# Patient Record
Sex: Female | Born: 1950
Health system: Southern US, Community
[De-identification: ages and names within clinical notes are randomized; demographics above are authoritative.]

## PROBLEM LIST (undated history)

## (undated) DIAGNOSIS — I10 Essential (primary) hypertension: Secondary | ICD-10-CM

## (undated) DIAGNOSIS — D649 Anemia, unspecified: Secondary | ICD-10-CM

## (undated) DIAGNOSIS — Z9289 Personal history of other medical treatment: Secondary | ICD-10-CM

## (undated) DIAGNOSIS — D219 Benign neoplasm of connective and other soft tissue, unspecified: Secondary | ICD-10-CM

## (undated) DIAGNOSIS — F329 Major depressive disorder, single episode, unspecified: Secondary | ICD-10-CM

## (undated) DIAGNOSIS — G4730 Sleep apnea, unspecified: Secondary | ICD-10-CM

## (undated) DIAGNOSIS — R7303 Prediabetes: Secondary | ICD-10-CM

## (undated) DIAGNOSIS — F32A Depression, unspecified: Secondary | ICD-10-CM

## (undated) DIAGNOSIS — E049 Nontoxic goiter, unspecified: Secondary | ICD-10-CM

## (undated) HISTORY — PX: COLONOSCOPY: SHX174

## (undated) HISTORY — DX: Benign neoplasm of connective and other soft tissue, unspecified: D21.9

## (undated) HISTORY — PX: TUBAL LIGATION: SHX77

---

## 1999-02-09 ENCOUNTER — Other Ambulatory Visit: Admission: RE | Admit: 1999-02-09 | Discharge: 1999-02-09 | Payer: Self-pay | Admitting: Obstetrics and Gynecology

## 1999-04-02 ENCOUNTER — Encounter: Payer: Self-pay | Admitting: Obstetrics and Gynecology

## 1999-04-02 ENCOUNTER — Ambulatory Visit (HOSPITAL_COMMUNITY): Admission: RE | Admit: 1999-04-02 | Discharge: 1999-04-02 | Payer: Self-pay | Admitting: Obstetrics and Gynecology

## 2000-03-09 ENCOUNTER — Other Ambulatory Visit: Admission: RE | Admit: 2000-03-09 | Discharge: 2000-03-09 | Payer: Self-pay | Admitting: Obstetrics and Gynecology

## 2000-03-10 ENCOUNTER — Other Ambulatory Visit: Admission: RE | Admit: 2000-03-10 | Discharge: 2000-03-10 | Payer: Self-pay | Admitting: Obstetrics and Gynecology

## 2000-03-10 ENCOUNTER — Encounter (INDEPENDENT_AMBULATORY_CARE_PROVIDER_SITE_OTHER): Payer: Self-pay | Admitting: Specialist

## 2000-03-30 ENCOUNTER — Encounter: Payer: Self-pay | Admitting: Obstetrics and Gynecology

## 2000-03-30 ENCOUNTER — Ambulatory Visit (HOSPITAL_COMMUNITY): Admission: RE | Admit: 2000-03-30 | Discharge: 2000-03-30 | Payer: Self-pay | Admitting: Obstetrics and Gynecology

## 2000-05-12 ENCOUNTER — Encounter: Admission: RE | Admit: 2000-05-12 | Discharge: 2000-05-12 | Payer: Self-pay | Admitting: Internal Medicine

## 2000-05-13 ENCOUNTER — Inpatient Hospital Stay (HOSPITAL_COMMUNITY): Admission: RE | Admit: 2000-05-13 | Discharge: 2000-05-14 | Payer: Self-pay | Admitting: Internal Medicine

## 2000-05-13 ENCOUNTER — Encounter: Admission: RE | Admit: 2000-05-13 | Discharge: 2000-05-13 | Payer: Self-pay | Admitting: Internal Medicine

## 2000-06-23 ENCOUNTER — Encounter: Admission: RE | Admit: 2000-06-23 | Discharge: 2000-06-23 | Payer: Self-pay | Admitting: Internal Medicine

## 2000-06-27 ENCOUNTER — Ambulatory Visit (HOSPITAL_COMMUNITY): Admission: RE | Admit: 2000-06-27 | Discharge: 2000-06-27 | Payer: Self-pay | Admitting: Internal Medicine

## 2002-02-06 ENCOUNTER — Ambulatory Visit (HOSPITAL_COMMUNITY): Admission: RE | Admit: 2002-02-06 | Discharge: 2002-02-06 | Payer: Self-pay | Admitting: Obstetrics and Gynecology

## 2002-02-06 ENCOUNTER — Encounter: Payer: Self-pay | Admitting: Obstetrics and Gynecology

## 2002-02-12 ENCOUNTER — Other Ambulatory Visit: Admission: RE | Admit: 2002-02-12 | Discharge: 2002-02-12 | Payer: Self-pay | Admitting: Obstetrics and Gynecology

## 2002-04-23 ENCOUNTER — Encounter: Admission: RE | Admit: 2002-04-23 | Discharge: 2002-04-23 | Payer: Self-pay | Admitting: Internal Medicine

## 2002-04-25 ENCOUNTER — Ambulatory Visit (HOSPITAL_COMMUNITY): Admission: RE | Admit: 2002-04-25 | Discharge: 2002-04-25 | Payer: Self-pay | Admitting: Internal Medicine

## 2002-05-23 ENCOUNTER — Encounter: Admission: RE | Admit: 2002-05-23 | Discharge: 2002-05-23 | Payer: Self-pay | Admitting: Internal Medicine

## 2004-02-10 ENCOUNTER — Ambulatory Visit (HOSPITAL_COMMUNITY): Admission: RE | Admit: 2004-02-10 | Discharge: 2004-02-10 | Payer: Self-pay | Admitting: Obstetrics and Gynecology

## 2004-02-27 ENCOUNTER — Other Ambulatory Visit: Admission: RE | Admit: 2004-02-27 | Discharge: 2004-02-27 | Payer: Self-pay | Admitting: Obstetrics and Gynecology

## 2004-03-19 ENCOUNTER — Encounter (HOSPITAL_COMMUNITY): Admission: RE | Admit: 2004-03-19 | Discharge: 2004-06-17 | Payer: Self-pay | Admitting: Internal Medicine

## 2004-04-07 ENCOUNTER — Ambulatory Visit (HOSPITAL_COMMUNITY): Admission: RE | Admit: 2004-04-07 | Discharge: 2004-04-07 | Payer: Self-pay | Admitting: Internal Medicine

## 2004-04-15 ENCOUNTER — Encounter (INDEPENDENT_AMBULATORY_CARE_PROVIDER_SITE_OTHER): Payer: Self-pay | Admitting: *Deleted

## 2004-04-15 ENCOUNTER — Ambulatory Visit (HOSPITAL_COMMUNITY): Admission: RE | Admit: 2004-04-15 | Discharge: 2004-04-15 | Payer: Self-pay | Admitting: Internal Medicine

## 2004-06-26 ENCOUNTER — Ambulatory Visit (HOSPITAL_COMMUNITY): Admission: RE | Admit: 2004-06-26 | Discharge: 2004-06-26 | Payer: Self-pay | Admitting: Gastroenterology

## 2005-03-16 ENCOUNTER — Other Ambulatory Visit: Admission: RE | Admit: 2005-03-16 | Discharge: 2005-03-16 | Payer: Self-pay | Admitting: Obstetrics and Gynecology

## 2006-01-07 ENCOUNTER — Ambulatory Visit (HOSPITAL_COMMUNITY): Admission: RE | Admit: 2006-01-07 | Discharge: 2006-01-07 | Payer: Self-pay | Admitting: Obstetrics and Gynecology

## 2006-03-23 ENCOUNTER — Other Ambulatory Visit: Admission: RE | Admit: 2006-03-23 | Discharge: 2006-03-23 | Payer: Self-pay | Admitting: Obstetrics and Gynecology

## 2007-01-11 ENCOUNTER — Ambulatory Visit (HOSPITAL_COMMUNITY): Admission: RE | Admit: 2007-01-11 | Discharge: 2007-01-11 | Payer: Self-pay | Admitting: Obstetrics and Gynecology

## 2008-01-26 ENCOUNTER — Ambulatory Visit (HOSPITAL_COMMUNITY): Admission: RE | Admit: 2008-01-26 | Discharge: 2008-01-26 | Payer: Self-pay | Admitting: Obstetrics and Gynecology

## 2008-03-13 ENCOUNTER — Encounter: Admission: RE | Admit: 2008-03-13 | Discharge: 2008-03-13 | Payer: Self-pay | Admitting: Internal Medicine

## 2008-09-30 ENCOUNTER — Ambulatory Visit (HOSPITAL_COMMUNITY): Admission: RE | Admit: 2008-09-30 | Discharge: 2008-09-30 | Payer: Self-pay | Admitting: Otolaryngology

## 2008-10-22 ENCOUNTER — Ambulatory Visit (HOSPITAL_BASED_OUTPATIENT_CLINIC_OR_DEPARTMENT_OTHER): Admission: RE | Admit: 2008-10-22 | Discharge: 2008-10-22 | Payer: Self-pay | Admitting: Otolaryngology

## 2008-10-23 ENCOUNTER — Encounter: Admission: RE | Admit: 2008-10-23 | Discharge: 2008-10-23 | Payer: Self-pay | Admitting: Otolaryngology

## 2008-10-23 ENCOUNTER — Encounter (INDEPENDENT_AMBULATORY_CARE_PROVIDER_SITE_OTHER): Payer: Self-pay | Admitting: Interventional Radiology

## 2008-10-23 ENCOUNTER — Other Ambulatory Visit: Admission: RE | Admit: 2008-10-23 | Discharge: 2008-10-23 | Payer: Self-pay | Admitting: Interventional Radiology

## 2008-10-26 ENCOUNTER — Ambulatory Visit: Payer: Self-pay | Admitting: Internal Medicine

## 2009-01-28 ENCOUNTER — Ambulatory Visit (HOSPITAL_COMMUNITY): Admission: RE | Admit: 2009-01-28 | Discharge: 2009-01-28 | Payer: Self-pay | Admitting: Obstetrics and Gynecology

## 2010-01-29 ENCOUNTER — Ambulatory Visit (HOSPITAL_COMMUNITY): Admission: RE | Admit: 2010-01-29 | Discharge: 2010-01-29 | Payer: Self-pay | Admitting: Obstetrics and Gynecology

## 2011-02-19 ENCOUNTER — Other Ambulatory Visit (HOSPITAL_COMMUNITY): Payer: Self-pay | Admitting: Internal Medicine

## 2011-02-19 DIAGNOSIS — Z1231 Encounter for screening mammogram for malignant neoplasm of breast: Secondary | ICD-10-CM

## 2011-02-24 ENCOUNTER — Ambulatory Visit (HOSPITAL_COMMUNITY)
Admission: RE | Admit: 2011-02-24 | Discharge: 2011-02-24 | Disposition: A | Discharge status: Home / Self Care | Payer: BC Managed Care – PPO | Source: Ambulatory Visit | Attending: Internal Medicine | Admitting: Internal Medicine

## 2011-02-24 DIAGNOSIS — Z1231 Encounter for screening mammogram for malignant neoplasm of breast: Secondary | ICD-10-CM | POA: Insufficient documentation

## 2011-04-27 NOTE — Procedures (Signed)
NAME:  Linda Mccoy, Linda Mccoy NO.:  [id number]   MEDICAL RECORD NO.:  [id number]          PATIENT TYPE:  OUT   LOCATION:  SLEEP CENTER                 FACILITY:  [facility]   PHYSICIAN:  Clinton D. Maple Hudson, MD, FCCP, FACPDATE OF BIRTH:  11/27[birth year]1953   DATE OF STUDY:  10/22/2008                            NOCTURNAL POLYSOMNOGRAM   REFERRING PHYSICIAN:  Hermelinda Medicus, M.D.   REFERRING PHYSICIAN:  Hermelinda Medicus, MD   INDICATION FOR STUDY:  Hypersomnia with sleep apnea.   EPWORTH SLEEPINESS SCORE:  Epworth sleepiness score 5/24.  BMI 38.4.  Weight 245 pounds.  Height 67 inches.  Neck 16 inches.   MEDICATIONS:  Home medications are charted and reviewed.   SLEEP ARCHITECTURE:  Total sleep time 230 minutes with sleep efficiency  58.3%.  Stage I is 13.2%.  Stage II is 62.5%.  Stage III absent.  REM  24.3% of total sleep time.  Sleep latency is 18 minutes.  REM latency  278 minutes.  Her wake after sleep onset 147 minutes.  Arousal index  37.6.  Sleep was fragmented and sustained sleep was delayed  approximately 1/2 hour after sleep onset.  A sustained interval of  waking was noted between 1 and 2 a.m.  No bedtime medication was taken.   RESPIRATORY DATA:  Apnea-hypopnea index (AHI) 28.1 per hour.  A total of  108 events were scored including 32 of obstructive apneas and 76  hypopneas.  Events were not positional.  REM AHI 56.8 per hour.  This  was a diagnostic sleep study without CPAP titration requested.   OXYGEN DATA:  Moderate snoring with oxygen desaturation to a nadir of  79%.  Mean oxygen saturation through the study was 93.5% on room air.   CARDIAC DATA:  Sinus rhythm with occasional PAC.   MOVEMENT/PARASOMNIA:  No significant movement disturbance.  One bathroom  trip.   IMPRESSION/RECOMMENDATION:  1. Moderate obstructive sleep apnea/hypopnea syndrome, apnea-hypopnea      index 28.1 per hour with nonpositional events; moderate snoring;      and oxygen desaturation to  a nadir of 79%.  2. Scores in this range could be appropriately addressed by continuous      positive airway pressure under the right      circumstances.  Consider if continuous positive airway pressure      titration would be useful, otherwise manage as clinically      indicated.      Clinton D. Maple Hudson, MD, [facility], FACP  Diplomate, Biomedical engineer of Sleep Medicine  Electronically Signed     CDY/MEDQ  D:  10/26/2008 14:01:17  T:  10/27/2008 02:18:30  Job:  [id number]

## 2011-04-30 NOTE — Op Note (Signed)
NAME:  Linda Mccoy, Linda Mccoy                         ACCOUNT NO.:  [id number]   MEDICAL RECORD NO.:  [id number]                   PATIENT TYPE:  AMB   LOCATION:  ENDO                                 FACILITY:  [facility]   PHYSICIAN:  Anselmo Rod, M.D.               DATE OF BIRTH:  Feb 20, 1951   DATE OF PROCEDURE:  07/01/2004  DATE OF DISCHARGE:  06/26/2004                                 OPERATIVE REPORT   PROCEDURE:  Screening colonoscopy.   ENDOSCOPIST:  Charna Elizabeth, M.D.   INSTRUMENT USED:  Olympus video colonoscope.   INDICATIONS FOR PROCEDURE:  This is a 60 year old black female undergoing  screening colonoscopy for change in bowel habits.  Rule out colonic polyps,  masses, etc.   PREPROCEDURE PREPARATION:  Informed consent was procured from the patient.  The patient fasted for eight hours prior to the procedure and prepped with a  bottle of magnesium citrate and a gallon of GOLYTELY the night prior to the  procedure.   PREPROCEDURE PHYSICAL EXAMINATION:  VITAL SIGNS:  The patient had stable  vital signs.  NECK:  Supple.  CHEST:  Clear to auscultation.  HEART:  S1 and S2 regular.  ABDOMEN:  Soft with normal bowel sounds.   DESCRIPTION OF PROCEDURE:  The patient was placed in the left lateral  decubitus position, sedated with 88g of Demerol and 10 mg of Versed  intravenously.  Once the patient was adequately sedated and maintained on  low flow oxygen and continuous cardiac monitoring, the Olympus video  colonoscope was advanced from the rectum to the cecum.  The appendiceal  orifice and the ileocecal valve were visualized and photographed.  No  masses, polyps, erosions, ulcerations or diverticula were seen.  The patient  tolerated the procedure well without immediate complications.   IMPRESSION:  Normal colonoscopy to the cecum.  No masses, polyps or  diverticula were seen.   RECOMMENDATIONS:  1. Continue a high fiber diet with liberal fluid intake.  2. Outpatient followup  in the next 2 weeks to further discuss change in     bowel habits.  3. Repeat colonoscopy in the next 10 years unless the patient develops any     abnormal symptoms.                                               Anselmo Rod, M.D.    JNM/MEDQ  D:  07/01/2004  T:  07/02/2004  Job:  [id number]   cc:   Margaretmary Bayley, M.D.  [address], [address]  [place]  [place] [address]  Fax: [phone]

## 2012-02-29 ENCOUNTER — Other Ambulatory Visit: Payer: Self-pay | Admitting: Nurse Practitioner

## 2012-02-29 ENCOUNTER — Ambulatory Visit
Admission: RE | Admit: 2012-02-29 | Discharge: 2012-02-29 | Disposition: A | Discharge status: Home / Self Care | Payer: BC Managed Care – PPO | Source: Ambulatory Visit | Attending: Nurse Practitioner | Admitting: Nurse Practitioner

## 2012-02-29 DIAGNOSIS — R059 Cough, unspecified: Secondary | ICD-10-CM

## 2012-02-29 DIAGNOSIS — R05 Cough: Secondary | ICD-10-CM

## 2012-03-13 ENCOUNTER — Other Ambulatory Visit (HOSPITAL_COMMUNITY): Payer: Self-pay | Admitting: Internal Medicine

## 2012-03-13 DIAGNOSIS — Z1231 Encounter for screening mammogram for malignant neoplasm of breast: Secondary | ICD-10-CM

## 2012-03-28 ENCOUNTER — Ambulatory Visit
Admission: RE | Admit: 2012-03-28 | Discharge: 2012-03-28 | Disposition: A | Discharge status: Home / Self Care | Payer: BC Managed Care – PPO | Source: Ambulatory Visit | Attending: Nurse Practitioner | Admitting: Nurse Practitioner

## 2012-03-28 ENCOUNTER — Other Ambulatory Visit: Payer: Self-pay | Admitting: Nurse Practitioner

## 2012-03-28 DIAGNOSIS — R05 Cough: Secondary | ICD-10-CM

## 2012-03-28 DIAGNOSIS — R059 Cough, unspecified: Secondary | ICD-10-CM

## 2012-04-06 ENCOUNTER — Ambulatory Visit (HOSPITAL_COMMUNITY)
Admission: RE | Admit: 2012-04-06 | Discharge: 2012-04-06 | Disposition: A | Discharge status: Home / Self Care | Payer: BC Managed Care – PPO | Source: Ambulatory Visit | Attending: Internal Medicine | Admitting: Internal Medicine

## 2012-04-06 DIAGNOSIS — Z1231 Encounter for screening mammogram for malignant neoplasm of breast: Secondary | ICD-10-CM

## 2013-02-01 ENCOUNTER — Encounter: Payer: Self-pay | Admitting: Obstetrics and Gynecology

## 2013-02-05 ENCOUNTER — Encounter: Payer: Self-pay | Admitting: Obstetrics and Gynecology

## 2013-02-05 ENCOUNTER — Ambulatory Visit: Payer: BC Managed Care – PPO | Admitting: Obstetrics and Gynecology

## 2013-02-05 VITALS — BP 104/72 | Resp 16 | Wt 254.0 lb

## 2013-02-05 DIAGNOSIS — I10 Essential (primary) hypertension: Secondary | ICD-10-CM

## 2013-02-05 DIAGNOSIS — Z01419 Encounter for gynecological examination (general) (routine) without abnormal findings: Secondary | ICD-10-CM

## 2013-02-05 DIAGNOSIS — Z124 Encounter for screening for malignant neoplasm of cervix: Secondary | ICD-10-CM

## 2013-02-05 NOTE — Progress Notes (Signed)
Subjective:    Linda Mccoy is a 62 y.o. female [id number] who presents for annual exam. The patient complaints of stress at her job.  She can retire at age 66.  The following portions of the patient's history were reviewed and updated as appropriate: allergies, current medications, past family history, past medical history, past social history, past surgical history and problem list.  Review of Systems Pertinent items are noted in HPI. Gastrointestinal:No change in bowel habits, no abdominal pain, no rectal bleeding Genitourinary:negative for dysuria, frequency, hematuria, nocturia and urinary incontinence    Objective:     BP 104/72  Resp 16  Wt 254 lb (115.214 kg)  LMP 12/26/2010  Weight:  Wt Readings from Last 1 Encounters:  02/05/13 254 lb (115.214 kg)     BMI: There is no height on file to calculate BMI. General Appearance: Alert, appropriate appearance for age. No acute distress HEENT: Grossly normal Neck / Thyroid: Supple, no masses, nodes or enlargement Lungs: clear to auscultation bilaterally Back: No CVA tenderness Breast Exam: No masses or nodes.No dimpling, nipple retraction or discharge. Cardiovascular: Regular rate and rhythm. S1, S2, no murmur Gastrointestinal: Soft, non-tender, no masses or organomegaly  ++++++++++++++++++++++++++++++++++++++++++++++++++++++++  Pelvic Exam: External genitalia: normal general appearance Vaginal: normal without tenderness, induration or masses. Atrophic. Cervix: normal appearance Adnexa: normal bimanual exam Uterus: upper limits normal size, nontender, no masses Rectovaginal: normal rectal, no masses  ++++++++++++++++++++++++++++++++++++++++++++++++++++++++  Lymphatic Exam: Non-palpable nodes in neck, clavicular, axillary, or inguinal regions  Psychiatric: Alert and oriented, appropriate affect.      Assessment:    Normal gyn exam Menopause   Overweight or obese: Yes  Pelvic relaxation: No  Menopausal symptoms:  No. Severe: No.  hypertension   Plan:    Mammogram. Pap smear.   Follow-up:  for annual exam  The updated Pap smear screening guidelines were discussed with the patient. The patient requested that I obtain a Pap smear: Yes.  Kegel exercises discussed: No.  Proper diet and regular exercise were reviewed.  Annual mammograms recommended starting at age 23. Proper breast care was discussed.  Screening colonoscopy is recommended beginning at age 22.  Regular health maintenance was reviewed.  Sleep hygiene was discussed.  Adequate calcium and vitamin D intake was emphasized.  Leonard Schwartz M.D.   Regular Periods: no Mammogram: due in April   Monthly Breast Ex.: yes Exercise: yes "Walking"   Tetanus < 10 years: no Seatbelts: yes  NI. Bladder Functn.: no "Urgency"  Abuse at home: no  Daily BM's: yes Stressful Work: yes  Healthy Diet: no bread and sweets Sigmoid-Colonoscopy: "per pt almost 10 yrs ago.   Calcium: no Medical problems this year: None per pt.    LAST PAP:04/07/2009 'WNL"   Contraception: None   Mammogram:  04/07/2012  PCP: [name], MD   PMH: No Changes  FMH: No Changes  Last Bone Scan: Done w/ [name] pt cannot recall when "WNL"

## 2013-02-06 LAB — PAP IG W/ RFLX HPV ASCU

## 2013-03-07 ENCOUNTER — Other Ambulatory Visit (HOSPITAL_COMMUNITY): Payer: Self-pay | Admitting: Internal Medicine

## 2013-03-07 DIAGNOSIS — Z1231 Encounter for screening mammogram for malignant neoplasm of breast: Secondary | ICD-10-CM

## 2013-03-09 ENCOUNTER — Ambulatory Visit (HOSPITAL_COMMUNITY): Payer: BC Managed Care – PPO

## 2013-04-09 ENCOUNTER — Ambulatory Visit (HOSPITAL_COMMUNITY)
Admission: RE | Admit: 2013-04-09 | Discharge: 2013-04-09 | Disposition: A | Discharge status: Home / Self Care | Payer: BC Managed Care – PPO | Source: Ambulatory Visit | Attending: Internal Medicine | Admitting: Internal Medicine

## 2013-04-09 DIAGNOSIS — Z1231 Encounter for screening mammogram for malignant neoplasm of breast: Secondary | ICD-10-CM

## 2013-08-13 ENCOUNTER — Emergency Department (HOSPITAL_COMMUNITY): Payer: BC Managed Care – PPO

## 2013-08-13 ENCOUNTER — Emergency Department (HOSPITAL_COMMUNITY)
Admission: EM | Admit: 2013-08-13 | Discharge: 2013-08-13 | Disposition: A | Discharge status: Home / Self Care | Payer: BC Managed Care – PPO | Attending: Emergency Medicine | Admitting: Emergency Medicine

## 2013-08-13 ENCOUNTER — Encounter (HOSPITAL_COMMUNITY): Payer: Self-pay | Admitting: *Deleted

## 2013-08-13 DIAGNOSIS — R209 Unspecified disturbances of skin sensation: Secondary | ICD-10-CM | POA: Insufficient documentation

## 2013-08-13 DIAGNOSIS — R202 Paresthesia of skin: Secondary | ICD-10-CM

## 2013-08-13 DIAGNOSIS — I10 Essential (primary) hypertension: Secondary | ICD-10-CM | POA: Insufficient documentation

## 2013-08-13 DIAGNOSIS — R112 Nausea with vomiting, unspecified: Secondary | ICD-10-CM | POA: Insufficient documentation

## 2013-08-13 DIAGNOSIS — R413 Other amnesia: Secondary | ICD-10-CM | POA: Insufficient documentation

## 2013-08-13 DIAGNOSIS — R110 Nausea: Secondary | ICD-10-CM

## 2013-08-13 DIAGNOSIS — R55 Syncope and collapse: Secondary | ICD-10-CM | POA: Insufficient documentation

## 2013-08-13 DIAGNOSIS — Z8249 Family history of ischemic heart disease and other diseases of the circulatory system: Secondary | ICD-10-CM | POA: Insufficient documentation

## 2013-08-13 DIAGNOSIS — Z8742 Personal history of other diseases of the female genital tract: Secondary | ICD-10-CM | POA: Insufficient documentation

## 2013-08-13 DIAGNOSIS — Z880 Allergy status to penicillin: Secondary | ICD-10-CM | POA: Insufficient documentation

## 2013-08-13 DIAGNOSIS — E119 Type 2 diabetes mellitus without complications: Secondary | ICD-10-CM | POA: Insufficient documentation

## 2013-08-13 DIAGNOSIS — Q054 Unspecified spina bifida with hydrocephalus: Secondary | ICD-10-CM | POA: Insufficient documentation

## 2013-08-13 DIAGNOSIS — D649 Anemia, unspecified: Secondary | ICD-10-CM | POA: Insufficient documentation

## 2013-08-13 DIAGNOSIS — R42 Dizziness and giddiness: Secondary | ICD-10-CM | POA: Insufficient documentation

## 2013-08-13 DIAGNOSIS — IMO0002 Reserved for concepts with insufficient information to code with codable children: Secondary | ICD-10-CM

## 2013-08-13 DIAGNOSIS — Z79899 Other long term (current) drug therapy: Secondary | ICD-10-CM | POA: Insufficient documentation

## 2013-08-13 DIAGNOSIS — Z7982 Long term (current) use of aspirin: Secondary | ICD-10-CM | POA: Insufficient documentation

## 2013-08-13 HISTORY — DX: Essential (primary) hypertension: I10

## 2013-08-13 LAB — URINALYSIS, ROUTINE W REFLEX MICROSCOPIC
Nitrite: NEGATIVE
Specific Gravity, Urine: 1.019 (ref 1.005–1.030)
Urobilinogen, UA: 0.2 mg/dL (ref 0.0–1.0)

## 2013-08-13 LAB — COMPREHENSIVE METABOLIC PANEL
ALT: 14 U/L (ref 0–35)
Albumin: 3.5 g/dL (ref 3.5–5.2)
Calcium: 9.6 mg/dL (ref 8.4–10.5)
GFR calc Af Amer: >90 mL/min (ref >90)
Glucose, Bld: 110 mg/dL — ABNORMAL HIGH (ref 70–99)
Sodium: 137 mEq/L (ref 135–145)
Total Protein: 8.1 g/dL (ref 6.0–8.3)

## 2013-08-13 LAB — CBC
HCT: 39.2 % (ref 36.0–46.0)
MCH: 20.2 pg — ABNORMAL LOW (ref 26.0–34.0)
MCHC: 33.2 g/dL (ref 30.0–36.0)
MCV: 61.1 fL — ABNORMAL LOW (ref 78.0–100.0)
Platelets: 274 10*3/uL (ref 150–400)
RDW: 18.1 % — ABNORMAL HIGH (ref 11.5–15.5)

## 2013-08-13 LAB — LIPASE, BLOOD: Lipase: 16 U/L (ref 11–59)

## 2013-08-13 LAB — POCT I-STAT TROPONIN I: Troponin i, poc: 0 ng/mL (ref 0.00–0.08)

## 2013-08-13 MED ORDER — MECLIZINE HCL 25 MG PO TABS
25.0000 mg | ORAL_TABLET | Freq: Once | ORAL | Status: AC
  Administered 2013-08-13: 25 mg via ORAL
  Filled 2013-08-13: qty 1

## 2013-08-13 MED ORDER — SODIUM CHLORIDE 0.9 % IV SOLN
Freq: Once | INTRAVENOUS | Status: AC
  Administered 2013-08-13: 10:00:00 via INTRAVENOUS

## 2013-08-13 MED ORDER — MECLIZINE HCL 25 MG PO TABS: 25.0000 mg | ORAL_TABLET | Freq: Four times a day (QID) | ORAL | Status: DC

## 2013-08-13 MED ORDER — ONDANSETRON HCL 4 MG/2ML IJ SOLN
4.0000 mg | Freq: Once | INTRAMUSCULAR | Status: AC
  Administered 2013-08-13: 4 mg via INTRAVENOUS
  Filled 2013-08-13: qty 2

## 2013-08-13 NOTE — ED Provider Notes (Signed)
CSN: [id number]     Arrival date & time 08/13/13  0912 History   First MD Initiated Contact with Patient 08/13/13 [phone]     No chief complaint on file.  (Consider location/radiation/quality/duration/timing/severity/associated sxs/prior Treatment) The history is provided by the patient. No language interpreter was used.    Linda Mccoy is a 62 year old female with a past medical history of hypertension, anemia, fibroids, borderline diabetic with a positive family history of stroke and heart attack presents today with chief complaint of nausea, vertiginous symptoms and left arm paresthesia since Friday.  Patient states that Friday she came home and ate dinner.  She had amnesia immediate nausea and left arm and tingling. She denies any chest pain, shortness of breath, diaphoresis.  Patient states that her left arm tingling has been constant since onset Friday (3 days ago), however the nausea has been somewhat intermittent.  She states that her left arm paresthesia is worse when lying flat, she also endorses vertiginous symptoms when lying flat with room spinning.  She has lightheadedness and presyncopal feelings when standing.  Her symptoms are relieved with exertion and movement.  She denies a history of neck problems.  She has had multiple episodes of vomiting nonbloody nonbilious vomitus. Denies headache, visual disturbance, unilateral weakness, facial asymmetry, difficulty with speech, change in gait, difficulty swallowing, confusion. The patient is mildly nauseated today and continues to have paresthesia. Denies fevers, chills, myalgias, arthralgias.Denies dysuria, flank pain, suprapubic pain, frequency, urgency, or hematuria.  Denies abdominal pain,diarrhea or constipation.   Past Medical History  Diagnosis Date  . Fibroids   . Hypertension    Past Surgical History  Procedure Laterality Date  . Tubal ligation     Family History  Problem Relation Age of Onset  . Diabetes Mother   .  Hypertension Mother   . Alcohol abuse Mother    History  Substance Use Topics  . Smoking status: Never Smoker   . Smokeless tobacco: Not on file  . Alcohol Use: Yes     Comment: socially    OB History   Grav Para Term Preterm Abortions TAB SAB Ect Mult Living   3 3 3       3      Review of Systems Ten systems reviewed and are negative for acute change, except as noted in the HPI.    Allergies  Penicillins  Home Medications   Current Outpatient Rx  Name  Route  Sig  Dispense  Refill  . aspirin EC 81 MG tablet   Oral   Take 81 mg by mouth daily.         Marland Kitchen lisinopril-hydrochlorothiazide (PRINZIDE,ZESTORETIC) 20-25 MG per tablet   Oral   Take 1 tablet by mouth daily.          BP 142/78  Pulse 79  Temp(Src) 98.2 F (36.8 C) (Oral)  Resp 18  SpO2 100%  LMP 12/26/2010 Physical Exam Physical Exam  Nursing note and vitals reviewed. Constitutional: She is oriented to person, place, and time. She appears well-developed and well-nourished. No distress.  HENT:  Head: Normocephalic and atraumatic.  Eyes: Conjunctivae normal and EOM are normal. Pupils are equal, round, and reactive to light. No scleral icterus.  Neck: Normal range of motion.  Markedly swollen thyroid gland. Cardiovascular: Normal rate, regular rhythm and normal heart sounds.  Exam reveals no gallop and no friction rub.   No murmur heard. Pulmonary/Chest: Effort normal and breath sounds normal. No respiratory distress.  Abdominal: Soft. Bowel sounds are  normal. She exhibits no distension and no mass. There is no tenderness. There is no guarding.  Neurological: She is alert and oriented to person, place, and time.  Speech is clear and goal oriented, follows commands Major Cranial nerves without deficit, no facial droop Normal strength in upper and lower extremities bilaterally including dorsiflexion and plantar flexion, strong and equal grip strength Sensation normal to light and sharp touch Moves  extremities without ataxia, coordination intact Normal finger to nose and rapid alternating movements Skin: Skin is warm and dry. She is not diaphoretic.    ED Course  Procedures (including critical care time) Labs Review Labs Reviewed  CBC  COMPREHENSIVE METABOLIC PANEL  LIPASE, BLOOD  URINALYSIS, ROUTINE W REFLEX MICROSCOPIC  POCT I-STAT TROPONIN I   Imaging Review Dg Abd Acute W/chest  08/13/2013   *RADIOLOGY REPORT*  Clinical Data: Nausea and vomiting.  ACUTE ABDOMEN SERIES (ABDOMEN 2 VIEW & CHEST 1 VIEW)  Comparison: PA and lateral chest 03/28/2012.  Findings: Single view of the chest demonstrates clear lungs and normal heart size.  Prominent epicardial fat at the right cardiophrenic angle is noted.  Two views of the abdomen show no free intraperitoneal air.  The bowel gas pattern is normal.  No abnormal abdominal calcification is identified.  No focal bony abnormality.  IMPRESSION: Negative exam.   Original Report Authenticated By: Holley Dexter, M.D.    MDM  No diagnosis found. 10:24 AM Filed Vitals:   08/13/13 0919  BP: 142/78  Pulse: 79  Temp: 98.2 F (36.8 C)  Resp: 18   Patient with symptoms concerning for probable possible acute coronary syndrome versus stroke.  She is markedly thyromegaly.  Review of chart shows fine needle aspiration done in 2009 that showed hyperplastic nodules.  Patient states she has had her thyroid checked by her primary care physician and does not have any thyroid disease.  Her ABCD to risk score is 3 point pending at at moderate risk (3 points;According to the validation study, 0-3 points: Low Risk.nn2-Day Stroke Risk: 1.0%., 7-Day Stroke Risk: 1.2%.,90-Day Stroke Risk: 3.1%.) Patient will be given Zofran for pain.  X-ray is negative for any acute abnormality.  Personally reviewed images using our PACs system.  EKG on concerning for acute ischemia.  She does have some T-wave abnormalities and no previous EKG to compare.  Patient has had ongoing  symptoms since the past 3 days however her first troponin is negative which makes my suspicion for acute coronary syndrome low     I have spoken with Dr. Thad Ranger regarding the patient who states that the patient is a "stroke until proven otherwise." due to her risk factors. In repeat exam her paresthesias have resolved, however she still c/o vertiginous sxs. Patient CT negative for acute abnormality. i have ordered MRI .   Patient MRI shows Chiari malforamtion without any other acute abnormality or stroke.  On repeat neuro exam the patient still has no focal neuro deficits. She does continue to c/o dizziness. I feel the patient is safe for discharge and may have outpatient workup for TIA. D.c with meclizine.  The patient appears reasonably screened and/or stabilized for discharge and I doubt any other medical condition or other [facility] requiring further screening, evaluation, or treatment in the ED at this time prior to discharge.   Arthor Captain, PA-C 08/13/13 1921

## 2013-08-13 NOTE — ED Notes (Signed)
Pt knows we need a urine sample but is unable at this time 

## 2013-08-13 NOTE — ED Notes (Signed)
Pt states that she thinks she had panic attack on Friday and has been in bed for 2 days.  Pt reports tingling in left arm and nauseated.  No chest pain.

## 2013-08-13 NOTE — ED Notes (Signed)
Pt reporting dizziness after ambulating to restroom.

## 2013-08-13 NOTE — ED Notes (Addendum)
Meal ordred from patient.  Pt is aware she is going to mri in about an hour. She denies having any metal in her body. She denies having a problem with tight places

## 2013-08-13 NOTE — ED Notes (Signed)
Pt ambulated to restroom with no difficulty.

## 2013-08-13 NOTE — ED Notes (Signed)
Patient transported to X-ray 

## 2013-08-14 LAB — URINE CULTURE: Colony Count: 90000

## 2013-08-16 NOTE — ED Provider Notes (Signed)
  Medical screening examination/treatment/procedure(s) were performed by non-physician practitioner and as supervising physician I was immediately available for consultation/collaboration.    Gerhard Munch, MD 08/16/13 [phone]

## 2013-09-03 ENCOUNTER — Ambulatory Visit (INDEPENDENT_AMBULATORY_CARE_PROVIDER_SITE_OTHER): Payer: BC Managed Care – PPO | Admitting: Neurology

## 2013-09-03 ENCOUNTER — Encounter: Payer: Self-pay | Admitting: Neurology

## 2013-09-03 VITALS — BP 116/78 | HR 84 | Temp 98.0°F | Ht 66.5 in | Wt 251.0 lb

## 2013-09-03 DIAGNOSIS — G459 Transient cerebral ischemic attack, unspecified: Secondary | ICD-10-CM

## 2013-09-03 LAB — LIPID PANEL: Total CHOL/HDL Ratio: 2

## 2013-09-03 NOTE — Progress Notes (Addendum)
NEUROLOGY CONSULTATION NOTE  Linda Mccoy MRN: [id number] DOB: 08-23-1951  Referring provider: ED referral Primary care provider: Dr. Renae Gloss  Reason for consult:  Transient left arm numbness and vertigo  HISTORY OF PRESENT ILLNESS: Linda Mccoy is a 62 year old left-handed woman with a past medical history of thyromegaly, hypertension, borderline diabetes, anemia and fibroid, who presents for evaluation of left upper extremity paresthesia.  Records and images were personally reviewed where available.    She has longstanding history of spinning sensation when laying supine (not when head turned on either side).  However, three weeks ago, she had worsening of vertigo, along with nausea, lightheadedness when she stood up, and numbness and heaviness of the left arm.  She denied headache, neck pain, visual disturbance, dysphagia, dysarthria, or gait instability.When symptoms didn't improve 2-3 days later, she presented to the ED on 08/13/13.  In the ED, an acute abdomen series was performed, which was normal.  Basic labs were not revealing.  ECG revealed normal sinus rhythm.  CT Heads was unremarkable.  MRI of brain was performed, which revealed a Chiari malformation, with cerebellar tonsils extending 10 mm below foramen magnum with mild impaction.  No stroke.  She was given meclizine and discharged home.  The symptoms of severe vertigo, nausea and left arm numbness soon resolved.  She is back at baseline, still with constant vertigo when she lays supine.  She denies personal history of migraines.  She does have history of thyromegaly and had a fine needle aspiration performed in 2009, which revealed hypoplastic nodules.  It is checked by her PCP.  She has a family history of stroke and heart attack.  She endorses that her diet isn't great and she doesn't exercise.  She does not smoke.  PAST MEDICAL HISTORY: Past Medical History  Diagnosis Date  . Fibroids   . Hypertension     PAST SURGICAL  HISTORY: Past Surgical History  Procedure Laterality Date  . Tubal ligation      MEDICATIONS: Current Outpatient Prescriptions on File Prior to Visit  Medication Sig Dispense Refill  . aspirin EC 81 MG tablet Take 81 mg by mouth daily.      Marland Kitchen lisinopril-hydrochlorothiazide (PRINZIDE,ZESTORETIC) 20-25 MG per tablet Take 1 tablet by mouth daily.      . meclizine (ANTIVERT) 25 MG tablet Take 1 tablet (25 mg total) by mouth 4 (four) times daily.  28 tablet  0   No current facility-administered medications on file prior to visit.    ALLERGIES: Allergies  Allergen Reactions  . Penicillins Hives    FAMILY HISTORY: Family History  Problem Relation Age of Onset  . Diabetes Mother   . Hypertension Mother   . Alcohol abuse Mother     SOCIAL HISTORY: History   Social History  . Marital Status: Widowed    Spouse Name: N/A    Number of Children: N/A  . Years of Education: N/A   Occupational History  . Not on file.   Social History Main Topics  . Smoking status: Never Smoker   . Smokeless tobacco: Never Used  . Alcohol Use: Yes     Comment: socially   . Drug Use: No  . Sexual Activity: Not Currently    Partners: Male    Birth Control/ Protection: Post-menopausal, Surgical     Comment: BTL    Other Topics Concern  . Not on file   Social History Narrative  . No narrative on file    REVIEW OF SYSTEMS:  Constitutional: No fevers, chills, or sweats, no generalized fatigue, change in appetite Eyes: No visual changes, double vision, eye pain Ear, nose and throat: No hearing loss, ear pain, nasal congestion, sore throat Cardiovascular: No chest pain, palpitations Respiratory:  No shortness of breath at rest or with exertion, wheezes GastrointestinaI: No nausea, vomiting, diarrhea, abdominal pain, fecal incontinence Genitourinary:  No dysuria, urinary retention or frequency Musculoskeletal:  No neck pain, back pain Integumentary: No rash, pruritus, skin  lesions Neurological: as above Psychiatric: No depression, insomnia, anxiety Endocrine: No palpitations, fatigue, diaphoresis, mood swings, change in appetite, change in weight, increased thirst Hematologic/Lymphatic:  No anemia, purpura, petechiae. Allergic/Immunologic: no itchy/runny eyes, nasal congestion, recent allergic reactions, rashes  PHYSICAL EXAM: Filed Vitals:   09/03/13 0746  BP: 116/78  Pulse: 84  Temp: 98 F (36.7 C)   General: No acute distress Head:  Normocephalic/atraumatic Neck: supple, no paraspinal tenderness, full range of motion Back: No paraspinal tenderness Heart: regular rate and rhythm Lungs: Clear to auscultation bilaterally. Vascular: No carotid bruits. Neurological Exam: Mental status: alert and oriented to person, place, and time, speech fluent and not dysarthric, language intact. Cranial nerves: CN I: not tested CN II: pupils equal, round and reactive to light, visual fields intact, fundi unremarkable. CN III, IV, VI:  full range of motion, no nystagmus, no ptosis CN V: facial sensation intact CN VII: upper and lower face symmetric CN VIII: hearing intact CN IX, X: gag intact, uvula midline CN XI: sternocleidomastoid and trapezius muscles intact CN XII: tongue midline Bulk & Tone: normal, no fasciculations. Motor: 5/5 throughout Sensation: pinprick and vibration intact Deep Tendon Reflexes: 1+ throughout except absent in ankles, toes down Finger to nose testing: normal without dysmetria Heel to shin: normal without dysmetria Gait: mild waddling gait but no ataxia.  Able to walk in tandem. Romberg negative.  IMPRESSION & PLAN: 1.  Transient episode of increased vertigo and left arm numbness.  Possible transient ischemic attack 2.  Chiari malformation.  Likely asymptomatic and not contributing to symptoms, given her symptoms were isolated and transient.  -  Will continue asa 81mg  daily.  Option was to switch to Plavix, but this would be only  a lateral change, as Plavix has not been shown to be significantly more beneficial than aspirin in secondary stroke prevention, especially when taking into account increased risk for bleeding.  Changing to full-strength ASA has not shown to be significantly more beneficial than 81mg .  She wishes to remain on ASA 81mg  daily. -  Check 2D Echo -  Carotid doppler -  Fasting lipid panel (LDL goal should be <100) and Hgb A1c. -  Encouraged regular exercise -  Mediterranean diet -  Follow up in 4 weeks to review results  Thank you for allowing me to take part in the care of this patient.  Shon Millet, DO  CC:  Andi Devon, MD

## 2013-09-03 NOTE — Patient Instructions (Addendum)
You probably had a mild stroke, that was missed by MRI (no actual stroke seen). 1.  Continue aspirin 81mg  daily 2.  Echocardiogram 4.  Carotid doppler 5.  We will check cholesterol and blood sugar 6.  Important to maintain blood pressure control 7.  Regular exercise 8.  Mediterranean diet (fish, vegetables, grains).   9.  Follow up in one month.  Your carotid doppler study will be done at [facility] located at [facility] in [place]; third floor.  Your appointment is Wednesday, September 24th at 1:30 pm. Please arrive at 1:15 pm.   [phone].  Your 2 D Echo is scheduled at [facility] on Thursday, September 25th at 10:00 am.  Please arrive at first floor radiology fifteen minutes prior to your appointment.  Enter the hospital at Entrance A off of [address].   [phone]

## 2013-09-06 ENCOUNTER — Ambulatory Visit (HOSPITAL_COMMUNITY)
Admission: RE | Admit: 2013-09-06 | Discharge: 2013-09-06 | Disposition: A | Discharge status: Home / Self Care | Payer: BC Managed Care – PPO | Source: Ambulatory Visit | Attending: Neurology | Admitting: Neurology

## 2013-09-06 DIAGNOSIS — I379 Nonrheumatic pulmonary valve disorder, unspecified: Secondary | ICD-10-CM | POA: Insufficient documentation

## 2013-09-06 DIAGNOSIS — I10 Essential (primary) hypertension: Secondary | ICD-10-CM

## 2013-09-06 DIAGNOSIS — I059 Rheumatic mitral valve disease, unspecified: Secondary | ICD-10-CM | POA: Insufficient documentation

## 2013-09-06 DIAGNOSIS — I079 Rheumatic tricuspid valve disease, unspecified: Secondary | ICD-10-CM | POA: Insufficient documentation

## 2013-09-06 DIAGNOSIS — G459 Transient cerebral ischemic attack, unspecified: Secondary | ICD-10-CM | POA: Insufficient documentation

## 2013-09-10 ENCOUNTER — Encounter (INDEPENDENT_AMBULATORY_CARE_PROVIDER_SITE_OTHER): Payer: BC Managed Care – PPO

## 2013-09-10 DIAGNOSIS — R42 Dizziness and giddiness: Secondary | ICD-10-CM

## 2013-09-10 DIAGNOSIS — I6529 Occlusion and stenosis of unspecified carotid artery: Secondary | ICD-10-CM

## 2013-09-10 DIAGNOSIS — R209 Unspecified disturbances of skin sensation: Secondary | ICD-10-CM

## 2013-09-10 DIAGNOSIS — G459 Transient cerebral ischemic attack, unspecified: Secondary | ICD-10-CM

## 2013-09-12 ENCOUNTER — Telehealth: Payer: Self-pay | Admitting: Neurology

## 2013-09-12 NOTE — Telephone Encounter (Signed)
Message copied by Benay Spice on Wed Sep 12, 2013  3:42 PM ------      Message from: JAFFE, ADAM R      Created: Tue Sep 11, 2013 12:38 PM       Carotid doppler does not show any significant blockage of the carotid arteries.      ----- Message -----         From: Antonieta Iba, MD         Sent: 09/10/2013   6:16 PM           To: Cira Servant, DO                   ------

## 2013-09-12 NOTE — Telephone Encounter (Signed)
Spoke with the patient and informed of carotid doppler results as written below. No questions or concerns voiced at this time.

## 2013-10-09 ENCOUNTER — Telehealth: Payer: Self-pay | Admitting: Neurology

## 2013-10-09 NOTE — Telephone Encounter (Signed)
Pt was scheduled for a follow up w/ Dr. Everlena Cooper on 10/11/13. Pt cancelled appt stating she is feeling better / Roanna Raider

## 2013-10-11 ENCOUNTER — Ambulatory Visit: Payer: BC Managed Care – PPO | Admitting: Neurology

## 2013-11-05 IMAGING — CR DG CHEST 2V
2 series · 2 of 2 positions shown · non-contrast
Comparison: None.

CLINICAL DATA: Cough, shortness of breath, blood in sputum

CHEST - 2 VIEW

[w chest pa]
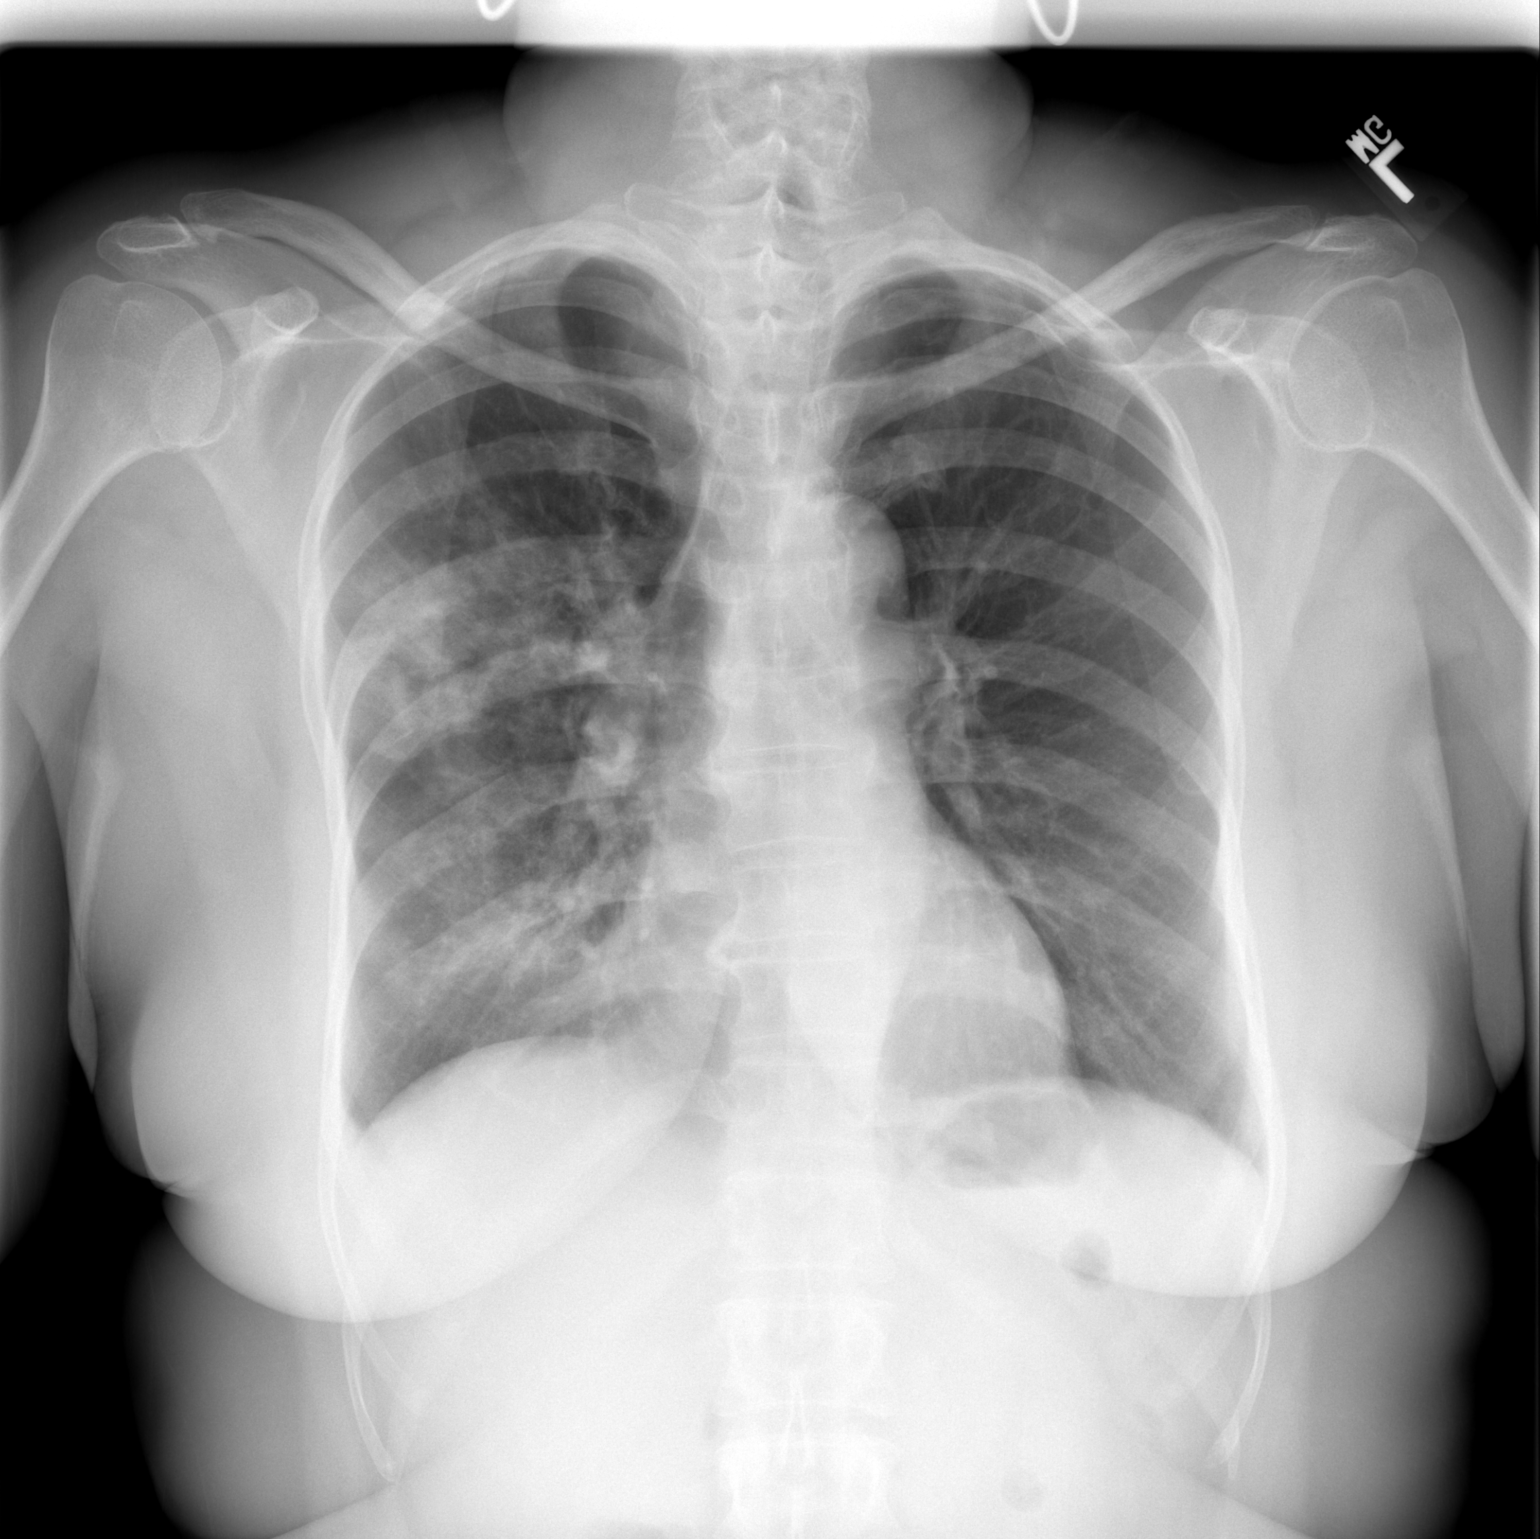

[w chest lat]
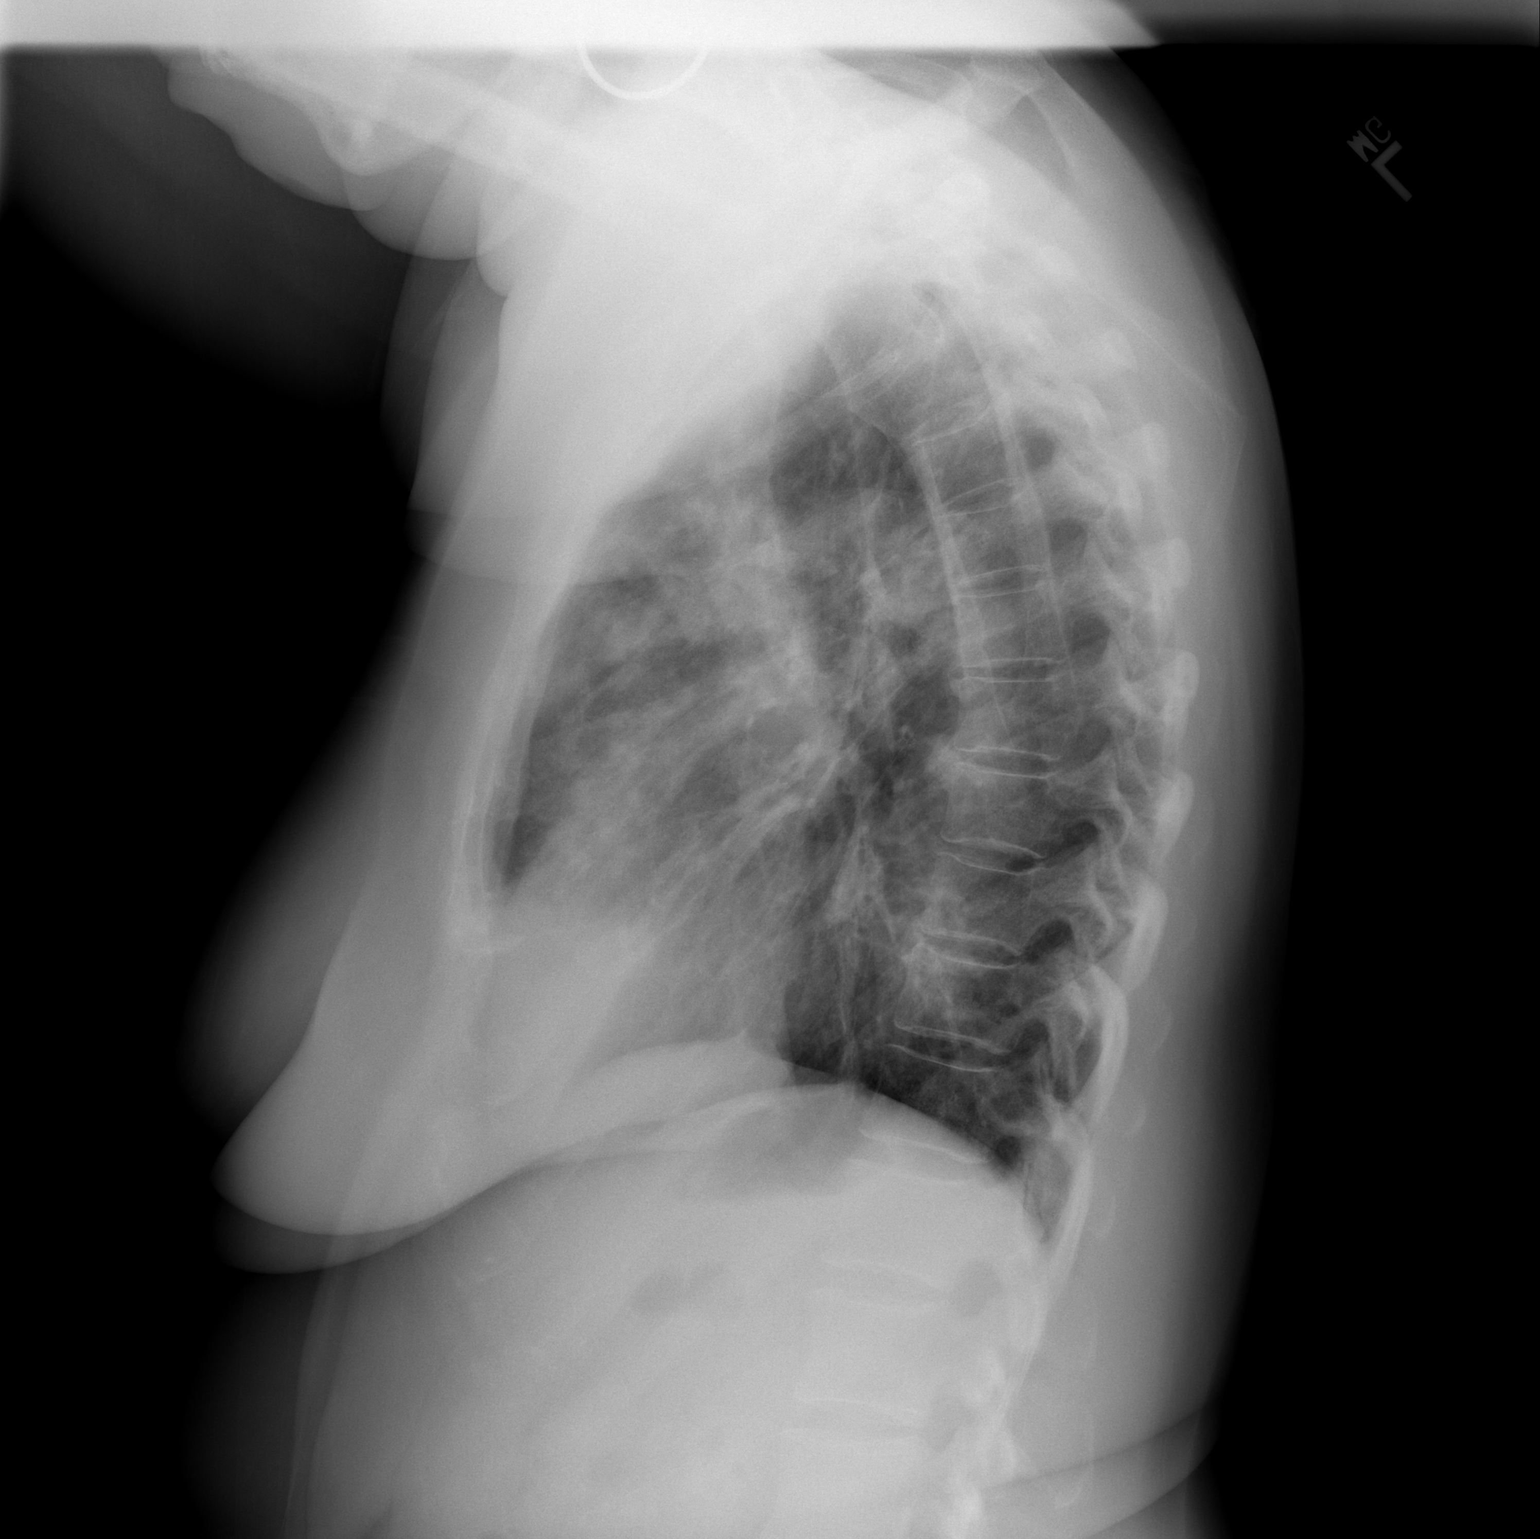

[2 of 2 positions shown; findings below may reference images not displayed]

FINDINGS: There is parenchymal opacity in the anterior right upper
lobe and possibly in the right middle lobe most consistent with
pneumonia.  Follow-up chest x-ray is recommended to ensure
clearing.  The left lung is clear.  Mediastinal contours are
normal.  The heart is within normal limits in size.  There are
degenerative changes in the lower thoracic spine.
IMPRESSION: Right upper lobe and possibly right middle lobe pneumonia.
Recommend follow-up chest x-ray to ensure clearing.

## 2014-05-21 ENCOUNTER — Other Ambulatory Visit: Payer: Self-pay | Admitting: Obstetrics and Gynecology

## 2014-05-22 ENCOUNTER — Encounter (HOSPITAL_COMMUNITY): Payer: Self-pay | Admitting: Pharmacy Technician

## 2014-05-23 ENCOUNTER — Encounter (HOSPITAL_COMMUNITY): Payer: Self-pay

## 2014-05-23 ENCOUNTER — Encounter (HOSPITAL_COMMUNITY)
Admission: RE | Admit: 2014-05-23 | Discharge: 2014-05-23 | Disposition: A | Discharge status: Home / Self Care | Payer: BC Managed Care – PPO | Source: Ambulatory Visit | Attending: Obstetrics and Gynecology | Admitting: Obstetrics and Gynecology

## 2014-05-23 ENCOUNTER — Encounter (HOSPITAL_COMMUNITY): Payer: Self-pay | Admitting: Pharmacy Technician

## 2014-05-23 DIAGNOSIS — Z01812 Encounter for preprocedural laboratory examination: Secondary | ICD-10-CM | POA: Insufficient documentation

## 2014-05-23 HISTORY — DX: Depression, unspecified: F32.A

## 2014-05-23 HISTORY — DX: Prediabetes: R73.03

## 2014-05-23 HISTORY — DX: Sleep apnea, unspecified: G47.30

## 2014-05-23 HISTORY — DX: Personal history of other medical treatment: Z92.89

## 2014-05-23 HISTORY — DX: Anemia, unspecified: D64.9

## 2014-05-23 HISTORY — DX: Major depressive disorder, single episode, unspecified: F32.9

## 2014-05-23 HISTORY — DX: Nontoxic goiter, unspecified: E04.9

## 2014-05-23 LAB — BASIC METABOLIC PANEL
BUN: 15 mg/dL (ref 6–23)
CALCIUM: 10 mg/dL (ref 8.4–10.5)
CO2: 30 meq/L (ref 19–32)
CREATININE: 0.66 mg/dL (ref 0.50–1.10)
Chloride: 99 mEq/L (ref 96–112)
GFR calc Af Amer: >90 mL/min (ref >90)
Glucose, Bld: 105 mg/dL — ABNORMAL HIGH (ref 70–99)
Potassium: 3.7 mEq/L (ref 3.7–5.3)
SODIUM: 139 meq/L (ref 137–147)

## 2014-05-23 LAB — CBC
HCT: 35.2 % — ABNORMAL LOW (ref 36.0–46.0)
Hemoglobin: 11.3 g/dL — ABNORMAL LOW (ref 12.0–15.0)
MCH: 19.9 pg — AB (ref 26.0–34.0)
MCHC: 32.1 g/dL (ref 30.0–36.0)
MCV: 62 fL — AB (ref 78.0–100.0)
PLATELETS: 270 10*3/uL (ref 150–400)
RBC: 5.68 MIL/uL — AB (ref 3.87–5.11)
RDW: 18.7 % — AB (ref 11.5–15.5)
WBC: 8.1 10*3/uL (ref 4.0–10.5)

## 2014-05-23 NOTE — Patient Instructions (Addendum)
   Your procedure is scheduled on:  Thursday, June 18  Enter through the [employer] [facility] at: 7:30 AM Pick up the phone at the desk and dial [phone] and inform us of your arrival.  Please call this number if you have any problems the morning of surgery: [phone]  Remember: Do not eat or drink  after midnight: Wednesday Take these medicines the morning of surgery with a SIP OF WATER: Lisinopril-hctz  Do not wear jewelry, make-up, or FINGER nail polish No metal in your hair or on your body. Do not wear lotions, powders, perfumes.  You may wear deodorant.  Do not bring valuables to the hospital. Contacts, dentures or bridgework may not be worn into surgery.    Patients discharged on the day of surgery will not be allowed to drive home.  Home with daughter Linda Mccoy cell [phone].

## 2014-05-29 NOTE — H&P (Signed)
Admission History and Physical Exam for a Gynecology Patient  [name] is a 63 y.o. female, [phone], who presents for hysteroscopy with resection of a submucosal polyp because of postmenopausal bleeding.  She will also have a D&C performed. She has been followed at the [facility] Gynecology division of [facility]. A sonohysterogram showed a 1.7 cm endometrial polyp.  OB History   Grav Para Term Preterm Abortions TAB SAB Ect Mult Living   3 3 3       3       Past Medical History  Diagnosis Date  . Fibroids   . Hypertension   . Goiter   . Borderline diabetes     no meds  . Sleep apnea     Does not use CPAP  . Depression     no meds  . Anemia     History  . History of blood transfusion     approx 20 yrs ago at [facility]  . SVD (spontaneous vaginal delivery)     x 3    No prescriptions prior to admission    Past Surgical History  Procedure Laterality Date  . Tubal ligation    . Colonoscopy      Allergies  Allergen Reactions  . Penicillins Hives    Family History: family history includes Alcohol abuse in her mother; Diabetes in her mother; Hypertension in her mother.  Social History:  reports that she has never smoked. She has never used smokeless tobacco. She reports that she drinks alcohol. She reports that she does not use illicit drugs.  Review of systems: See HPI.  Admission Physical Exam:  BMI equals 38.7    There is no weight on file to calculate BMI.  Last menstrual period 12/26/2010.  HEENT:                 Within normal limits Chest:                   Clear Heart:                    Regular rate and rhythm Breasts:                No masses, skin changes, bleeding, or discharge present Abdomen:             Nontender, no masses Extremities:          Grossly normal Neurologic exam: Grossly normal  Pelvic exam:  External genitalia: normal general appearance Vaginal: normal without tenderness,  induration or masses Cervix: normal appearance Adnexa: non palpable Uterus: normal size.  Difficult to outline because of obesity abdomen.  CBC    Component Value Date/Time   WBC 8.1 05/23/2014 1540   RBC 5.68* 05/23/2014 1540   HGB 11.3* 05/23/2014 1540   HCT 35.2* 05/23/2014 1540   PLT 270 05/23/2014 1540   MCV 62.0* 05/23/2014 1540   MCH 19.9* 05/23/2014 1540   MCHC 32.1 05/23/2014 1540   RDW 18.7* 05/23/2014 1540    Assessment:  Postmenopausal bleeding  Obesity  Endometrial polyp  Anemia  Hypertension  Goiter  Sleep apnea  Borderline diabetes  Plan:  The patient will undergo hysteroscopy with resection of a submucosal polyp.  She will also have a D&C.  She understands the indications for her surgical procedure.  She understands the alternative treatment options.  She accepts the risk of, but not limited to, anesthetic complications, bleeding, infection, and  possible damage to the surrounding organs.   Linda Mccoy 05/29/2014

## 2014-05-30 ENCOUNTER — Ambulatory Visit (HOSPITAL_COMMUNITY)
Admission: RE | Admit: 2014-05-30 | Discharge: 2014-05-30 | Disposition: A | Discharge status: Home / Self Care | Payer: BC Managed Care – PPO | Source: Ambulatory Visit | Attending: Obstetrics and Gynecology | Admitting: Obstetrics and Gynecology

## 2014-05-30 ENCOUNTER — Encounter (HOSPITAL_COMMUNITY): Payer: BC Managed Care – PPO | Admitting: Anesthesiology

## 2014-05-30 ENCOUNTER — Encounter (HOSPITAL_COMMUNITY): Payer: Self-pay | Admitting: Anesthesiology

## 2014-05-30 ENCOUNTER — Encounter (HOSPITAL_COMMUNITY)
Admission: RE | Disposition: A | Discharge status: Home / Self Care | Payer: Self-pay | Source: Ambulatory Visit | Attending: Obstetrics and Gynecology

## 2014-05-30 ENCOUNTER — Ambulatory Visit (HOSPITAL_COMMUNITY): Payer: BC Managed Care – PPO | Admitting: Anesthesiology

## 2014-05-30 DIAGNOSIS — R7309 Other abnormal glucose: Secondary | ICD-10-CM | POA: Insufficient documentation

## 2014-05-30 DIAGNOSIS — I10 Essential (primary) hypertension: Secondary | ICD-10-CM | POA: Insufficient documentation

## 2014-05-30 DIAGNOSIS — N840 Polyp of corpus uteri: Secondary | ICD-10-CM | POA: Insufficient documentation

## 2014-05-30 DIAGNOSIS — E049 Nontoxic goiter, unspecified: Secondary | ICD-10-CM | POA: Insufficient documentation

## 2014-05-30 DIAGNOSIS — D649 Anemia, unspecified: Secondary | ICD-10-CM

## 2014-05-30 DIAGNOSIS — E669 Obesity, unspecified: Secondary | ICD-10-CM

## 2014-05-30 DIAGNOSIS — G4730 Sleep apnea, unspecified: Secondary | ICD-10-CM

## 2014-05-30 DIAGNOSIS — N950 Postmenopausal bleeding: Secondary | ICD-10-CM

## 2014-05-30 DIAGNOSIS — Z880 Allergy status to penicillin: Secondary | ICD-10-CM | POA: Insufficient documentation

## 2014-05-30 HISTORY — PX: DILATATION & CURRETTAGE/HYSTEROSCOPY WITH RESECTOCOPE: SHX5572

## 2014-05-30 HISTORY — DX: Postmenopausal bleeding: N95.0

## 2014-05-30 HISTORY — DX: Obesity, unspecified: E66.9

## 2014-05-30 HISTORY — DX: Polyp of corpus uteri: N84.0

## 2014-05-30 HISTORY — DX: Sleep apnea, unspecified: G47.30

## 2014-05-30 LAB — GLUCOSE, CAPILLARY: GLUCOSE-CAPILLARY: 103 mg/dL — AB (ref 70–99)

## 2014-05-30 SURGERY — DILATATION & CURETTAGE/HYSTEROSCOPY WITH RESECTOCOPE
Anesthesia: General | Site: Vagina

## 2014-05-30 MED ORDER — ONDANSETRON HCL 4 MG/2ML IJ SOLN
INTRAMUSCULAR | Status: DC | PRN
  Administered 2014-05-30: 4 mg via INTRAVENOUS

## 2014-05-30 MED ORDER — PHENYLEPHRINE HCL 10 MG/ML IJ SOLN
INTRAMUSCULAR | Status: DC | PRN
  Administered 2014-05-30 (×2): 40 ug via INTRAVENOUS
  Administered 2014-05-30: 80 ug via INTRAVENOUS

## 2014-05-30 MED ORDER — LIDOCAINE HCL (CARDIAC) 20 MG/ML IV SOLN
INTRAVENOUS | Status: AC
  Filled 2014-05-30: qty 5

## 2014-05-30 MED ORDER — MIDAZOLAM HCL 2 MG/2ML IJ SOLN
INTRAMUSCULAR | Status: AC
  Filled 2014-05-30: qty 2

## 2014-05-30 MED ORDER — HYDROCODONE-ACETAMINOPHEN 5-300 MG PO TABS: 1.0000 | ORAL_TABLET | ORAL | Status: DC | PRN

## 2014-05-30 MED ORDER — BUPIVACAINE-EPINEPHRINE (PF) 0.5% -1:200000 IJ SOLN
INTRAMUSCULAR | Status: AC
  Filled 2014-05-30: qty 30

## 2014-05-30 MED ORDER — BUPIVACAINE-EPINEPHRINE 0.5% -1:200000 IJ SOLN
INTRAMUSCULAR | Status: DC | PRN
  Administered 2014-05-30: 10 mL

## 2014-05-30 MED ORDER — ACETAMINOPHEN 160 MG/5ML PO SOLN: 325.0000 mg | ORAL | Status: DC | PRN

## 2014-05-30 MED ORDER — ACETAMINOPHEN 325 MG PO TABS: 325.0000 mg | ORAL_TABLET | ORAL | Status: DC | PRN

## 2014-05-30 MED ORDER — GLYCINE 1.5 % IR SOLN
Status: DC | PRN
  Administered 2014-05-30: 3000 mL

## 2014-05-30 MED ORDER — MIDAZOLAM HCL 2 MG/2ML IJ SOLN
INTRAMUSCULAR | Status: DC | PRN
  Administered 2014-05-30: 2 mg via INTRAVENOUS

## 2014-05-30 MED ORDER — LACTATED RINGERS IV SOLN
INTRAVENOUS | Status: DC
  Administered 2014-05-30 (×2): via INTRAVENOUS

## 2014-05-30 MED ORDER — ONDANSETRON HCL 4 MG/2ML IJ SOLN
INTRAMUSCULAR | Status: AC
  Filled 2014-05-30: qty 2

## 2014-05-30 MED ORDER — DEXAMETHASONE SODIUM PHOSPHATE 4 MG/ML IJ SOLN
INTRAMUSCULAR | Status: DC | PRN
  Administered 2014-05-30: 6 mg via INTRAVENOUS

## 2014-05-30 MED ORDER — LIDOCAINE HCL (CARDIAC) 20 MG/ML IV SOLN
INTRAVENOUS | Status: DC | PRN
  Administered 2014-05-30: 50 mg via INTRAVENOUS

## 2014-05-30 MED ORDER — FENTANYL CITRATE 0.05 MG/ML IJ SOLN
INTRAMUSCULAR | Status: AC
  Filled 2014-05-30: qty 5

## 2014-05-30 MED ORDER — FENTANYL CITRATE 0.05 MG/ML IJ SOLN: 25.0000 ug | INTRAMUSCULAR | Status: DC | PRN

## 2014-05-30 MED ORDER — PROPOFOL 10 MG/ML IV EMUL
INTRAVENOUS | Status: AC
  Filled 2014-05-30: qty 20

## 2014-05-30 MED ORDER — KETOROLAC TROMETHAMINE 30 MG/ML IJ SOLN
INTRAMUSCULAR | Status: AC
  Filled 2014-05-30: qty 1

## 2014-05-30 MED ORDER — PROPOFOL 10 MG/ML IV EMUL
INTRAVENOUS | Status: AC
  Filled 2014-05-30: qty 40

## 2014-05-30 MED ORDER — DEXAMETHASONE SODIUM PHOSPHATE 10 MG/ML IJ SOLN
INTRAMUSCULAR | Status: AC
  Filled 2014-05-30: qty 1

## 2014-05-30 MED ORDER — IBUPROFEN 800 MG PO TABS: 800.0000 mg | ORAL_TABLET | Freq: Three times a day (TID) | ORAL | Status: DC | PRN

## 2014-05-30 MED ORDER — FENTANYL CITRATE 0.05 MG/ML IJ SOLN
INTRAMUSCULAR | Status: DC | PRN
  Administered 2014-05-30 (×3): 50 ug via INTRAVENOUS

## 2014-05-30 MED ORDER — PROPOFOL 10 MG/ML IV BOLUS
INTRAVENOUS | Status: DC | PRN
  Administered 2014-05-30: 200 mg via INTRAVENOUS

## 2014-05-30 MED ORDER — KETOROLAC TROMETHAMINE 30 MG/ML IJ SOLN
INTRAMUSCULAR | Status: DC | PRN
  Administered 2014-05-30: 30 mg via INTRAVENOUS

## 2014-05-30 MED ORDER — PHENYLEPHRINE 40 MCG/ML (10ML) SYRINGE FOR IV PUSH (FOR BLOOD PRESSURE SUPPORT)
PREFILLED_SYRINGE | INTRAVENOUS | Status: AC
  Filled 2014-05-30: qty 5

## 2014-05-30 SURGICAL SUPPLY — 20 items
CANISTER SUCT 3000ML (MISCELLANEOUS) ×2 IMPLANT
CATH ROBINSON RED A/P 16FR (CATHETERS) ×2 IMPLANT
CLOTH BEACON ORANGE TIMEOUT ST (SAFETY) ×2 IMPLANT
CONTAINER PREFILL 10% NBF 60ML (FORM) ×4 IMPLANT
DRAPE HYSTEROSCOPY (DRAPE) ×2 IMPLANT
DRSG TELFA 3X8 NADH (GAUZE/BANDAGES/DRESSINGS) IMPLANT
ELECT REM PT RETURN 9FT ADLT (ELECTROSURGICAL) ×2
ELECTRODE REM PT RTRN 9FT ADLT (ELECTROSURGICAL) IMPLANT
GLOVE BIOGEL PI IND STRL 8.5 (GLOVE) ×1 IMPLANT
GLOVE BIOGEL PI INDICATOR 8.5 (GLOVE) ×1
GLOVE ECLIPSE 8.0 STRL XLNG CF (GLOVE) ×4 IMPLANT
GOWN STRL REUS W/TWL LRG LVL3 (GOWN DISPOSABLE) ×4 IMPLANT
LOOP ANGLED CUTTING 22FR (CUTTING LOOP) IMPLANT
PACK VAGINAL MINOR WOMEN LF (CUSTOM PROCEDURE TRAY) ×2 IMPLANT
PAD DRESSING TELFA 3X8 NADH (GAUZE/BANDAGES/DRESSINGS) ×1 IMPLANT
PAD OB MATERNITY 4.3X12.25 (PERSONAL CARE ITEMS) ×2 IMPLANT
SET TUBING HYSTEROSCOPY 2 NDL (TUBING) ×1 IMPLANT
TOWEL OR 17X24 6PK STRL BLUE (TOWEL DISPOSABLE) ×4 IMPLANT
TUBE HYSTEROSCOPY W Y-CONNECT (TUBING) ×1 IMPLANT
WATER STERILE IRR 1000ML POUR (IV SOLUTION) ×2 IMPLANT

## 2014-05-30 NOTE — H&P (Signed)
The patient was interviewed and examined today.  The previously documented history and physical examination was reviewed. There are no changes. The operative procedure was reviewed. The risks and benefits were outlined again. The specific risks include, but are not limited to, anesthetic complications, bleeding, infections, and possible damage to the surrounding organs. The patient's questions were answered.  We are ready to proceed as outlined. The likelihood of the patient achieving the goals of this procedure is very likely.   BP 130/84  Pulse 85  Temp(Src) 98.1 F (36.7 C) (Oral)  Resp 18  SpO2 99%  LMP 12/26/2010  Gildardo Cranker, M.D.

## 2014-05-30 NOTE — Op Note (Signed)
OPERATIVE NOTE  Linda Mccoy  DOB:    09-03-1951  MRN:    [id number]  CSN:    [id number]  Date of Surgery:  05/30/2014  Preoperative Diagnosis:  Postmenopausal bleeding  Anemia  Endometrial polyp  Sleep apnea  Obesity  Hypertension  Postoperative Diagnosis:  Same  Procedure:  Hysteroscopy with resection of endometrial polyps Dilatation and curettage  Surgeon:  Gildardo Cranker, M.D.  Assistant:  None  Anesthetic:  General  Disposition:  The patient is a 63 y.o.-year-old female who presents with postmenopausal bleeding. Please see the above diagnosis.. She understands the indications for her surgical procedure. She accepts the risk of, but not limited to, anesthetic complications, bleeding, infections, and possible damage to the surrounding organs.  Findings:  On examination under anesthesia the uterus was 10 weeks size. No adnexal masses were appreciated. No parametrial disease was appreciated. The uterus sounded to 9 cm. The patient was noted to have 2 endometrial polyps with the largest polyp measuring approximately 1.5 cm.  Procedure:  The patient was taken to the operating room where a general anesthetic was given. The perineum and vagina were prepped with Betadine. The bladder was drained of urine. The patient was sterilely draped. Examination under anesthesia was performed. A paracervical block was placed using 10 cc of half percent Marcaine with epinephrine. An endocervical curettage was performed. The cervix was gently dilated. The operative hysteroscope was inserted. Pictures were taken. Findings included: To endometrial polyps with the largest polyp measuring 1.5 cm. The polyps were resected using a single loop. The cavity was then curetted using a sharp curet. The cavity was felt to be clean at the end of our procedure. Hemostasis was adequate. All instruments were removed. The examination was repeated and the uterus was noted to be firm.  Sponge, and needle counts were correct. The estimated blood loss for the procedure was 10 cc. The estimated fluid deficit loss 170 cc. The patient was awakened from her anesthetic without difficulty. She was returned to the supine position and transported to the recovery room in stable condition. The endocervical curettings, endometrial resections, and endometrial curettings were sent to pathology.  Followup instructions:  The patient will return to see Dr. Raphael Gibney in 2 weeks. She was given a copy of the postoperative instructions for patients who've undergone hysteroscopy.  Discharge medications:  Motrin 800 mg every 8 hours as needed for mild to moderate pain. Vicodin one tablet every 4 hours as needed for severe pain.  Gildardo Cranker, M.D.

## 2014-05-30 NOTE — Discharge Instructions (Signed)
DO NOT TAKE IBUPROFEN (ADVIL, ALEVE, OR MOTRIN) TILL AFTER 3:45 PM!  Hysteroscopy, Care After Refer to this sheet in the next few weeks. These instructions provide you with information on caring for yourself after your procedure. Your health care provider may also give you more specific instructions. Your treatment has been planned according to current medical practices, but problems sometimes occur. Call your health care provider if you have any problems or questions after your procedure.   WHAT TO EXPECT AFTER THE PROCEDURE After your procedure, it is typical to have the following:  You may have some cramping. This normally lasts for a couple days.  You may have bleeding. This can vary from light spotting for a few days to menstrual-like bleeding for 3-7 days.  HOME CARE INSTRUCTIONS  Rest for the first 1-2 days after the procedure.  Only take over-the-counter or prescription medicines as directed by your health care provider. Do not take aspirin. It can increase the chances of bleeding.  Take showers instead of baths for 2 weeks or as directed by your health care provider.  Do not drive for 24 hours or as directed.  Do not drink alcohol while taking pain medicine.  Do not use tampons, douche, or have sexual intercourse for 2 weeks or until your health care provider says it is okay.  Take your temperature twice a day for 4-5 days. Write it down each time.  Follow your health care provider's advice about diet, exercise, and lifting.  If you develop constipation, you may:  Take a mild laxative if your health care provider approves.  Add bran foods to your diet.  Drink enough fluids to keep your urine clear or pale yellow.  Try to have someone with you or available to you for the first 24-48 hours, especially if you were given a general anesthetic.  Follow up with your health care provider as directed.  SEEK MEDICAL CARE IF:  You feel dizzy or lightheaded.  You feel sick  to your stomach (nauseous).  You have abnormal vaginal discharge.  You have a rash.  You have pain that is not controlled with medicine.  SEEK IMMEDIATE MEDICAL CARE IF:  You have bleeding that is heavier than a normal menstrual period.  You have a fever.  You have increasing cramps or pain, not controlled with medicine.  You have new belly (abdominal) pain.  You pass out.  You have pain in the tops of your shoulders (shoulder strap areas).  You have shortness of breath.  Document Released: 09/19/2013 Document Reviewed: 09/19/2013

## 2014-05-30 NOTE — Anesthesia Preprocedure Evaluation (Signed)
Anesthesia Evaluation  Patient identified by MRN, date of birth, ID band Patient awake    Reviewed: Allergy & Precautions, H&P , Patient's Chart, lab work & pertinent test results, reviewed documented beta blocker date and time   Airway Mallampati: II TM Distance: >3 FB Neck ROM: full    Dental no notable dental hx.    Pulmonary sleep apnea (Dx 10 yr ago; no CPAP use, perhaps 10# weight gain since Dx) ,  breath sounds clear to auscultation  Pulmonary exam normal       Cardiovascular hypertension (Controlled), On Medications Rhythm:regular Rate:Normal     Neuro/Psych    GI/Hepatic   Endo/Other    Renal/GU      Musculoskeletal   Abdominal   Peds  Hematology   Anesthesia Other Findings   Reproductive/Obstetrics                           Anesthesia Physical Anesthesia Plan  ASA: III  Anesthesia Plan: General   Post-op Pain Management:    Induction: Intravenous  Airway Management Planned: LMA  Additional Equipment:   Intra-op Plan:   Post-operative Plan: Extubation in OR  Informed Consent: I have reviewed the patients History and Physical, chart, labs and discussed the procedure including the risks, benefits and alternatives for the proposed anesthesia with the patient or authorized representative who has indicated his/her understanding and acceptance.   Dental Advisory Given and Dental advisory given  Plan Discussed with: CRNA and Surgeon  Anesthesia Plan Comments: (  Discussed general anesthesia, including possible nausea, instrumentation of airway, sore throat,pulmonary aspiration, etc. I asked if the were any outstanding questions, or  concerns before we proceeded. )        Anesthesia Quick Evaluation

## 2014-05-30 NOTE — Anesthesia Postprocedure Evaluation (Signed)
  Anesthesia Post-op Note  Patient: Linda Mccoy  Procedure(s) Performed: Procedure(s): DILATATION & CURETTAGE/HYSTEROSCOPY WITH RESECTion of endometrial polyp (N/A) Patient is awake and responsive. Pain and nausea are reasonably well controlled. Vital signs are stable and clinically acceptable. Oxygen saturation is clinically acceptable. There are no apparent anesthetic complications at this time. Patient is ready for discharge.

## 2014-05-30 NOTE — Transfer of Care (Signed)
Immediate Anesthesia Transfer of Care Note  Patient: Linda Mccoy  Procedure(s) Performed: Procedure(s): DILATATION & CURETTAGE/HYSTEROSCOPY WITH RESECTion of endometrial polyp (N/A)  Patient Location: PACU  Anesthesia Type:General  Level of Consciousness: awake, alert  and oriented  Airway & Oxygen Therapy: Patient Spontanous Breathing and Patient connected to nasal cannula oxygen  Post-op Assessment: Report given to PACU RN and Post -op Vital signs reviewed and stable  Post vital signs: Reviewed and stable  Complications: No apparent anesthesia complications

## 2014-05-31 ENCOUNTER — Encounter (HOSPITAL_COMMUNITY): Payer: Self-pay | Admitting: Obstetrics and Gynecology

## 2014-08-12 ENCOUNTER — Other Ambulatory Visit (HOSPITAL_COMMUNITY): Payer: Self-pay | Admitting: Obstetrics and Gynecology

## 2014-08-12 DIAGNOSIS — Z1231 Encounter for screening mammogram for malignant neoplasm of breast: Secondary | ICD-10-CM

## 2014-08-16 ENCOUNTER — Ambulatory Visit (HOSPITAL_COMMUNITY)
Admission: RE | Admit: 2014-08-16 | Discharge: 2014-08-16 | Disposition: A | Discharge status: Home / Self Care | Payer: BC Managed Care – PPO | Source: Ambulatory Visit | Attending: Obstetrics and Gynecology | Admitting: Obstetrics and Gynecology

## 2014-08-16 DIAGNOSIS — Z1231 Encounter for screening mammogram for malignant neoplasm of breast: Secondary | ICD-10-CM

## 2014-10-14 ENCOUNTER — Encounter (HOSPITAL_COMMUNITY): Payer: Self-pay | Admitting: Obstetrics and Gynecology

## 2015-07-16 ENCOUNTER — Other Ambulatory Visit (HOSPITAL_COMMUNITY): Payer: Self-pay | Admitting: Obstetrics and Gynecology

## 2015-07-16 DIAGNOSIS — Z1231 Encounter for screening mammogram for malignant neoplasm of breast: Secondary | ICD-10-CM

## 2015-08-19 ENCOUNTER — Ambulatory Visit (HOSPITAL_COMMUNITY)
Admission: RE | Admit: 2015-08-19 | Discharge: 2015-08-19 | Disposition: A | Discharge status: Home / Self Care | Payer: BLUE CROSS/BLUE SHIELD | Source: Ambulatory Visit | Attending: Obstetrics and Gynecology | Admitting: Obstetrics and Gynecology

## 2015-08-19 DIAGNOSIS — Z1231 Encounter for screening mammogram for malignant neoplasm of breast: Secondary | ICD-10-CM | POA: Diagnosis not present

## 2016-10-27 ENCOUNTER — Other Ambulatory Visit: Payer: Self-pay | Admitting: Nurse Practitioner

## 2016-10-27 DIAGNOSIS — E042 Nontoxic multinodular goiter: Secondary | ICD-10-CM

## 2016-11-01 ENCOUNTER — Ambulatory Visit
Admission: RE | Admit: 2016-11-01 | Discharge: 2016-11-01 | Disposition: A | Discharge status: Home / Self Care | Payer: BLUE CROSS/BLUE SHIELD | Source: Ambulatory Visit | Attending: Nurse Practitioner | Admitting: Nurse Practitioner

## 2016-11-01 DIAGNOSIS — E042 Nontoxic multinodular goiter: Secondary | ICD-10-CM

## 2017-01-13 ENCOUNTER — Other Ambulatory Visit: Payer: Self-pay | Admitting: Internal Medicine

## 2017-01-13 DIAGNOSIS — Z1231 Encounter for screening mammogram for malignant neoplasm of breast: Secondary | ICD-10-CM

## 2017-02-03 ENCOUNTER — Ambulatory Visit: Payer: BLUE CROSS/BLUE SHIELD

## 2017-02-03 ENCOUNTER — Ambulatory Visit
Admission: RE | Admit: 2017-02-03 | Discharge: 2017-02-03 | Disposition: A | Discharge status: Home / Self Care | Payer: BLUE CROSS/BLUE SHIELD | Source: Ambulatory Visit | Attending: Internal Medicine | Admitting: Internal Medicine

## 2017-02-03 DIAGNOSIS — Z1231 Encounter for screening mammogram for malignant neoplasm of breast: Secondary | ICD-10-CM

## 2017-05-05 ENCOUNTER — Other Ambulatory Visit: Payer: Self-pay | Admitting: Nurse Practitioner

## 2017-05-05 DIAGNOSIS — E042 Nontoxic multinodular goiter: Secondary | ICD-10-CM

## 2017-11-09 ENCOUNTER — Other Ambulatory Visit: Payer: Self-pay | Admitting: Internal Medicine

## 2017-11-09 DIAGNOSIS — E2839 Other primary ovarian failure: Secondary | ICD-10-CM

## 2017-11-22 ENCOUNTER — Other Ambulatory Visit: Payer: BLUE CROSS/BLUE SHIELD

## 2017-12-08 ENCOUNTER — Ambulatory Visit
Admission: RE | Admit: 2017-12-08 | Discharge: 2017-12-08 | Disposition: A | Discharge status: Home / Self Care | Payer: BLUE CROSS/BLUE SHIELD | Source: Ambulatory Visit | Attending: Internal Medicine | Admitting: Internal Medicine

## 2017-12-08 DIAGNOSIS — E2839 Other primary ovarian failure: Secondary | ICD-10-CM

## 2018-09-20 ENCOUNTER — Other Ambulatory Visit: Payer: Self-pay | Admitting: Nurse Practitioner

## 2018-10-19 LAB — HM DIABETES EYE EXAM

## 2018-11-13 ENCOUNTER — Ambulatory Visit (INDEPENDENT_AMBULATORY_CARE_PROVIDER_SITE_OTHER): Payer: Medicare Other | Admitting: Nurse Practitioner

## 2018-11-13 ENCOUNTER — Other Ambulatory Visit (HOSPITAL_COMMUNITY)
Admission: RE | Admit: 2018-11-13 | Discharge: 2018-11-13 | Disposition: A | Discharge status: Home / Self Care | Payer: Medicare HMO | Source: Ambulatory Visit | Attending: Nurse Practitioner | Admitting: Nurse Practitioner

## 2018-11-13 ENCOUNTER — Encounter: Payer: Self-pay | Admitting: Nurse Practitioner

## 2018-11-13 VITALS — BP 124/80 | HR 78 | Temp 98.0°F | Ht 64.5 in | Wt 239.0 lb

## 2018-11-13 DIAGNOSIS — Z139 Encounter for screening, unspecified: Secondary | ICD-10-CM | POA: Insufficient documentation

## 2018-11-13 DIAGNOSIS — Z23 Encounter for immunization: Secondary | ICD-10-CM

## 2018-11-13 DIAGNOSIS — E559 Vitamin D deficiency, unspecified: Secondary | ICD-10-CM

## 2018-11-13 DIAGNOSIS — Z0000 Encounter for general adult medical examination without abnormal findings: Secondary | ICD-10-CM

## 2018-11-13 DIAGNOSIS — R7303 Prediabetes: Secondary | ICD-10-CM

## 2018-11-13 DIAGNOSIS — E042 Nontoxic multinodular goiter: Secondary | ICD-10-CM

## 2018-11-13 DIAGNOSIS — I10 Essential (primary) hypertension: Secondary | ICD-10-CM

## 2018-11-13 DIAGNOSIS — Z1231 Encounter for screening mammogram for malignant neoplasm of breast: Secondary | ICD-10-CM

## 2018-11-13 HISTORY — DX: Nontoxic multinodular goiter: E04.2

## 2018-11-13 LAB — POCT URINALYSIS DIPSTICK
Bilirubin, UA: NEGATIVE
Blood, UA: NEGATIVE
GLUCOSE UA: NEGATIVE
Ketones, UA: NEGATIVE
Leukocytes, UA: NEGATIVE
NITRITE UA: NEGATIVE
Protein, UA: NEGATIVE
SPEC GRAV UA: 1.01 (ref 1.010–1.025)
Urobilinogen, UA: 0.2 E.U./dL
pH, UA: 6.5 (ref 5.0–8.0)

## 2018-11-13 LAB — POCT UA - MICROALBUMIN
ALBUMIN/CREATININE RATIO, URINE, POC: 30
Creatinine, POC: 50 mg/dL
Microalbumin Ur, POC: 10 mg/L

## 2018-11-13 MED ORDER — LISINOPRIL-HYDROCHLOROTHIAZIDE 20-25 MG PO TABS: 1.0000 | ORAL_TABLET | Freq: Every day | ORAL | 1 refills | Status: DC

## 2018-11-13 NOTE — Progress Notes (Addendum)
Subjective:     Patient ID: Linda Mccoy , female    DOB: Oct 20, 1951 , 67 y.o.   MRN: [id number]   Chief Complaint  Patient presents with  . Medicare Wellness   HPI  Hypertension  This is a chronic problem. The current episode started more than 1 year ago. The problem is unchanged. The problem is controlled. Pertinent negatives include no anxiety. There are no associated agents to hypertension. Risk factors for coronary artery disease include sedentary lifestyle and obesity. Past treatments include ACE inhibitors and diuretics. The current treatment provides significant improvement. There are no compliance problems.  There is no history of angina or kidney disease.     Past Medical History:  Diagnosis Date  . Anemia    History  . Borderline diabetes    no meds  . Depression    no meds  . Fibroids   . Goiter   . History of blood transfusion    approx 20 yrs ago at [facility]  . Hypertension   . Sleep apnea    Does not use CPAP  . SVD (spontaneous vaginal delivery)    x 3     Family History  Problem Relation Age of Onset  . Diabetes Mother   . Hypertension Mother   . Alcohol abuse Mother      Current Outpatient Medications:  .  aspirin EC 81 MG tablet, Take 81 mg by mouth daily., Disp: , Rfl:  .  Cholecalciferol (VITAMIN D) 2000 UNITS CAPS, Take 2,000 Units by mouth daily., Disp: , Rfl:  .  lisinopril-hydrochlorothiazide (PRINZIDE,ZESTORETIC) 20-25 MG per tablet, Take 1 tablet by mouth daily., Disp: , Rfl:    Allergies  Allergen Reactions  . Penicillins Hives     Review of Systems  Constitutional: Negative.   HENT: Negative.   Eyes: Negative.   Respiratory: Negative.   Cardiovascular: Negative.   Gastrointestinal: Negative.   Endocrine: Negative.   Genitourinary: Negative.   Musculoskeletal: Negative.   Skin: Negative.   Allergic/Immunologic: Negative.   Neurological: Negative.   Hematological: Negative.   Psychiatric/Behavioral: Negative.      Today's  Vitals   11/13/18 0915  BP: 124/80  Pulse: 78  Temp: 98 F (36.7 C)  TempSrc: Oral  SpO2: 97%  Weight: 239 lb (108.4 kg)  Height: 5' 4.5" (1.638 m)  PainSc: 0-No pain   Body mass index is 40.39 kg/m.     Objective:  Physical Exam Constitutional:      Appearance: She is well-developed.  HENT:     Head: Normocephalic and atraumatic.     Right Ear: Hearing, tympanic membrane, ear canal and external ear normal.     Left Ear: Hearing, tympanic membrane, ear canal and external ear normal.     Nose: Nose normal.     Mouth/Throat:     Mouth: Mucous membranes are moist.  Eyes:     General: Lids are normal.     Conjunctiva/sclera: Conjunctivae normal.     Pupils: Pupils are equal, round, and reactive to light.     Funduscopic exam:    Right eye: No papilledema.        Left eye: No papilledema.  Neck:     Musculoskeletal: Full passive range of motion without pain, normal range of motion and neck supple.     Thyroid: No thyroid mass (multiple nodules present).     Vascular: No carotid bruit.  Cardiovascular:     Rate and Rhythm: Normal rate and regular rhythm.  Pulses: Normal pulses.     Heart sounds: Normal heart sounds. No murmur.  Pulmonary:     Effort: Pulmonary effort is normal.     Breath sounds: Normal breath sounds.  Abdominal:     General: Abdomen is flat. Bowel sounds are normal.     Palpations: Abdomen is soft.  Musculoskeletal: Normal range of motion.  Skin:    General: Skin is warm and dry.     Capillary Refill: Capillary refill takes less than 2 seconds.  Neurological:     General: No focal deficit present.     Mental Status: She is alert and oriented to person, place, and time.     Cranial Nerves: No cranial nerve deficit.     Sensory: No sensory deficit.  Psychiatric:        Behavior: Behavior normal.        Thought Content: Thought content normal.        Judgment: Judgment normal.          Assessment And Plan:     1. Encounter for Medicare  annual wellness exam . Behavior modifications discussed and diet history reviewed.   . Pt will continue to exercise regularly and modify diet with low GI, plant based foods and decrease intake of processed foods.  . Recommend intake of daily multivitamin, Vitamin D, and calcium.  . Recommend mammogram and colonoscopy for preventive screenings, as well as recommend immunizations that include influenza, TDAP, and Shingles - Hemoglobin A1c - Vitamin D (25 hydroxy) - MM Digital Screening; Future  2. Need for influenza vaccination  Influenza vaccine administered  Encouraged to take Tylenol as needed for fever or muscle aches. - Flu vaccine HIGH DOSE PF (Fluzone High dose)  3. Encounter for screening mammogram for breast cancer  Instructed on Self Breast Exam.According to ACOG guidelines Women aged 50 and older are recommended to get an annual mammogram.   Mammogram order placed and advised will receive a call from [facility] for appointment scheduling.   Encouraged to get annual mammogram  4. Encounter for screening - HIV antibody (with reflex) - Hepatitis C antibody  5. Hypertension, unspecified type . B/P is controlled.  . CMP ordered to check renal function. The importance of regular exercise and dietary modification was stressed to the patient. Stressed importance of losing ten percent of her body weight to help with B/P control. The weight loss would help with decreasing cardiac and cancer risk as well.   - EKG 12-Lead - Hepatic function panel - lisinopril-hydrochlorothiazide (PRINZIDE,ZESTORETIC) 20-25 MG tablet; Take 1 tablet by mouth daily.  Dispense: 90 tablet; Refill: 1 - Lipid panel - BMP8+eGFR  6. Multiple thyroid nodules  Nodules palpated to thyroid, will check thyroid ultrasound - US THYROID; Future  7. Vitamin D deficiency  Continue with vitamin d supplement       Minette Brine, FNP      Subjective:    Linda Mccoy is a 67 y.o. female who  presents for a Welcome to Medicare exam.   Review of Systems See above        Objective:    Today's Vitals   11/13/18 0915  BP: 124/80  Pulse: 78  Temp: 98 F (36.7 C)  TempSrc: Oral  SpO2: 97%  Weight: 239 lb (108.4 kg)  Height: 5' 4.5" (1.638 m)  PainSc: 0-No pain  Body mass index is 40.39 kg/m.  Medications Outpatient Encounter Medications as of 11/13/2018  Medication Sig  . aspirin EC 81  MG tablet Take 81 mg by mouth daily.  . Cholecalciferol (VITAMIN D) 2000 UNITS CAPS Take 2,000 Units by mouth daily.  Marland Kitchen lisinopril-hydrochlorothiazide (PRINZIDE,ZESTORETIC) 20-25 MG tablet Take 1 tablet by mouth daily.  . [DISCONTINUED] lisinopril-hydrochlorothiazide (PRINZIDE,ZESTORETIC) 20-25 MG per tablet Take 1 tablet by mouth daily.  . [DISCONTINUED] acetaminophen (TYLENOL) 650 MG CR tablet Take 650 mg by mouth daily as needed for pain.  . [DISCONTINUED] Hydrocodone-Acetaminophen (VICODIN) 5-300 MG TABS Take 1 tablet by mouth every 4 (four) hours as needed. (Patient not taking: Reported on 11/13/2018)  . [DISCONTINUED] ibuprofen (ADVIL,MOTRIN) 800 MG tablet Take 1 tablet (800 mg total) by mouth every 8 (eight) hours as needed. (Patient not taking: Reported on 11/13/2018)  . [DISCONTINUED] Lecithin 1200 MG CAPS Take 1,200 mg by mouth daily.  . [DISCONTINUED] OVER THE COUNTER MEDICATION Take 2 tablets by mouth daily. Patient takes Multi-Fiber Cleanse supplement by [employer] to support colon health and regularity.  . [DISCONTINUED] OVER THE COUNTER MEDICATION Take 1 tablet by mouth daily. Patient takes Multi-Herb Digestion and Detox Support supplement by [employer] to support digestion and detoxification.   No facility-administered encounter medications on file as of 11/13/2018.      History: Past Medical History:  Diagnosis Date  . Anemia    History  . Borderline diabetes    no meds  . Depression    no meds  . Fibroids   . Goiter   . History of blood transfusion     approx [facility] yrs ago [facility]  . Hypertension   . Sleep apnea    Does not use CPAP  . SVD (spontaneous vaginal delivery)    x 3   Past Surgical History:  Procedure Laterality Date  . COLONOSCOPY    . DILATATION & CURRETTAGE/HYSTEROSCOPY WITH RESECTOCOPE N/A 05/30/2014   Procedure: DILATATION & CURETTAGE/HYSTEROSCOPY WITH RESECTion of endometrial polyp;  Surgeon: Ena Dawley, MD;  Location: [facility];  Service: Gynecology;  Laterality: N/A;  . TUBAL LIGATION      Family History  Problem Relation Age of Onset  . Diabetes Mother   . Hypertension Mother   . Alcohol abuse Mother    Social History   Occupational History  . Not on file  Tobacco Use  . Smoking status: Never Smoker  . Smokeless tobacco: Never Used  Substance and Sexual Activity  . Alcohol use: Yes    Comment: socially   . Drug use: No  . Sexual activity: Not Currently    Partners: Male    Birth control/protection: Post-menopausal, Surgical    Comment: BTL     Tobacco Counseling Counseling given: Not Answered   Immunizations and Health Maintenance Immunization History  Administered Date(s) Administered  . Influenza, High Dose Seasonal PF 11/13/2018   Health Maintenance Due  Topic Date Due  . HIV Screening  10/18/1968  . TETANUS/TDAP  10/18/1972  . COLONOSCOPY  10/19/2003  . PNA vac Low Risk Adult (1 of 2 - PCV13) 10/18/2018    Activities of Daily Living In your present state of health, do you have any difficulty performing the following activities: 11/13/2018  Hearing? N  Vision? N  Difficulty concentrating or making decisions? Y  Walking or climbing stairs? Y  Dressing or bathing? N  Doing errands, shopping? N  Some recent data might be hidden    Physical Exam  (optional), or other factors deemed appropriate based on the beneficiary's medical and social history and current clinical standards.  Advanced Directives: Does Patient Have a  Medical Advance Directive?: Yes Does patient want to make  changes to medical advance directive?: Yes (MAU/Ambulatory/Procedural Areas - Information given)    Assessment:    This is a routine wellness examination for this patient .   Vision/Hearing screen No exam data present  Dietary issues and exercise activities discussed:     Goals    . Weight (lb) < 200 lb (90.7 kg)     She would like to lose weight.  She is walking, raking leaves and working in the yard.        Depression Screen PHQ 2/9 Scores 11/13/2018  PHQ - 2 Score 0     Fall Risk Fall Risk  11/13/2018  Falls in the past year? 0    Cognitive Function:        Patient Care Team: Minette Brine, FNP as PCP - General (General Practice)     Plan:   See above   I have personally reviewed and noted the following in the patient's chart:   . Medical and social history . Use of alcohol, tobacco or illicit drugs  . Current medications and supplements . Functional ability and status . Nutritional status . Physical activity . Advanced directives . List of other physicians . Hospitalizations, surgeries, and ER visits in previous 12 months . Vitals . Screenings to include cognitive, depression, and falls . Referrals and appointments  In addition, I have reviewed and discussed with patient certain preventive protocols, quality metrics, and best practice recommendations. A written personalized care plan for preventive services as well as general preventive health recommendations were provided to patient.     Minette Brine, FNP 11/13/2018      Subjective:    Linda Mccoy is a 67 y.o. female who presents for a Welcome to Medicare exam.   Review of Systems See above        Objective:    Today's Vitals   11/13/18 0915  BP: 124/80  Pulse: 78  Temp: 98 F (36.7 C)  TempSrc: Oral  SpO2: 97%  Weight: 239 lb (108.4 kg)  Height: 5' 4.5" (1.638 m)  PainSc: 0-No pain  Body mass index is 40.39 kg/m.  Medications Outpatient Encounter Medications as of  11/13/2018  Medication Sig  . aspirin EC 81 MG tablet Take 81 mg by mouth daily.  . Cholecalciferol (VITAMIN D) 2000 UNITS CAPS Take 2,000 Units by mouth daily.  Marland Kitchen lisinopril-hydrochlorothiazide (PRINZIDE,ZESTORETIC) 20-25 MG tablet Take 1 tablet by mouth daily.  . [DISCONTINUED] lisinopril-hydrochlorothiazide (PRINZIDE,ZESTORETIC) 20-25 MG per tablet Take 1 tablet by mouth daily.  . [DISCONTINUED] acetaminophen (TYLENOL) 650 MG CR tablet Take 650 mg by mouth daily as needed for pain.  . [DISCONTINUED] Hydrocodone-Acetaminophen (VICODIN) 5-300 MG TABS Take 1 tablet by mouth every 4 (four) hours as needed. (Patient not taking: Reported on 11/13/2018)  . [DISCONTINUED] ibuprofen (ADVIL,MOTRIN) 800 MG tablet Take 1 tablet (800 mg total) by mouth every 8 (eight) hours as needed. (Patient not taking: Reported on 11/13/2018)  . [DISCONTINUED] Lecithin 1200 MG CAPS Take 1,200 mg by mouth daily.  . [DISCONTINUED] OVER THE COUNTER MEDICATION Take 2 tablets by mouth daily. Patient takes Multi-Fiber Cleanse supplement by [employer] to support colon health and regularity.  . [DISCONTINUED] OVER THE COUNTER MEDICATION Take 1 tablet by mouth daily. Patient takes Multi-Herb Digestion and Detox Support supplement by [employer] to support digestion and detoxification.   No facility-administered encounter medications on file as of 11/13/2018.      History: Past Medical  History:  Diagnosis Date  . Anemia    History  . Borderline diabetes    no meds  . Depression    no meds  . Fibroids   . Goiter   . History of blood transfusion    approx 20 yrs ago at [facility]  . Hypertension   . Sleep apnea    Does not use CPAP  . SVD (spontaneous vaginal delivery)    x 3   Past Surgical History:  Procedure Laterality Date  . COLONOSCOPY    . DILATATION & CURRETTAGE/HYSTEROSCOPY WITH RESECTOCOPE N/A 05/30/2014   Procedure: DILATATION & CURETTAGE/HYSTEROSCOPY WITH RESECTion of endometrial polyp;  Surgeon: Ena Dawley, MD;  Location: [facility];  Service: Gynecology;  Laterality: N/A;  . TUBAL LIGATION      Family History  Problem Relation Age of Onset  . Diabetes Mother   . Hypertension Mother   . Alcohol abuse Mother    Social History   Occupational History  . Not on file  Tobacco Use  . Smoking status: Never Smoker  . Smokeless tobacco: Never Used  Substance and Sexual Activity  . Alcohol use: Yes    Comment: socially   . Drug use: No  . Sexual activity: Not Currently    Partners: Male    Birth control/protection: Post-menopausal, Surgical    Comment: BTL     Tobacco Counseling Counseling given: Not Answered   Immunizations and Health Maintenance Immunization History  Administered Date(s) Administered  . Influenza, High Dose Seasonal PF 11/13/2018   Health Maintenance Due  Topic Date Due  . TETANUS/TDAP  10/18/1972  . COLONOSCOPY  10/19/2003  . PNA vac Low Risk Adult (1 of 2 - PCV13) 10/18/2018    Activities of Daily Living In your present state of health, do you have any difficulty performing the following activities: 11/13/2018  Hearing? N  Vision? N  Difficulty concentrating or making decisions? Y  Walking or climbing stairs? Y  Dressing or bathing? N  Doing errands, shopping? N  Some recent data might be hidden    Physical Exam  See above (optional), or other factors deemed appropriate based on the beneficiary's medical and social history and current clinical standards.  Advanced Directives: Does Patient Have a Medical Advance Directive?: Yes Does patient want to make changes to medical advance directive?: Yes (MAU/Ambulatory/Procedural Areas - Information given)    Assessment:    This is a routine wellness examination for this patient .   Vision/Hearing screen  Hearing Screening   '125Hz'  '250Hz'  '500Hz'  '1000Hz'  '2000Hz'  '3000Hz'  '4000Hz'  '6000Hz'  '8000Hz'   Right ear:   0 40 40  40    Left ear:   '20 25 25  25      ' Dietary issues and exercise activities discussed:       Goals    . Weight (lb) < 200 lb (90.7 kg)     She would like to lose weight.  She is walking, raking leaves and working in the yard.        Depression Screen PHQ 2/9 Scores 11/13/2018  PHQ - 2 Score 0     Fall Risk Fall Risk  11/13/2018  Falls in the past year? 0    Cognitive Function:        Patient Care Team: Minette Brine, FNP as PCP - General (General Practice)     Plan:    See above  I have personally reviewed and noted the following in the patient's chart:   . Medical  and social history . Use of alcohol, tobacco or illicit drugs  . Current medications and supplements . Functional ability and status . Nutritional status . Physical activity . Advanced directives . List of other physicians . Hospitalizations, surgeries, and ER visits in previous 12 months . Vitals . Screenings to include cognitive, depression, and falls . Referrals and appointments  In addition, I have reviewed and discussed with patient certain preventive protocols, quality metrics, and best practice recommendations. A written personalized care plan for preventive services as well as general preventive health recommendations were provided to patient.     Minette Brine, FNP 11/29/2018

## 2018-11-13 NOTE — Patient Instructions (Addendum)
Medical Screening Exam A medical screening exam helps determine whether or not you need emergency medical treatment. During the medical screening exam, a health care provider does a short physical exam and medical history to assess:  Your current symptoms.  Your overall health.  Depending on your symptoms, you may need additional tests. What are the possible outcomes of a medical screening exam? Your medical screening exam may determine that:  You do not need emergency treatment at this time.  You need treatment right away.  You need to be transferred to another medical center.  When should I seek medical care? If you have a regular health care provider, make an appointment for a follow-up visit with him or her. If you do not have a regular health care provider, ask about resources in your community. Get help right away if: Your condition may change over time. If your condition gets worse or you develop new or troubling symptoms before you see your health care provider, go to an emergency department right away. In an emergency:  Call 911 or have someone drive you to the nearest hospital.  Do not drive yourself. This information is not intended to replace advice given to you by your health care provider. Make sure you discuss any questions you have with your health care provider. Document Released: 01/06/2005 Document Revised: 08/10/2016 Document Reviewed: 09/11/2015 Elsevier Interactive Patient Education  2018 [place]. [place] , Thank you for taking time to come for your Medicare Wellness Visit. I appreciate your ongoing commitment to your health goals. Please review the following plan we discussed and let me know if I can assist you in the future.   These are the goals we discussed: Goals    . Weight (lb) < 200 lb (90.7 kg)     She would like to lose weight.  She is walking, raking leaves and working in the yard.         This is a list of the screening  recommended for you and due dates:  Health Maintenance  Topic Date Due  . HIV Screening  10/18/1968  . Tetanus Vaccine  10/18/1972  . Colon Cancer Screening  10/19/2003  . Pneumonia vaccines (1 of 2 - PCV13) 10/18/2018  . Mammogram  02/03/2019  . Pap Smear  05/07/2019  . Flu Shot  Completed  . DEXA scan (bone density measurement)  Completed  .  Hepatitis C: One time screening is recommended by Center for Disease Control  (CDC) for  adults born from 18 through 1963.   Completed     Advance Directive Advance directives are legal documents that let you make choices ahead of time about your health care and medical treatment in case you become unable to communicate for yourself. Advance directives are a way for you to communicate your wishes to family, friends, and health care providers. This can help convey your decisions about end-of-life care if you become unable to communicate. Discussing and writing advance directives should happen over time rather than all at once. Advance directives can be changed depending on your situation and what you want, even after you have signed the advance directives. If you do not have an advance directive, some states assign family decision makers to act on your behalf based on how closely you are related to them. Each state has its own laws regarding advance directives. You may want to check with your health care provider, attorney, or state representative about the laws in your state.  There are different types of advance directives, such as:  Medical power of attorney.  Living will.  Do not resuscitate (DNR) or do not attempt resuscitation (DNAR) order.  Health care proxy and medical power of attorney A health care proxy, also called a health care agent, is a person who is appointed to make medical decisions for you in cases in which you are unable to make the decisions yourself. Generally, people choose someone they know well and trust to represent their  preferences. Make sure to ask this person for an agreement to act as your proxy. A proxy may have to exercise judgment in the event of a medical decision for which your wishes are not known. A medical power of attorney is a legal document that names your health care proxy. Depending on the laws in your state, after the document is written, it may also need to be:  Signed.  Notarized.  Dated.  Copied.  Witnessed.  Incorporated into your medical record.  You may also want to appoint someone to manage your financial affairs in a situation in which you are unable to do so. This is called a durable power of attorney for finances. It is a separate legal document from the durable power of attorney for health care. You may choose the same person or someone different from your health care proxy to act as your agent in financial matters. If you do not appoint a proxy, or if there is a concern that the proxy is not acting in your best interests, a court-appointed guardian may be designated to act on your behalf. Living will A living will is a set of instructions documenting your wishes about medical care when you cannot express them yourself. Health care providers should keep a copy of your living will in your medical record. You may want to give a copy to family members or friends. To alert caregivers in case of an emergency, you can place a card in your wallet to let them know that you have a living will and where they can find it. A living will is used if you become:  Terminally ill.  Incapacitated.  Unable to communicate or make decisions.  Items to consider in your living will include:  The use or non-use of life-sustaining equipment, such as dialysis machines and breathing machines (ventilators).  A DNR or DNAR order, which is the instruction not to use cardiopulmonary resuscitation (CPR) if breathing or heartbeat stops.  The use or non-use of tube feeding.  Withholding of food and  fluids.  Comfort (palliative) care when the goal becomes comfort rather than a cure.  Organ and tissue donation.  A living will does not give instructions for distributing your money and property if you should pass away. It is recommended that you seek the advice of a lawyer when writing a will. Decisions about taxes, beneficiaries, and asset distribution will be legally binding. This process can relieve your family and friends of any concerns surrounding disputes or questions that may come up about the distribution of your assets. DNR or DNAR A DNR or DNAR order is a request not to have CPR in the event that your heart stops beating or you stop breathing. If a DNR or DNAR order has not been made and shared, a health care provider will try to help any patient whose heart has stopped or who has stopped breathing. If you plan to have surgery, talk with your health care provider about how your DNR or  DNAR order will be followed if problems occur. Summary  Advance directives are the legal documents that allow you to make choices ahead of time about your health care and medical treatment in case you become unable to communicate for yourself.  The process of discussing and writing advance directives should happen over time. You can change the advance directives, even after you have signed them.  Advance directives include DNR or DNAR orders, living wills, and designating an agent as your medical power of attorney. This information is not intended to replace advice given to you by your health care provider. Make sure you discuss any questions you have with your health care provider. Document Released: 03/07/2008 Document Revised: 10/18/2016 Document Reviewed: 10/18/2016 Elsevier Interactive Patient Education  2017 [employer].

## 2018-11-14 ENCOUNTER — Ambulatory Visit
Admission: RE | Admit: 2018-11-14 | Discharge: 2018-11-14 | Disposition: A | Discharge status: Home / Self Care | Payer: Medicare HMO | Source: Ambulatory Visit | Attending: Nurse Practitioner | Admitting: Nurse Practitioner

## 2018-11-14 DIAGNOSIS — E042 Nontoxic multinodular goiter: Secondary | ICD-10-CM

## 2018-11-14 LAB — HEPATIC FUNCTION PANEL
ALBUMIN: 4.1 g/dL (ref 3.6–4.8)
ALT: 16 IU/L (ref 0–32)
AST: 21 IU/L (ref 0–40)
Alkaline Phosphatase: 88 IU/L (ref 39–117)
Bilirubin Total: 0.3 mg/dL (ref 0.0–1.2)
Bilirubin, Direct: 0.09 mg/dL (ref 0.00–0.40)
Total Protein: 7.3 g/dL (ref 6.0–8.5)

## 2018-11-14 LAB — BMP8+EGFR
BUN/Creatinine Ratio: 17 (ref 12–28)
BUN: 11 mg/dL (ref 8–27)
CO2: 27 mmol/L (ref 20–29)
Calcium: 9.7 mg/dL (ref 8.7–10.3)
Chloride: 94 mmol/L — ABNORMAL LOW (ref 96–106)
Creatinine, Ser: 0.66 mg/dL (ref 0.57–1.00)
GFR calc Af Amer: 107 mL/min/{1.73_m2} (ref >59)
GFR calc non Af Amer: 93 mL/min/{1.73_m2} (ref >59)
GLUCOSE: 84 mg/dL (ref 65–99)
Potassium: 3.9 mmol/L (ref 3.5–5.2)
SODIUM: 135 mmol/L (ref 134–144)

## 2018-11-14 LAB — LIPID PANEL
CHOL/HDL RATIO: 2.2 ratio (ref 0.0–4.4)
Cholesterol, Total: 173 mg/dL (ref 100–199)
HDL: 80 mg/dL (ref >39)
LDL Calculated: 80 mg/dL (ref 0–99)
TRIGLYCERIDES: 67 mg/dL (ref 0–149)
VLDL Cholesterol Cal: 13 mg/dL (ref 5–40)

## 2018-11-14 LAB — VITAMIN D 25 HYDROXY (VIT D DEFICIENCY, FRACTURES): Vit D, 25-Hydroxy: 33 ng/mL (ref 30.0–100.0)

## 2018-11-14 LAB — HEMOGLOBIN A1C
Est. average glucose Bld gHb Est-mCnc: 123 mg/dL
Hgb A1c MFr Bld: 5.9 % — ABNORMAL HIGH (ref 4.8–5.6)

## 2018-11-14 LAB — HEPATITIS C ANTIBODY: Hep C Virus Ab: <0.1 s/co ratio (ref 0.0–0.9)

## 2018-11-14 LAB — HIV ANTIBODY (ROUTINE TESTING W REFLEX): HIV Screen 4th Generation wRfx: NONREACTIVE

## 2018-11-15 ENCOUNTER — Other Ambulatory Visit: Payer: Self-pay | Admitting: Nurse Practitioner

## 2018-11-15 DIAGNOSIS — E042 Nontoxic multinodular goiter: Secondary | ICD-10-CM

## 2018-11-15 LAB — CYTOLOGY, (ORAL, ANAL, URETHRAL) ANCILLARY ONLY
Candida vaginitis: NEGATIVE
Chlamydia: NEGATIVE
Neisseria Gonorrhea: NEGATIVE
Trichomonas: NEGATIVE

## 2018-11-29 DIAGNOSIS — E559 Vitamin D deficiency, unspecified: Secondary | ICD-10-CM | POA: Insufficient documentation

## 2018-11-29 HISTORY — DX: Vitamin D deficiency, unspecified: E55.9

## 2018-12-26 ENCOUNTER — Ambulatory Visit
Admission: RE | Admit: 2018-12-26 | Discharge: 2018-12-26 | Disposition: A | Discharge status: Home / Self Care | Payer: Medicare HMO | Source: Ambulatory Visit | Attending: Nurse Practitioner | Admitting: Nurse Practitioner

## 2018-12-26 DIAGNOSIS — Z0000 Encounter for general adult medical examination without abnormal findings: Secondary | ICD-10-CM

## 2018-12-27 ENCOUNTER — Other Ambulatory Visit: Payer: Self-pay | Admitting: Nurse Practitioner

## 2018-12-27 DIAGNOSIS — R928 Other abnormal and inconclusive findings on diagnostic imaging of breast: Secondary | ICD-10-CM

## 2019-01-03 ENCOUNTER — Ambulatory Visit
Admission: RE | Admit: 2019-01-03 | Discharge: 2019-01-03 | Disposition: A | Discharge status: Home / Self Care | Payer: Medicare HMO | Source: Ambulatory Visit | Attending: Nurse Practitioner | Admitting: Nurse Practitioner

## 2019-01-03 ENCOUNTER — Other Ambulatory Visit: Payer: Self-pay | Admitting: Nurse Practitioner

## 2019-01-03 DIAGNOSIS — N6310 Unspecified lump in the right breast, unspecified quadrant: Secondary | ICD-10-CM

## 2019-01-03 DIAGNOSIS — R928 Other abnormal and inconclusive findings on diagnostic imaging of breast: Secondary | ICD-10-CM

## 2019-01-08 ENCOUNTER — Ambulatory Visit
Admission: RE | Admit: 2019-01-08 | Discharge: 2019-01-08 | Disposition: A | Discharge status: Home / Self Care | Payer: Medicare HMO | Source: Ambulatory Visit | Attending: Nurse Practitioner | Admitting: Nurse Practitioner

## 2019-01-08 DIAGNOSIS — N6310 Unspecified lump in the right breast, unspecified quadrant: Secondary | ICD-10-CM

## 2019-01-08 HISTORY — PX: BREAST BIOPSY: SHX20

## 2019-02-12 ENCOUNTER — Ambulatory Visit (INDEPENDENT_AMBULATORY_CARE_PROVIDER_SITE_OTHER): Payer: Medicare HMO | Admitting: Nurse Practitioner

## 2019-02-12 ENCOUNTER — Encounter: Payer: Self-pay | Admitting: Nurse Practitioner

## 2019-02-12 VITALS — BP 110/70 | HR 69 | Ht 64.5 in | Wt 240.6 lb

## 2019-02-12 DIAGNOSIS — R7303 Prediabetes: Secondary | ICD-10-CM

## 2019-02-12 DIAGNOSIS — E042 Nontoxic multinodular goiter: Secondary | ICD-10-CM

## 2019-02-12 DIAGNOSIS — I10 Essential (primary) hypertension: Secondary | ICD-10-CM | POA: Diagnosis not present

## 2019-02-12 HISTORY — DX: Prediabetes: R73.03

## 2019-02-12 NOTE — Progress Notes (Addendum)
Subjective:     Patient ID: Linda Mccoy , female    DOB: 1951/10/27 , 68 y.o.   MRN: [id number]   Chief Complaint  Patient presents with  . Hypertension    HPI  Hypertension  This is a chronic problem. The current episode started more than 1 year ago. The problem has been gradually improving since onset. The problem is controlled. Pertinent negatives include no anxiety, chest pain, headaches or palpitations. There are no associated agents to hypertension. Risk factors for coronary artery disease include obesity. The current treatment provides no improvement. There is no history of angina or kidney disease. There is no history of chronic renal disease.     Past Medical History:  Diagnosis Date  . Anemia    History  . Borderline diabetes    no meds  . Depression    no meds  . Fibroids   . Goiter   . History of blood transfusion    approx 20 yrs ago at [facility]  . Hypertension   . Sleep apnea    Does not use CPAP  . SVD (spontaneous vaginal delivery)    x 3     Family History  Problem Relation Age of Onset  . Diabetes Mother   . Hypertension Mother   . Alcohol abuse Mother   . Breast cancer Neg Hx      Current Outpatient Medications:  .  aspirin EC 81 MG tablet, Take 81 mg by mouth daily., Disp: , Rfl:  .  Cholecalciferol (VITAMIN D) 2000 UNITS CAPS, Take 2,000 Units by mouth daily., Disp: , Rfl:  .  lisinopril-hydrochlorothiazide (PRINZIDE,ZESTORETIC) 20-25 MG tablet, Take 1 tablet by mouth daily., Disp: 90 tablet, Rfl: 1   Allergies  Allergen Reactions  . Penicillins Hives     Review of Systems  Constitutional: Negative for fatigue.  Cardiovascular: Negative for chest pain, palpitations and leg swelling.  Endocrine: Negative for polydipsia, polyphagia and polyuria.  Neurological: Negative for dizziness and headaches.     Today's Vitals   02/12/19 0913  BP: 110/70  Pulse: 69  SpO2: 96%  Weight: 240 lb 9.6 oz (109.1 kg)  Height: 5' 4.5" (1.638 m)    Body mass index is 40.66 kg/m.   Objective:  Physical Exam Vitals signs reviewed.  Constitutional:      General: She is not in acute distress.    Appearance: Normal appearance. She is well-developed. She is obese.  Eyes:     Pupils: Pupils are equal, round, and reactive to light.  Cardiovascular:     Rate and Rhythm: Normal rate and regular rhythm.     Pulses: Normal pulses.     Heart sounds: Normal heart sounds. No murmur.  Pulmonary:     Effort: Pulmonary effort is normal.     Breath sounds: Normal breath sounds.  Musculoskeletal: Normal range of motion.  Skin:    General: Skin is warm and dry.     Capillary Refill: Capillary refill takes less than 2 seconds.  Neurological:     General: No focal deficit present.     Mental Status: She is alert and oriented to person, place, and time.     Cranial Nerves: No cranial nerve deficit.  Psychiatric:        Mood and Affect: Mood normal.         Assessment And Plan:     1. Hypertension, unspecified type . B/P is controlled.  . The importance of regular exercise and dietary modification  was stressed to the patient.  . Stressed importance of losing ten percent of her body weight to help with B/P control.  . The weight loss would help with decreasing cardiac and cancer risk as well.   2. Multiple thyroid nodules  Will check on her referral to ENT  Will check thyroid levels today as well. - TSH - T3 - T4, Free  3. Prediabetes  Chronic, controlled  Continue with current medications  Encouraged to limit intake of sugary foods and drinks  Encouraged to increase physical activity to 150 minutes per week    Minette Brine, FNP

## 2019-02-13 LAB — T3: T3, Total: 143 ng/dL (ref 71–180)

## 2019-02-13 LAB — TSH: TSH: 0.198 u[IU]/mL — ABNORMAL LOW (ref 0.450–4.500)

## 2019-02-13 LAB — T4, FREE: Free T4: 1.13 ng/dL (ref 0.82–1.77)

## 2019-02-15 ENCOUNTER — Other Ambulatory Visit: Payer: Self-pay | Admitting: Nurse Practitioner

## 2019-02-15 DIAGNOSIS — R7989 Other specified abnormal findings of blood chemistry: Secondary | ICD-10-CM

## 2019-02-15 DIAGNOSIS — E042 Nontoxic multinodular goiter: Secondary | ICD-10-CM

## 2019-02-15 NOTE — Progress Notes (Signed)
Order placed for ENT. 

## 2019-03-16 ENCOUNTER — Telehealth: Payer: Self-pay | Admitting: Nurse Practitioner

## 2019-03-26 ENCOUNTER — Other Ambulatory Visit: Payer: Self-pay | Admitting: Nurse Practitioner

## 2019-03-26 DIAGNOSIS — I10 Essential (primary) hypertension: Secondary | ICD-10-CM

## 2019-03-26 MED ORDER — LISINOPRIL-HYDROCHLOROTHIAZIDE 20-25 MG PO TABS: 1.0000 | ORAL_TABLET | Freq: Every day | ORAL | 0 refills | Status: DC

## 2019-03-29 ENCOUNTER — Other Ambulatory Visit: Payer: Self-pay

## 2019-03-29 DIAGNOSIS — I10 Essential (primary) hypertension: Secondary | ICD-10-CM

## 2019-03-29 MED ORDER — LISINOPRIL-HYDROCHLOROTHIAZIDE 20-25 MG PO TABS: 1.0000 | ORAL_TABLET | Freq: Every day | ORAL | 0 refills | Status: DC

## 2019-05-15 ENCOUNTER — Ambulatory Visit: Payer: Medicare HMO | Admitting: Internal Medicine

## 2019-05-29 ENCOUNTER — Other Ambulatory Visit: Payer: Self-pay | Admitting: Nurse Practitioner

## 2019-05-29 DIAGNOSIS — N6489 Other specified disorders of breast: Secondary | ICD-10-CM

## 2019-06-07 ENCOUNTER — Ambulatory Visit
Admission: RE | Admit: 2019-06-07 | Discharge: 2019-06-07 | Disposition: A | Discharge status: Home / Self Care | Payer: Medicare HMO | Source: Ambulatory Visit | Attending: Nurse Practitioner | Admitting: Nurse Practitioner

## 2019-06-07 DIAGNOSIS — N6489 Other specified disorders of breast: Secondary | ICD-10-CM

## 2019-08-31 LAB — HM COLONOSCOPY

## 2019-09-03 ENCOUNTER — Encounter: Payer: Self-pay | Admitting: Nurse Practitioner

## 2019-09-07 ENCOUNTER — Telehealth: Payer: Self-pay

## 2019-09-07 ENCOUNTER — Other Ambulatory Visit: Payer: Self-pay | Admitting: Nurse Practitioner

## 2019-09-07 DIAGNOSIS — I10 Essential (primary) hypertension: Secondary | ICD-10-CM

## 2019-09-07 NOTE — Telephone Encounter (Signed)
Need to know if pt is taking crestor. Left vm

## 2019-10-03 ENCOUNTER — Encounter: Payer: Self-pay | Admitting: Nurse Practitioner

## 2019-10-05 ENCOUNTER — Telehealth: Payer: Self-pay | Admitting: Nurse Practitioner

## 2019-10-05 NOTE — Telephone Encounter (Signed)
I left a message asking the patient to call me to schedule earlier AWV in Oct or Nov before she sees [place] in Dec.

## 2019-10-08 ENCOUNTER — Telehealth: Payer: Self-pay

## 2019-10-08 NOTE — Telephone Encounter (Signed)
Patient called stating she received a call about rescheduling her AWV from dec to sometime in oct or nov I have returned pt call and rescheduled it. YRL,RMA

## 2019-10-09 ENCOUNTER — Encounter: Payer: Self-pay | Admitting: Nurse Practitioner

## 2019-10-30 ENCOUNTER — Other Ambulatory Visit: Payer: Self-pay

## 2019-10-30 ENCOUNTER — Ambulatory Visit: Payer: Self-pay

## 2019-10-30 ENCOUNTER — Other Ambulatory Visit: Payer: Self-pay | Admitting: Nurse Practitioner

## 2019-10-30 ENCOUNTER — Ambulatory Visit (INDEPENDENT_AMBULATORY_CARE_PROVIDER_SITE_OTHER): Payer: Medicare HMO

## 2019-10-30 VITALS — BP 124/70 | HR 62 | Temp 98.1°F | Ht 65.2 in | Wt 243.8 lb

## 2019-10-30 DIAGNOSIS — I10 Essential (primary) hypertension: Secondary | ICD-10-CM

## 2019-10-30 DIAGNOSIS — R82998 Other abnormal findings in urine: Secondary | ICD-10-CM

## 2019-10-30 DIAGNOSIS — Z0000 Encounter for general adult medical examination without abnormal findings: Secondary | ICD-10-CM | POA: Diagnosis not present

## 2019-10-30 LAB — POCT URINALYSIS DIPSTICK
Glucose, UA: NEGATIVE
Ketones, UA: NEGATIVE
Nitrite, UA: NEGATIVE
Protein, UA: NEGATIVE
Spec Grav, UA: 1.025 (ref 1.010–1.025)
Urobilinogen, UA: 0.2 E.U./dL
pH, UA: 5.5 (ref 5.0–8.0)

## 2019-10-30 LAB — POCT UA - MICROALBUMIN
Albumin/Creatinine Ratio, Urine, POC: <30
Creatinine, POC: 300 mg/dL
Microalbumin Ur, POC: 80 mg/L

## 2019-10-30 NOTE — Patient Instructions (Signed)
[name] , Thank you for taking time to come for your Medicare Wellness Visit. I appreciate your ongoing commitment to your health goals. Please review the following plan we discussed and let me know if I can assist you in the future.   Screening recommendations/referrals: Colonoscopy: 08/2019 Mammogram: 12/2018 Bone Density: 11/2017 Recommended yearly ophthalmology/optometry visit for glaucoma screening and checkup Recommended yearly dental visit for hygiene and checkup  Vaccinations: Influenza vaccine: 09/2019 Pneumococcal vaccine: declined at this time Tdap vaccine: 08/2010 Shingles vaccine: discussed    Advanced directives: Advance directive discussed with you today. I have provided a copy for you to complete at home and have notarized. Once this is complete please bring a copy in to our office so we can scan it into your chart.   Conditions/risks identified: obesity  Next appointment: 11/28/2019 at 10:00   Preventive Care 40 Years and Older, Female Preventive care refers to lifestyle choices and visits with your health care provider that can promote health and wellness. What does preventive care include?  A yearly physical exam. This is also called an annual well check.  Dental exams once or twice a year.  Routine eye exams. Ask your health care provider how often you should have your eyes checked.  Personal lifestyle choices, including:  Daily care of your teeth and gums.  Regular physical activity.  Eating a healthy diet.  Avoiding tobacco and drug use.  Limiting alcohol use.  Practicing safe sex.  Taking low-dose aspirin every day.  Taking vitamin and mineral supplements as recommended by your health care provider. What happens during an annual well check? The services and screenings done by your health care provider during your annual well check will depend on your age, overall health, lifestyle risk factors, and family history of disease. Counseling   Your health care provider may ask you questions about your:  Alcohol use.  Tobacco use.  Drug use.  Emotional well-being.  Home and relationship well-being.  Sexual activity.  Eating habits.  History of falls.  Memory and ability to understand (cognition).  Work and work Statistician.  Reproductive health. Screening  You may have the following tests or measurements:  Height, weight, and BMI.  Blood pressure.  Lipid and cholesterol levels. These may be checked every 5 years, or more frequently if you are over 73 years old.  Skin check.  Lung cancer screening. You may have this screening every year starting at age 51 if you have a 30-pack-year history of smoking and currently smoke or have quit within the past 15 years.  Fecal occult blood test (FOBT) of the stool. You may have this test every year starting at age 60.  Flexible sigmoidoscopy or colonoscopy. You may have a sigmoidoscopy every 5 years or a colonoscopy every 10 years starting at age 41.  Hepatitis C blood test.  Hepatitis B blood test.  Sexually transmitted disease (STD) testing.  Diabetes screening. This is done by checking your blood sugar (glucose) after you have not eaten for a while (fasting). You may have this done every 1-3 years.  Bone density scan. This is done to screen for osteoporosis. You may have this done starting at age 51.  Mammogram. This may be done every 1-2 years. Talk to your health care provider about how often you should have regular mammograms. Talk with your health care provider about your test results, treatment options, and if necessary, the need for more tests. Vaccines  Your health care provider may recommend certain vaccines,  such as:  Influenza vaccine. This is recommended every year.  Tetanus, diphtheria, and acellular pertussis (Tdap, Td) vaccine. You may need a Td booster every 10 years.  Zoster vaccine. You may need this after age 37.  Pneumococcal 13-valent  conjugate (PCV13) vaccine. One dose is recommended after age 15.  Pneumococcal polysaccharide (PPSV23) vaccine. One dose is recommended after age 62. Talk to your health care provider about which screenings and vaccines you need and how often you need them. This information is not intended to replace advice given to you by your health care provider. Make sure you discuss any questions you have with your health care provider. Document Released: 12/26/2015 Document Revised: 08/18/2016 Document Reviewed: 09/30/2015 Elsevier Interactive Patient Education  2017 [place] Prevention in the Home Falls can cause injuries. They can happen to people of all ages. There are many things you can do to make your home safe and to help prevent falls. What can I do on the outside of my home?  Regularly fix the edges of walkways and driveways and fix any cracks.  Remove anything that might make you trip as you walk through a door, such as a raised step or threshold.  Trim any bushes or trees on the path to your home.  Use bright outdoor lighting.  Clear any walking paths of anything that might make someone trip, such as rocks or tools.  Regularly check to see if handrails are loose or broken. Make sure that both sides of any steps have handrails.  Any raised decks and porches should have guardrails on the edges.  Have any leaves, snow, or ice cleared regularly.  Use sand or salt on walking paths during winter.  Clean up any spills in your garage right away. This includes oil or grease spills. What can I do in the bathroom?  Use night lights.  Install grab bars by the toilet and in the tub and shower. Do not use towel bars as grab bars.  Use non-skid mats or decals in the tub or shower.  If you need to sit down in the shower, use a plastic, non-slip stool.  Keep the floor dry. Clean up any water that spills on the floor as soon as it happens.  Remove soap buildup in the tub or  shower regularly.  Attach bath mats securely with double-sided non-slip rug tape.  Do not have throw rugs and other things on the floor that can make you trip. What can I do in the bedroom?  Use night lights.  Make sure that you have a light by your bed that is easy to reach.  Do not use any sheets or blankets that are too big for your bed. They should not hang down onto the floor.  Have a firm chair that has side arms. You can use this for support while you get dressed.  Do not have throw rugs and other things on the floor that can make you trip. What can I do in the kitchen?  Clean up any spills right away.  Avoid walking on wet floors.  Keep items that you use a lot in easy-to-reach places.  If you need to reach something above you, use a strong step stool that has a grab bar.  Keep electrical cords out of the way.  Do not use floor polish or wax that makes floors slippery. If you must use wax, use non-skid floor wax.  Do not have throw rugs and other things on  the floor that can make you trip. What can I do with my stairs?  Do not leave any items on the stairs.  Make sure that there are handrails on both sides of the stairs and use them. Fix handrails that are broken or loose. Make sure that handrails are as long as the stairways.  Check any carpeting to make sure that it is firmly attached to the stairs. Fix any carpet that is loose or worn.  Avoid having throw rugs at the top or bottom of the stairs. If you do have throw rugs, attach them to the floor with carpet tape.  Make sure that you have a light switch at the top of the stairs and the bottom of the stairs. If you do not have them, ask someone to add them for you. What else can I do to help prevent falls?  Wear shoes that:  Do not have high heels.  Have rubber bottoms.  Are comfortable and fit you well.  Are closed at the toe. Do not wear sandals.  If you use a stepladder:  Make sure that it is fully  opened. Do not climb a closed stepladder.  Make sure that both sides of the stepladder are locked into place.  Ask someone to hold it for you, if possible.  Clearly mark and make sure that you can see:  Any grab bars or handrails.  First and last steps.  Where the edge of each step is.  Use tools that help you move around (mobility aids) if they are needed. These include:  Canes.  Walkers.  Scooters.  Crutches.  Turn on the lights when you go into a dark area. Replace any light bulbs as soon as they burn out.  Set up your furniture so you have a clear path. Avoid moving your furniture around.  If any of your floors are uneven, fix them.  If there are any pets around you, be aware of where they are.  Review your medicines with your doctor. Some medicines can make you feel dizzy. This can increase your chance of falling. Ask your doctor what other things that you can do to help prevent falls. This information is not intended to replace advice given to you by your health care provider. Make sure you discuss any questions you have with your health care provider. Document Released: 09/25/2009 Document Revised: 05/06/2016 Document Reviewed: 01/03/2015 Elsevier Interactive Patient Education  2017 [employer].

## 2019-10-30 NOTE — Progress Notes (Signed)
Subjective:   Linda Mccoy is a 68 y.o. female who presents for an Initial Medicare Annual Wellness Visit.  Review of Systems    n/a  Cardiac Risk Factors include: advanced age (>38men, >47 women);hypertension;obesity (BMI >30kg/m2)     Objective:    Today's Vitals   10/30/19 1009  BP: 124/70  Pulse: 62  Temp: 98.1 F (36.7 C)  TempSrc: Oral  SpO2: 95%  Weight: 243 lb 12.8 oz (110.6 kg)  Height: 5' 5.2" (1.656 m)   Body mass index is 40.32 kg/m.  Advanced Directives 10/30/2019 11/13/2018 05/23/2014  Does Patient Have a Medical Advance Directive? No Yes Patient does not have advance directive;Patient would not like information  Does patient want to make changes to medical advance directive? - Yes (MAU/Ambulatory/Procedural Areas - Information given) -  Would patient like information on creating a medical advance directive? Yes (MAU/Ambulatory/Procedural Areas - Information given) - -  Pre-existing out of facility DNR order (yellow form or pink MOST form) - - No    Current Medications (verified) Outpatient Encounter Medications as of 10/30/2019  Medication Sig  . aspirin EC 81 MG tablet Take 81 mg by mouth daily.  . Calcium-Magnesium-Zinc 333-133-5 MG TABS Take 1 tablet by mouth daily.  . Cholecalciferol (VITAMIN D) 2000 UNITS CAPS Take 2,000 Units by mouth daily.  Marland Kitchen lisinopril-hydrochlorothiazide (ZESTORETIC) 20-25 MG tablet TAKE 1 TABLET EVERY DAY   No facility-administered encounter medications on file as of 10/30/2019.     Allergies (verified) Penicillins   History: Past Medical History:  Diagnosis Date  . Anemia    History  . Borderline diabetes    no meds  . Depression    no meds  . Fibroids   . Goiter   . History of blood transfusion    approx 20 yrs ago at [facility]  . Hypertension   . Sleep apnea    Does not use CPAP  . SVD (spontaneous vaginal delivery)    x 3   Past Surgical History:  Procedure Laterality Date  . COLONOSCOPY    .  DILATATION & CURRETTAGE/HYSTEROSCOPY WITH RESECTOCOPE N/A 05/30/2014   Procedure: DILATATION & CURETTAGE/HYSTEROSCOPY WITH RESECTion of endometrial polyp;  Surgeon: Ena Dawley, MD;  Location: [facility];  Service: Gynecology;  Laterality: N/A;  . TUBAL LIGATION     Family History  Problem Relation Age of Onset  . Diabetes Mother   . Hypertension Mother   . Alcohol abuse Mother   . Breast cancer Neg Hx    Social History   Socioeconomic History  . Marital status: Widowed    Spouse name: Not on file  . Number of children: Not on file  . Years of education: Not on file  . Highest education level: Not on file  Occupational History  . Occupation: retired  Scientific laboratory technician  . Financial resource strain: Hard  . Food insecurity    Worry: Sometimes true    Inability: Sometimes true  . Transportation needs    Medical: Yes    Non-medical: Yes  Tobacco Use  . Smoking status: Never Smoker  . Smokeless tobacco: Never Used  Substance and Sexual Activity  . Alcohol use: Not Currently  . Drug use: No  . Sexual activity: Not Currently    Partners: Male    Birth control/protection: Post-menopausal, Surgical    Comment: BTL   Lifestyle  . Physical activity    Days per week: 3 days    Minutes per session: 40 min  .  Stress: Not at all  Relationships  . Social Herbalist on phone: Not on file    Gets together: Not on file    Attends religious service: Not on file    Active member of club or organization: Not on file    Attends meetings of clubs or organizations: Not on file    Relationship status: Not on file  Other Topics Concern  . Not on file  Social History Narrative  . Not on file    Tobacco Counseling Counseling given: Not Answered   Clinical Intake:  Pre-visit preparation completed: Yes  Pain : No/denies pain     Nutritional Status: BMI > 30  Obese Nutritional Risks: None Diabetes: No  How often do you need to have someone help you when you read  instructions, pamphlets, or other written materials from your doctor or pharmacy?: 1 - Never What is the last grade level you completed in school?: 12th grade  Interpreter Needed?: No  Information entered by :: NAllen LPN   Activities of Daily Living In your present state of health, do you have any difficulty performing the following activities: 10/30/2019 11/13/2018  Hearing? Y N  Comment due to low tone -  Vision? N N  Difficulty concentrating or making decisions? N Y  Walking or climbing stairs? N Y  Dressing or bathing? N N  Doing errands, shopping? N N  Preparing Food and eating ? N -  Using the Toilet? N -  In the past six months, have you accidently leaked urine? Y -  Comment when holds urine too long -  Do you have problems with loss of bowel control? N -  Managing your Medications? N -  Managing your Finances? N -  Housekeeping or managing your Housekeeping? N -  Some recent data might be hidden     Immunizations and Health Maintenance Immunization History  Administered Date(s) Administered  . Influenza, High Dose Seasonal PF 11/13/2018   There are no preventive care reminders to display for this patient.  Patient Care Team: Minette Brine, FNP as PCP - General (General Practice)  Indicate any recent Medical Services you may have received from other than [facility] providers in the past year (date may be approximate).     Assessment:   This is a routine wellness examination for Linda Mccoy.  Hearing/Vision screen  Hearing Screening   125Hz  250Hz  500Hz  1000Hz  2000Hz  3000Hz  4000Hz  6000Hz  8000Hz   Right ear:           Left ear:           Vision Screening Comments: Eye exams at least every 2 years  Dietary issues and exercise activities discussed: Current Exercise Habits: Home exercise routine, Type of exercise: walking;strength training/weights, Time (Minutes): 45, Frequency (Times/Week): 3, Weekly Exercise (Minutes/Week): 135  Goals    . DIET - INCREASE WATER INTAKE      10/30/2019, will try to write down to remind to drink water    . Weight (lb) < 200 lb (90.7 kg)     She would like to lose weight.  She is walking, raking leaves and working in the yard.        Depression Screen PHQ 2/9 Scores 10/30/2019 11/13/2018  PHQ - 2 Score 2 0  PHQ- 9 Score 3 -    Fall Risk Fall Risk  10/30/2019 11/13/2018  Falls in the past year? 0 0  Risk for fall due to : Medication side effect -  Follow up  Falls evaluation completed;Education provided;Falls prevention discussed -    Is the patient's home free of loose throw rugs in walkways, pet beds, electrical cords, etc?   yes      Grab bars in the bathroom? no      Handrails on the stairs?   yes      Adequate lighting?   yes  Timed Get Up and Go Performed n/a  Cognitive Function:     6CIT Screen 10/30/2019  What Year? 0 points  What month? 0 points  What time? 0 points  Count back from 20 0 points  Months in reverse 0 points  Repeat phrase 0 points  Total Score 0    Screening Tests Health Maintenance  Topic Date Due  . PNA vac Low Risk Adult (1 of 2 - PCV13) 10/29/2020 (Originally 10/18/2018)  . TETANUS/TDAP  09/08/2020  . MAMMOGRAM  12/26/2020  . COLONOSCOPY  08/30/2029  . INFLUENZA VACCINE  Completed  . DEXA SCAN  Completed  . Hepatitis C Screening  Completed    Qualifies for Shingles Vaccine? yes  Cancer Screenings: Lung: Low Dose CT Chest recommended if Age 89-80 years, 30 pack-year currently smoking OR have quit w/in 15years. Patient does not qualify. Breast: Up to date on Mammogram? Yes   Up to date of Bone Density/Dexa? Yes Colorectal: up to date  Additional Screenings:   Hepatitis C Screening: 11/07/2017     Plan:    Patient wants to start drinking more water. States she will write down to remember to drink.  I have personally reviewed and noted the following in the patient's chart:   . Medical and social history . Use of alcohol, tobacco or illicit drugs  . Current  medications and supplements . Functional ability and status . Nutritional status . Physical activity . Advanced directives . List of other physicians . Hospitalizations, surgeries, and ER visits in previous 12 months . Vitals . Screenings to include cognitive, depression, and falls . Referrals and appointments  In addition, I have reviewed and discussed with patient certain preventive protocols, quality metrics, and best practice recommendations. A written personalized care plan for preventive services as well as general preventive health recommendations were provided to patient.     Kellie Simmering, LPN   [phone]

## 2019-11-01 LAB — URINE CULTURE

## 2019-11-05 ENCOUNTER — Other Ambulatory Visit: Payer: Self-pay | Admitting: Nurse Practitioner

## 2019-11-05 DIAGNOSIS — R82998 Other abnormal findings in urine: Secondary | ICD-10-CM

## 2019-11-05 MED ORDER — DOXYCYCLINE HYCLATE 100 MG PO TABS: 100.0000 mg | ORAL_TABLET | Freq: Two times a day (BID) | ORAL | 0 refills | Status: DC

## 2019-11-21 ENCOUNTER — Ambulatory Visit: Payer: Medicare Other

## 2019-11-28 ENCOUNTER — Other Ambulatory Visit: Payer: Self-pay

## 2019-11-28 ENCOUNTER — Ambulatory Visit: Payer: Medicare HMO

## 2019-11-28 ENCOUNTER — Ambulatory Visit (INDEPENDENT_AMBULATORY_CARE_PROVIDER_SITE_OTHER): Payer: Medicare HMO | Admitting: Nurse Practitioner

## 2019-11-28 ENCOUNTER — Encounter: Payer: Self-pay | Admitting: Nurse Practitioner

## 2019-11-28 VITALS — BP 126/80 | HR 78 | Temp 98.3°F | Ht 65.6 in | Wt 246.6 lb

## 2019-11-28 DIAGNOSIS — I491 Atrial premature depolarization: Secondary | ICD-10-CM | POA: Diagnosis not present

## 2019-11-28 DIAGNOSIS — I10 Essential (primary) hypertension: Secondary | ICD-10-CM

## 2019-11-28 DIAGNOSIS — E042 Nontoxic multinodular goiter: Secondary | ICD-10-CM

## 2019-11-28 DIAGNOSIS — Z0000 Encounter for general adult medical examination without abnormal findings: Secondary | ICD-10-CM

## 2019-11-28 DIAGNOSIS — R7303 Prediabetes: Secondary | ICD-10-CM

## 2019-11-28 DIAGNOSIS — Z0001 Encounter for general adult medical examination with abnormal findings: Secondary | ICD-10-CM

## 2019-11-28 DIAGNOSIS — E559 Vitamin D deficiency, unspecified: Secondary | ICD-10-CM

## 2019-11-28 DIAGNOSIS — R9431 Abnormal electrocardiogram [ECG] [EKG]: Secondary | ICD-10-CM

## 2019-11-28 LAB — POCT URINALYSIS DIPSTICK
Bilirubin, UA: NEGATIVE
Blood, UA: NEGATIVE
Glucose, UA: NEGATIVE
Ketones, UA: NEGATIVE
Nitrite, UA: NEGATIVE
Protein, UA: POSITIVE — AB
Spec Grav, UA: 1.025 (ref 1.010–1.025)
Urobilinogen, UA: 0.2 E.U./dL
pH, UA: 5 (ref 5.0–8.0)

## 2019-11-28 MED ORDER — LISINOPRIL-HYDROCHLOROTHIAZIDE 20-25 MG PO TABS: 1.0000 | ORAL_TABLET | Freq: Every day | ORAL | 1 refills | Status: DC

## 2019-11-28 NOTE — Progress Notes (Signed)
This visit occurred during the SARS-CoV-2 public health emergency.  Safety protocols were in place, including screening questions prior to the visit, additional usage of staff PPE, and extensive cleaning of exam room while observing appropriate contact time as indicated for disinfecting solutions.  Subjective:     Patient ID: Linda Mccoy , female    DOB: May 06, 1951 , 68 y.o.   MRN: [id number]   Chief Complaint  Patient presents with  . Annual Exam    HPI  Here for Health Maintenance  Wt Readings from Last 3 Encounters: 11/28/19 : 246 lb 9.6 oz (111.9 kg) 10/30/19 : 243 lb 12.8 oz (110.6 kg) 02/12/19 : 240 lb 9.6 oz (109.1 kg)   Hypertension This is a chronic problem. The current episode started more than 1 year ago. The problem is unchanged. The problem is controlled. Pertinent negatives include no anxiety. There are no associated agents to hypertension. Risk factors for coronary artery disease include obesity.    The patient states she is menopausal.  Mammogram last done 12/26/2018. Negative for: breast discharge, breast lump(s), breast pain and breast self exam. Pertinent negatives include abnormal bleeding (hematology), anxiety, decreased libido, depression, difficulty falling sleep, dyspareunia, history of infertility, nocturia, sexual dysfunction, sleep disturbances, urinary incontinence, urinary urgency, vaginal discharge and vaginal itching. Diet regular, eats increased amounts of sweets and starches. The patient states her exercise level is moderate with walking.  Once the pandemic is improved she would like to go back to the gym.      The patient's tobacco use is:  Social History   Tobacco Use  Smoking Status Never Smoker  Smokeless Tobacco Never Used   She has been exposed to passive smoke. The patient's alcohol use is:  Social History   Substance and Sexual Activity  Alcohol Use Not Currently   Additional information:   Past Medical History:  Diagnosis  Date  . Anemia    History  . Borderline diabetes    no meds  . Depression    no meds  . Fibroids   . Goiter   . History of blood transfusion    approx 20 yrs ago at [facility]  . Hypertension   . Sleep apnea    Does not use CPAP  . SVD (spontaneous vaginal delivery)    x 3     Family History  Problem Relation Age of Onset  . Diabetes Mother   . Hypertension Mother   . Alcohol abuse Mother   . Breast cancer Neg Hx      Current Outpatient Medications:  .  aspirin EC 81 MG tablet, Take 81 mg by mouth daily., Disp: , Rfl:  .  Calcium-Magnesium-Zinc 333-133-5 MG TABS, Take 1 tablet by mouth daily., Disp: , Rfl:  .  Cholecalciferol (VITAMIN D) 2000 UNITS CAPS, Take 2,000 Units by mouth daily., Disp: , Rfl:  .  lisinopril-hydrochlorothiazide (ZESTORETIC) 20-25 MG tablet, TAKE 1 TABLET EVERY DAY, Disp: 90 tablet, Rfl: 0   Allergies  Allergen Reactions  . Penicillins Hives     Review of Systems  Constitutional: Negative.   HENT: Negative.   Eyes: Negative.   Respiratory: Negative.   Cardiovascular: Negative.   Gastrointestinal: Negative.   Endocrine: Negative.   Genitourinary: Negative.   Musculoskeletal: Negative.   Skin: Negative.   Allergic/Immunologic: Negative.   Neurological: Negative.   Hematological: Negative.   Psychiatric/Behavioral: Negative.      Today's Vitals   11/28/19 1003  BP: 126/80  Pulse: 78  Temp: 98.3  F (36.8 C)  TempSrc: Oral  Weight: 246 lb 9.6 oz (111.9 kg)  Height: 5' 5.6" (1.666 m)  PainSc: 0-No pain   Body mass index is 40.29 kg/m.   Objective:  Physical Exam Constitutional:      General: She is not in acute distress.    Appearance: Normal appearance. She is well-developed. She is obese.  HENT:     Head: Normocephalic and atraumatic.     Right Ear: Hearing, tympanic membrane, ear canal and external ear normal.     Left Ear: Hearing, tympanic membrane, ear canal and external ear normal.     Nose: Nose normal.      Mouth/Throat:     Mouth: Mucous membranes are moist.  Eyes:     General: Lids are normal.     Conjunctiva/sclera: Conjunctivae normal.     Pupils: Pupils are equal, round, and reactive to light.     Funduscopic exam:    Right eye: No papilledema.        Left eye: No papilledema.  Neck:     Thyroid: No thyroid mass.     Vascular: No carotid bruit.  Cardiovascular:     Rate and Rhythm: Normal rate and regular rhythm.     Pulses: Normal pulses.     Heart sounds: Normal heart sounds. No murmur.  Pulmonary:     Effort: Pulmonary effort is normal.     Breath sounds: Normal breath sounds.  Chest:     Breasts:        Right: Normal. No mass or tenderness.        Left: Normal. No mass or tenderness.  Abdominal:     General: Abdomen is flat. Bowel sounds are normal.     Palpations: Abdomen is soft.  Musculoskeletal:        General: No swelling or tenderness. Normal range of motion.     Cervical back: Full passive range of motion without pain, normal range of motion and neck supple.     Right lower leg: No edema.     Left lower leg: No edema.  Lymphadenopathy:     Upper Body:     Right upper body: No supraclavicular adenopathy.     Left upper body: No supraclavicular adenopathy.  Skin:    General: Skin is warm and dry.     Capillary Refill: Capillary refill takes less than 2 seconds.  Neurological:     General: No focal deficit present.     Mental Status: She is alert and oriented to person, place, and time.     Cranial Nerves: No cranial nerve deficit.     Sensory: No sensory deficit.  Psychiatric:        Mood and Affect: Mood normal.        Behavior: Behavior normal.        Thought Content: Thought content normal.        Judgment: Judgment normal.         Assessment And Plan:     1. Health maintenance examination . Behavior modifications discussed and diet history reviewed.   . Pt will continue to exercise regularly and modify diet with low GI, plant based foods and  decrease intake of processed foods.  . Recommend intake of daily multivitamin, Vitamin D, and calcium.  . Recommend mammogram and colonoscopy for preventive screenings, as well as recommend immunizations that include influenza, TDAP - Hemoglobin A1c - POCT Urinalysis Dipstick (81002)  2. Hypertension, unspecified type . B/P is  controlled.  . CMP ordered to check renal function.  . The importance of regular exercise and dietary modification was stressed to the patient.  . EKG done with frequent PACs abnormal and changed from previous HR 93 - lisinopril-hydrochlorothiazide (ZESTORETIC) 20-25 MG tablet; Take 1 tablet by mouth daily.  Dispense: 90 tablet; Refill: 1 - CMP14+EGFR - EKG 12-Lead  3. Prediabetes  Chronic, stable  Continue with current medications  Encouraged to limit intake of sugary foods and drinks  Encouraged to increase physical activity to 150 minutes per week as tolerated start slow and increase gradually  4. Vitamin D deficiency  Will check vitamin D level and supplement as needed.     Also encouraged to spend 15 minutes in the sun daily.  - Vitamin D (25 hydroxy)  5. Multiple thyroid nodules  Chronic, no significant change.   Recommended to have a 1 year follow up thyroid ultrasound - CCM Social Work - T4 - TSH - T3, free  6. Abnormal EKG  Multiple PAC's and has changed since last EKG will refer to cardiology for further evaluation - Ambulatory referral to Cardiology   Minette Brine, FNP    THE PATIENT IS ENCOURAGED TO PRACTICE SOCIAL DISTANCING DUE TO THE COVID-19 PANDEMIC.

## 2019-11-29 LAB — CMP14+EGFR
ALT: 14 IU/L (ref 0–32)
AST: 23 IU/L (ref 0–40)
Albumin/Globulin Ratio: 1.2 (ref 1.2–2.2)
Albumin: 4.2 g/dL (ref 3.8–4.8)
Alkaline Phosphatase: 87 IU/L (ref 39–117)
BUN/Creatinine Ratio: 19 (ref 12–28)
BUN: 12 mg/dL (ref 8–27)
Bilirubin Total: 0.3 mg/dL (ref 0.0–1.2)
CO2: 24 mmol/L (ref 20–29)
Calcium: 9.6 mg/dL (ref 8.7–10.3)
Chloride: 98 mmol/L (ref 96–106)
Creatinine, Ser: 0.64 mg/dL (ref 0.57–1.00)
GFR calc Af Amer: 108 mL/min/{1.73_m2} (ref >59)
GFR calc non Af Amer: 93 mL/min/{1.73_m2} (ref >59)
Globulin, Total: 3.4 g/dL (ref 1.5–4.5)
Glucose: 88 mg/dL (ref 65–99)
Potassium: 4.1 mmol/L (ref 3.5–5.2)
Sodium: 137 mmol/L (ref 134–144)
Total Protein: 7.6 g/dL (ref 6.0–8.5)

## 2019-12-02 NOTE — Patient Instructions (Signed)
Health Maintenance  Topic Date Due  . PNA vac Low Risk Adult (1 of 2 - PCV13) 10/29/2020 (Originally 10/18/2018)  . TETANUS/TDAP  09/08/2020  . MAMMOGRAM  12/26/2020  . COLONOSCOPY  08/30/2029  . INFLUENZA VACCINE  Completed  . DEXA SCAN  Completed  . Hepatitis C Screening  Completed   Health Maintenance After Age 66 After age 84, you are at a higher risk for certain long-term diseases and infections as well as injuries from falls. Falls are a major cause of broken bones and head injuries in people who are older than age 68. Getting regular preventive care can help to keep you healthy and well. Preventive care includes getting regular testing and making lifestyle changes as recommended by your health care provider. Talk with your health care provider about:  Which screenings and tests you should have. A screening is a test that checks for a disease when you have no symptoms.  A diet and exercise plan that is right for you. What should I know about screenings and tests to prevent falls? Screening and testing are the best ways to find a health problem early. Early diagnosis and treatment give you the best chance of managing medical conditions that are common after age 28. Certain conditions and lifestyle choices may make you more likely to have a fall. Your health care provider may recommend:  Regular vision checks. Poor vision and conditions such as cataracts can make you more likely to have a fall. If you wear glasses, make sure to get your prescription updated if your vision changes.  Medicine review. Work with your health care provider to regularly review all of the medicines you are taking, including over-the-counter medicines. Ask your health care provider about any side effects that may make you more likely to have a fall. Tell your health care provider if any medicines that you take make you feel dizzy or sleepy.  Osteoporosis screening. Osteoporosis is a condition that causes the bones  to get weaker. This can make the bones weak and cause them to break more easily.  Blood pressure screening. Blood pressure changes and medicines to control blood pressure can make you feel dizzy.  Strength and balance checks. Your health care provider may recommend certain tests to check your strength and balance while standing, walking, or changing positions.  Foot health exam. Foot pain and numbness, as well as not wearing proper footwear, can make you more likely to have a fall.  Depression screening. You may be more likely to have a fall if you have a fear of falling, feel emotionally low, or feel unable to do activities that you used to do.  Alcohol use screening. Using too much alcohol can affect your balance and may make you more likely to have a fall. What actions can I take to lower my risk of falls? General instructions  Talk with your health care provider about your risks for falling. Tell your health care provider if: ? You fall. Be sure to tell your health care provider about all falls, even ones that seem minor. ? You feel dizzy, sleepy, or off-balance.  Take over-the-counter and prescription medicines only as told by your health care provider. These include any supplements.  Eat a healthy diet and maintain a healthy weight. A healthy diet includes low-fat dairy products, low-fat (lean) meats, and fiber from whole grains, beans, and lots of fruits and vegetables. Home safety  Remove any tripping hazards, such as rugs, cords, and clutter.  Install  safety equipment such as grab bars in bathrooms and safety rails on stairs.  Keep rooms and walkways well-lit. Activity   Follow a regular exercise program to stay fit. This will help you maintain your balance. Ask your health care provider what types of exercise are appropriate for you.  If you need a cane or walker, use it as recommended by your health care provider.  Wear supportive shoes that have nonskid  soles. Lifestyle  Do not drink alcohol if your health care provider tells you not to drink.  If you drink alcohol, limit how much you have: ? 0-1 drink a day for women. ? 0-2 drinks a day for men.  Be aware of how much alcohol is in your drink. In the [place] one drink equals one typical bottle of beer (12 oz), one-half glass of wine (5 oz), or one shot of hard liquor (1 oz).  Do not use any products that contain nicotine or tobacco, such as cigarettes and e-cigarettes. If you need help quitting, ask your health care provider. Summary  Having a healthy lifestyle and getting preventive care can help to protect your health and wellness after age 43.  Screening and testing are the best way to find a health problem early and help you avoid having a fall. Early diagnosis and treatment give you the best chance for managing medical conditions that are more common for people who are older than age 1.  Falls are a major cause of broken bones and head injuries in people who are older than age 35. Take precautions to prevent a fall at home.  Work with your health care provider to learn what changes you can make to improve your health and wellness and to prevent falls. This information is not intended to replace advice given to you by your health care provider. Make sure you discuss any questions you have with your health care provider. Document Released: 10/12/2017 Document Revised: 03/22/2019 Document Reviewed: 10/12/2017 Elsevier Patient Education  2020 [employer].

## 2019-12-11 ENCOUNTER — Ambulatory Visit: Payer: Self-pay

## 2019-12-11 DIAGNOSIS — E042 Nontoxic multinodular goiter: Secondary | ICD-10-CM

## 2019-12-11 DIAGNOSIS — I10 Essential (primary) hypertension: Secondary | ICD-10-CM

## 2019-12-11 NOTE — Chronic Care Management (AMB) (Signed)
  Chronic Care Management   Outreach Note  12/11/2019 Name: Linda Mccoy MRN: [id number] DOB: [birth year]  Referred by: Minette Brine, FNP Reason for referral : Care Coordination   An unsuccessful telephone outreach was attempted today. The patient was referred to the case management team by for assistance with care management and care coordination.   Follow Up Plan: A HIPPA compliant phone message was left for the patient providing contact information and requesting a return call.  The care management team will reach out to the patient again over the next 14 days.   Daneen Schick, BSW, CDP Social Worker, Certified Dementia Practitioner [place] [facility] [place] [facility] [phone]

## 2019-12-17 ENCOUNTER — Other Ambulatory Visit: Payer: Medicare HMO

## 2019-12-19 ENCOUNTER — Ambulatory Visit: Payer: Self-pay

## 2019-12-19 DIAGNOSIS — I10 Essential (primary) hypertension: Secondary | ICD-10-CM

## 2019-12-19 NOTE — Chronic Care Management (AMB) (Signed)
  Chronic Care Management   Outreach Note  12/19/2019 Name: Linda Mccoy MRN: [id number] DOB: [birth year]  Referred by: Minette Brine, FNP Reason for referral : Care Coordination   A second unsuccessful telephone outreach was attempted today. The patient was referred to the case management team for assistance with care management and care coordination.   Follow Up Plan: A HIPPA compliant phone message was left for the patient providing contact information and requesting a return call.  The care management team will reach out to the patient again over the next 10 days.   Daneen Schick, BSW, CDP Social Worker, Certified Dementia Practitioner [place] [facility] [place] [facility] [phone]

## 2019-12-24 ENCOUNTER — Ambulatory Visit: Payer: Self-pay

## 2019-12-24 ENCOUNTER — Ambulatory Visit (INDEPENDENT_AMBULATORY_CARE_PROVIDER_SITE_OTHER): Payer: Medicare HMO

## 2019-12-24 DIAGNOSIS — I10 Essential (primary) hypertension: Secondary | ICD-10-CM | POA: Diagnosis not present

## 2019-12-24 DIAGNOSIS — E669 Obesity, unspecified: Secondary | ICD-10-CM

## 2019-12-24 DIAGNOSIS — R7303 Prediabetes: Secondary | ICD-10-CM

## 2019-12-24 NOTE — Chronic Care Management (AMB) (Signed)
  Chronic Care Management   Initial Visit Note  12/24/2019 Name: Linda Mccoy MRN: [id number] DOB: [birth year]  Referred by: Minette Brine, FNP Reason for referral : Chronic Care Management (CCM RN CM Telephone Outreach )   Linda Mccoy is a 69 y.o. year old female who is a primary care patient of Minette Brine, [place]. The care management team was consulted for assistance with chronic disease management and care coordination needs related to HTN and Obesity, Pre-diabetes.  Review of patient status, including review of consultants reports, relevant laboratory and other test results, and collaboration with appropriate care team members and the patient's provider was performed as part of comprehensive patient evaluation and provision of chronic care management services.    I initiated and established the plan of care for Linda Mccoy during one on one collaboration with my clinical care management colleague Daneen Schick BSW who is also engaged with this patient to address social work needs.   Outpatient Encounter Medications as of 12/24/2019  Medication Sig  . aspirin EC 81 MG tablet Take 81 mg by mouth daily.  . Calcium-Magnesium-Zinc 333-133-5 MG TABS Take 1 tablet by mouth daily.  . Cholecalciferol (VITAMIN D) 2000 UNITS CAPS Take 2,000 Units by mouth daily.  Marland Kitchen lisinopril-hydrochlorothiazide (ZESTORETIC) 20-25 MG tablet Take 1 tablet by mouth daily.   No facility-administered encounter medications on file as of 12/24/2019.     Goals Addressed    . Assist with Chronic Care Management and Care Coordination needs       Current Barriers:  Marland Kitchen Knowledge Barriers related to resources and support available to address needs related to Chronic Care Management and Community Resource needs  Case Manager Clinical Goal(s):  Marland Kitchen Over the next 30 days, patient will work with the CCM team to address needs related to disease education and support of chronic conditions, including Care  Coordination and Resource needs.   Interventions:  . Collaborated with BSW and initiated plan of care to address needs related to Chronic Care Management and Care Coordination needs  Patient Self Care Activities:  . Self administers medications as prescribed . Attends all scheduled provider appointments . Calls pharmacy for medication refills . Calls provider office for new concerns or questions  Initial goal documentation        Telephone follow up appointment with care management team member scheduled for: 01/21/20  Barb Merino, RN, BSN, CCM Care Management Coordinator [place] [facility]  Direct Phone: [phone]

## 2019-12-24 NOTE — Chronic Care Management (AMB) (Signed)
  Chronic Care Management   Outreach Note  12/24/2019 Name: Linda Mccoy MRN: [id number] DOB: 11/27/1951  Referred by: Minette Brine, FNP Reason for referral : Care Coordination   Third unsuccessful telephone outreach was attempted today. The patient was referred to the case management team for assistance with care management and care coordination. The patient's primary care provider has been notified of our unsuccessful attempts to make or maintain contact with the patient. The care management team is pleased to engage with this patient at any time in the future should he/she be interested in assistance from the care management team.   Follow Up Plan: No further follow up planned at this time.  Daneen Schick, BSW, CDP Social Worker, Certified Dementia Practitioner [place] [facility] [place] [facility] [phone]

## 2019-12-25 NOTE — Patient Instructions (Signed)
Social Worker Visit Information  Goals we discussed today:  Goals Addressed            This Visit's Progress     Patient Stated   . "I am worried about water leaks in my home" (pt-stated)       Current Barriers:  . Financial constraints related to cost of home repairs . Limited knowledge of community resources to assist with repairs  Social Work Clinical Goal(s):  Marland Kitchen Over the next 60 days the patient will work with SW to become more knowledgeable of local community resources to assist with home repair needs.   CCM SW Interventions: Completed 12/24/19 . Patient interviewed and appropriate assessments performed . Reviewed SDOH screen . Identified concerns within the patients home including leaking bathroom pipes and a roof leak over the laundry room . Assessed for patient ability to qualify for specific programs based on ownership status of the home and income . Educated the patient on [employer] . Placed referral via [id number] for home repair needs . Scheduled follow up call to the patient over the next month to assess goal progression  Patient Self Care Activities:  . Patient verbalizes understanding of plan to work with SW to identify home repair resources . Self administers medications as prescribed . Performs ADL's independently . Performs IADL's independently  Initial goal documentation     . "I had to cancel an appointment to see a specialist due to transportation concerns" (pt-stated)       Current Barriers:  . Limited ability to access endocrinologist due to transportation limitations . Lacks knowledge of [employer] transportation benefit . Limited support persons available to assist with transportation needs due to work schedules  Social Work Clinical Goal(s):  Marland Kitchen Over the next 45 days the patient will work with CM SW to become more knowledgeable of transportation resources to assist with physician appointments.  CCM SW  Interventions: Completed 12/24/2019 . Performed Social Determinants of Health screening . Identified challenges with transportation o "I had to miss my endocrinology appointment because I didn't have a way to get there" . Reviewed Medicare Advantage plan benefits and educated the patient on her transportation benefit . Mailed the patient information on how to schedule future transportation to physician appointments using her [employer] benefit . Advised the patient to contact her endocrinology office to reschedule the appointment  o SW to await return call from the patient to assist with transportation arrangements as needed . Scheduled outbound call to the patient over the next month to confirm receipt of resource  Patient Self Care Activities:  . Patient verbalizes understanding of plan to follow up with SW regarding transportation resources  . Self administers medications as prescribed . Performs ADL's independently . Performs IADL's independently  Initial goal documentation       Other   . Assist with enrollment into care management services and collaborate with RN Case Manager regarding ongoing care coordination needs       Current Barriers:  . Limited ability to manage chronic conditions due to limited knowledge of each condition   Social Work Clinical Goal(s):  Marland Kitchen Over the next 120 days the patient will work with care management team to address care coordination needs and assist with ongoing management of chronic conditions including Hypertension and Obesity  CCM SW Interventions: Completed 12/24/19 . Patient interviewed and appropriate assessments performed o SDOH screening completed to identify food insecurities, patient accesses local food pantry at church to assist with nutritional needs .  Discussed barriers in obtaining food from pantry sites due to high sodium content in most foods and how that may negatively effect patients HTN . Educated the patient on the opportunity to  work with Chief Strategy Officer for disease management of ongoing chronic conditions . Collaboration with RN Case Manager regarding patient enrollment status and ongoing chronic conditions . Advised the patient she would be contact by RN Case Manager over the next 30 days to develop a personalize plan of care  Patient Self Care Activities:  . Patient verbalizes understanding of plan to work with CM team for ongoing care management of chronic conditions . Performs ADL's independently . Performs IADL's independently  Initial goal documentation         Materials provided: Yes: Mailed information on [employer] transportation benefit  Ms. Boller was given information about Chronic Care Management services today including:  1. CCM service includes personalized support from designated clinical staff supervised by her physician, including individualized plan of care and coordination with other care providers 2. 24/7 contact phone numbers for assistance for urgent and routine care needs. 3. Service will only be billed when office clinical staff spend 20 minutes or more in a month to coordinate care. 4. Only one practitioner may furnish and bill the service in a calendar month. 5. The patient may stop CCM services at any time (effective at the end of the month) by phone call to the office staff. 6. The patient will be responsible for cost sharing (co-pay) of up to 20% of the service fee (after annual deductible is met).  Patient agreed to services and verbal consent obtained.   The patient verbalized understanding of instructions provided today and declined a print copy of patient instruction materials.   Follow up plan: SW will follow up with patient by phone over the next month   Daneen Schick, BSW, CDP Social Worker, Certified Dementia Practitioner [place] [facility] [place] [facility] [phone]

## 2019-12-25 NOTE — Chronic Care Management (AMB) (Signed)
Chronic Care Management    Social Work General Note  12/24/19 Name: Linda Mccoy MRN: [id number] DOB: 03-Oct-1951  Linda Mccoy is a 69 y.o. year old female who is a primary care patient of Linda Mccoy, Linda Mccoy. The CCM was consulted to assist the patient with care coordination.   Linda Mccoy was given information about Chronic Care Management services today including:  1. CCM service includes personalized support from designated clinical staff supervised by her physician, including individualized plan of care and coordination with other care providers 2. 24/7 contact phone numbers for assistance for urgent and routine care needs. 3. Service will only be billed when office clinical staff spend 20 minutes or more in a month to coordinate care. 4. Only one practitioner may furnish and bill the service in a calendar month. 5. The patient may stop CCM services at any time (effective at the end of the month) by phone call to the office staff. 6. The patient will be responsible for cost sharing (co-pay) of up to 20% of the service fee (after annual deductible is met).  Patient agreed to services and verbal consent obtained.   Review of patient status, including review of consultants reports, relevant laboratory and other test results, and collaboration with appropriate care team members and the patient's provider was performed as part of comprehensive patient evaluation and provision of chronic care management services.    SDOH (Social Determinants of Health) screening performed today. See Care Plan Entry related to challenges with: Linda Mccoy Insecurity   Outpatient Encounter Medications as of 12/24/2019  Medication Sig  . aspirin EC 81 MG tablet Take 81 mg by mouth daily.  . Calcium-Magnesium-Zinc 333-133-5 MG TABS Take 1 tablet by mouth daily.  . Cholecalciferol (VITAMIN D) 2000 UNITS CAPS Take 2,000 Units by mouth daily.  Marland Kitchen lisinopril-hydrochlorothiazide  (ZESTORETIC) 20-25 MG tablet Take 1 tablet by mouth daily.   No facility-administered encounter medications on file as of 12/24/2019.    Goals Addressed            This Visit's Progress     Patient Stated   . "I am worried about water leaks in my home" (pt-stated)       Current Barriers:  . Financial constraints related to cost of home repairs . Limited knowledge of community resources to assist with repairs  Social Work Clinical Goal(s):  Marland Kitchen Over the next 60 days the patient will work with SW to become more knowledgeable of local community resources to assist with home repair needs.   CCM SW Interventions: Completed 12/24/19 . Patient interviewed and appropriate assessments performed . Reviewed SDOH screen . Identified concerns within the patients home including leaking bathroom pipes and a roof leak over the laundry room . Assessed for patient ability to qualify for specific programs based on ownership status of the home and income . Educated the patient on [employer] . Placed referral via [id number] for home repair needs . Scheduled follow up call to the patient over the next month to assess goal progression  Patient Self Care Activities:  . Patient verbalizes understanding of plan to work with SW to identify home repair resources . Self administers medications as prescribed . Performs ADL's independently . Performs IADL's independently  Initial goal documentation     . "I had to cancel an appointment to see a specialist due to transportation concerns" (pt-stated)       Current Barriers:  . Limited ability to access endocrinologist  due to transportation limitations . Lacks knowledge of Linda Mccoy transportation benefit . Limited support persons available to assist with transportation needs due to work schedules  Social Work Clinical Goal(s):  Marland Kitchen Over the next 45 days the patient will work with CM SW to become more knowledgeable of transportation  resources to assist with physician appointments.  CCM SW Interventions: Completed 12/24/2019 . Performed Social Determinants of Health screening . Identified challenges with transportation o "I had to miss my endocrinology appointment because I didn't have a way to get there" . Reviewed Medicare Advantage plan benefits and educated the patient on her transportation benefit . Mailed the patient information on how to schedule future transportation to physician appointments using her Linda Mccoy benefit . Advised the patient to contact her endocrinology office to reschedule the appointment  o SW to await return call from the patient to assist with transportation arrangements as needed . Scheduled outbound call to the patient over the next month to confirm receipt of resource  Patient Self Care Activities:  . Patient verbalizes understanding of plan to follow up with SW regarding transportation resources  . Self administers medications as prescribed . Performs ADL's independently . Performs IADL's independently  Initial goal documentation       Other   . Assist with enrollment into care management services and collaborate with RN Case Manager regarding ongoing care coordination needs       Current Barriers:  . Limited ability to manage chronic conditions due to limited knowledge of each condition   Social Work Clinical Goal(s):  Marland Kitchen Over the next 120 days the patient will work with care management team to address care coordination needs and assist with ongoing management of chronic conditions including Hypertension and Obesity  CCM SW Interventions: Completed 12/24/19 . Patient interviewed and appropriate assessments performed o SDOH screening completed to identify food insecurities, patient accesses local food pantry at church to assist with nutritional needs . Discussed barriers in obtaining food from pantry sites due to high sodium content in most foods and how that may negatively effect  patients HTN . Educated the patient on the opportunity to work with Chief Strategy Officer for disease management of ongoing chronic conditions . Collaboration with RN Case Manager regarding patient enrollment status and ongoing chronic conditions . Advised the patient she would be contact by RN Case Manager over the next 30 days to develop a personalize plan of care  Patient Self Care Activities:  . Patient verbalizes understanding of plan to work with CM team for ongoing care management of chronic conditions . Performs ADL's independently . Performs IADL's independently  Initial goal documentation         Follow Up Plan: SW will follow up with patient by phone over the next month       Daneen Schick, BSW, CDP Social Worker, Certified Dementia Practitioner [facility] [phone]  Total time spent performing care coordination and/or care management activities with the patient by phone or face to face = 14 minutes.

## 2019-12-31 ENCOUNTER — Telehealth: Payer: Self-pay

## 2019-12-31 NOTE — Telephone Encounter (Signed)
Patient is unable to come back for labs due to transportation. YRL,RMA

## 2019-12-31 NOTE — Telephone Encounter (Signed)
-----   Message from Minette Brine, [place] sent at 12/30/2019 11:32 PM EST ----- See if she has any results, shows active ----- Message ----- From: SYSTEM Sent: 12/03/2019  12:05 AM EST To: Minette Brine, FNP

## 2020-01-04 ENCOUNTER — Other Ambulatory Visit: Payer: Self-pay | Admitting: Nurse Practitioner

## 2020-01-04 DIAGNOSIS — Z1231 Encounter for screening mammogram for malignant neoplasm of breast: Secondary | ICD-10-CM

## 2020-01-15 ENCOUNTER — Ambulatory Visit: Payer: Medicare HMO | Admitting: Interventional Cardiology

## 2020-01-15 ENCOUNTER — Encounter: Payer: Self-pay | Admitting: Interventional Cardiology

## 2020-01-15 ENCOUNTER — Other Ambulatory Visit: Payer: Self-pay

## 2020-01-15 ENCOUNTER — Telehealth: Payer: Self-pay | Admitting: Radiology

## 2020-01-15 VITALS — BP 126/72 | HR 55 | Ht 64.0 in | Wt 249.0 lb

## 2020-01-15 DIAGNOSIS — I491 Atrial premature depolarization: Secondary | ICD-10-CM

## 2020-01-15 DIAGNOSIS — I10 Essential (primary) hypertension: Secondary | ICD-10-CM

## 2020-01-15 DIAGNOSIS — R002 Palpitations: Secondary | ICD-10-CM | POA: Diagnosis not present

## 2020-01-15 NOTE — Progress Notes (Signed)
Cardiology Office Note   Date:  01/15/2020   ID:  Zafina, Aldava 1951-05-07, MRN [id number]  PCP:  Minette Brine, FNP    No chief complaint on file.  Abnormal ECG  Wt Readings from Last 3 Encounters:  01/15/20 249 lb (112.9 kg)  11/28/19 246 lb 9.6 oz (111.9 kg)  10/30/19 243 lb 12.8 oz (110.6 kg)       History of Present Illness: Linda Mccoy is a 69 y.o. female who is being seen today for the evaluation of PACs at the request of Minette Brine, [place].  ECG was done in 11/2019 showed NSR with frequent PACs.  It did appear there was a PVC as well. This was a change so she was referred for further evaluation.   She does not report any sx during the ECG she had back in 11/2019.    She can have occasional palpitations at night when she lies down.  She tries to change position and sx will improve.   She has some DOE.  No chest pain with walking.   Walks more in warmer weather.    She reports "not eating right."  She is trying to improve the diet. She is not eating vegetables.  She eats a lot of carbs.  Denies : Chest pain. Dizziness. Leg edema. Nitroglycerin use. Orthopnea. Paroxysmal nocturnal dyspnea.  Syncope.   Past Medical History:  Diagnosis Date  . Anemia    History  . Borderline diabetes    no meds  . Depression    no meds  . Endometrial polyp 05/30/2014  . Fibroids   . Goiter   . History of blood transfusion    approx 20 yrs ago at [facility]  . Hypertension   . Multiple thyroid nodules 11/13/2018  . Obesity, unspecified 05/30/2014  . Postmenopausal bleeding 05/30/2014  . Prediabetes 02/12/2019  . Sleep apnea    Does not use CPAP  . SVD (spontaneous vaginal delivery)    x 3  . Unspecified sleep apnea 05/30/2014  . Vitamin D deficiency 11/29/2018    Past Surgical History:  Procedure Laterality Date  . COLONOSCOPY    . DILATATION & CURRETTAGE/HYSTEROSCOPY WITH RESECTOCOPE N/A 05/30/2014   Procedure: DILATATION & CURETTAGE/HYSTEROSCOPY WITH  RESECTion of endometrial polyp;  Surgeon: Ena Dawley, MD;  Location: [facility];  Service: Gynecology;  Laterality: N/A;  . TUBAL LIGATION       Current Outpatient Medications  Medication Sig Dispense Refill  . Specialty Vitamins Products (BIOTIN PLUS KERATIN) 10000-100 MCG-MG TABS Take 1 tablet by mouth daily.    Marland Kitchen aspirin EC 81 MG tablet Take 81 mg by mouth daily.    . Calcium-Magnesium-Zinc 333-133-5 MG TABS Take 1 tablet by mouth daily.    . Cholecalciferol (VITAMIN D) 2000 UNITS CAPS Take 2,000 Units by mouth daily.    Marland Kitchen lisinopril-hydrochlorothiazide (ZESTORETIC) 20-25 MG tablet Take 1 tablet by mouth daily. 90 tablet 1   No current facility-administered medications for this visit.    Allergies:   Penicillins    Social History:  The patient  reports that she has never smoked. She has never used smokeless tobacco. She reports previous alcohol use. She reports that she does not use drugs.   Family History:  The patient's family history includes Alcohol abuse in her mother; Diabetes in her mother; Hypertension in her mother.    ROS:  Please see the history of present illness.   Otherwise, review of systems are positive for weight gain.  All other systems are reviewed and negative.    PHYSICAL EXAM: VS:  BP 126/72   Pulse (!) 55   Ht 5\' 4"  (1.626 m)   Wt 249 lb (112.9 kg)   LMP 12/26/2010   SpO2 94%   BMI 42.74 kg/m  , BMI Body mass index is 42.74 kg/m. GEN: Well nourished, well developed, in no acute distress  HEENT: normal  Neck: no JVD, carotid bruits, or masses Cardiac: RRR; no murmurs, rubs, or gallops,no edema  Respiratory:  clear to auscultation bilaterally, normal work of breathing GI: soft, nontender, nondistended, + BS MS: no deformity or atrophy  Skin: warm and dry, no rash Neuro:  Strength and sensation are intact Psych: euthymic mood, full affect   EKG:   The ekg ordered in 11/2019 , reading as noted above   Recent Labs: 02/12/2019: TSH  0.198 11/28/2019: ALT 14; BUN 12; Creatinine, Ser 0.64; Potassium 4.1; Sodium 137   Lipid Panel    Component Value Date/Time   CHOL 173 11/13/2018 1031   TRIG 67 11/13/2018 1031   HDL 80 11/13/2018 1031   CHOLHDL 2.2 11/13/2018 1031   CHOLHDL 2 09/03/2013 0852   VLDL 11.2 09/03/2013 0852   LDLCALC 80 11/13/2018 1031     Other studies Reviewed: Additional studies/ records that were reviewed today with results demonstrating: prior ECG reviewed; labs reviewed .   ASSESSMENT AND PLAN:  1. Palpitations: Plan for Zio monitor.  Will see what the percentage of PACs are.   2. PACs/PVC:  I explained that if that was more than 20%, this could be a problem. 3. HTN: The current medical regimen is effective;  continue present plan and medications. 4. Healthy diet and regular exercise discussed.   Current medicines are reviewed at length with the patient today.  The patient concerns regarding her medicines were addressed.  The following changes have been made:  No change  Labs/ tests ordered today include:  No orders of the defined types were placed in this encounter.   Recommend 150 minutes/week of aerobic exercise Low fat, low carb, high fiber diet recommended  Disposition:   FU in based on test results   Signed, Larae Grooms, MD  01/15/2020 11:24 AM    [facility] [address], [place], [place]  [address] Phone: [phone]; Fax: [phone]

## 2020-01-15 NOTE — Telephone Encounter (Signed)
Enrolled patient for a 14 day Zio monitor to be mailed to patients home.  

## 2020-01-15 NOTE — Patient Instructions (Signed)
Medication Instructions:  Your physician recommends that you continue on your current medications as directed. Please refer to the Current Medication list given to you today.  If you need a refill on your cardiac medications before your next appointment, please call your pharmacy.   Lab work: None Ordered  If you have labs (blood work) drawn today and your tests are completely normal, you will receive your results only by: Marland Kitchen MyChart Message (if you have MyChart) OR . A paper copy in the mail If you have any lab test that is abnormal or we need to change your treatment, we will call you to review the results.  Testing/Procedures: Your physician has recommended that you wear a 14 day monitor. These monitors are medical devices that record the heart's electrical activity. Doctors most often use these monitors to diagnose arrhythmias. Arrhythmias are problems with the speed or rhythm of the heartbeat. The monitor is a small, portable device. You can wear one while you do your normal daily activities. This is usually used to diagnose what is causing palpitations/syncope (passing out).  Follow-Up: . Based on test results  Any Other Special Instructions Will Be Listed Below (If Applicable).  ZIO XT- Long Term Monitor Instructions   Your physician has requested you wear your ZIO patch monitor 14 days.   This is a single patch monitor.  [employer] supplies one patch monitor per enrollment.  Additional stickers are not available.   Please do not apply patch if you will be having a Nuclear Stress Test, Echocardiogram, Cardiac CT, MRI, or Chest Xray during the time frame you would be wearing the monitor. The patch cannot be worn during these tests.  You cannot remove and re-apply the ZIO XT patch monitor.   Your ZIO patch monitor will be sent USPS Priority mail from [facility] directly to your home address. The monitor may also be mailed to a PO BOX if home delivery is not available.   It may  take 3-5 days to receive your monitor after you have been enrolled.   Once you have received you monitor, please review enclosed instructions.  Your monitor has already been registered assigning a specific monitor serial # to you.   Applying the monitor   Shave hair from upper left chest.   Hold abrader disc by orange tab.  Rub abrader in 40 strokes over left upper chest as indicated in your monitor instructions.   Clean area with 4 enclosed alcohol pads .  Use all pads to assure are is cleaned thoroughly.  Let dry.   Apply patch as indicated in monitor instructions.  Patch will be place under collarbone on left side of chest with arrow pointing upward.   Rub patch adhesive wings for 2 minutes.Remove white label marked "1".  Remove white label marked "2".  Rub patch adhesive wings for 2 additional minutes.   While looking in a mirror, press and release button in center of patch.  A small green light will flash 3-4 times .  This will be your only indicator the monitor has been turned on.     Do not shower for the first 24 hours.  You may shower after the first 24 hours.   Press button if you feel a symptom. You will hear a small click.  Record Date, Time and Symptom in the Patient Log Book.   When you are ready to remove patch, follow instructions on last 2 pages of Patient Log Book.  Stick patch monitor onto  last page of Patient Log Book.   Place Patient Log Book in [place] box.  Use locking tab on box and tape box closed securely.  The [employer] has [employer] on it.  Please place in mailbox as soon as possible.  Your physician should have your test results approximately 7 days after the monitor has been mailed back to [facility].   Call [place] at [phone] if you have questions regarding your ZIO XT patch monitor.  Call them immediately if you see an orange light blinking on your monitor.   If your monitor falls off in less than 4 days contact  our Monitor department at [phone].  If your monitor becomes loose or falls off after 4 days call [employer] at [phone] for suggestions on securing your monitor.

## 2020-01-18 ENCOUNTER — Ambulatory Visit (INDEPENDENT_AMBULATORY_CARE_PROVIDER_SITE_OTHER): Payer: Medicare HMO

## 2020-01-18 DIAGNOSIS — R002 Palpitations: Secondary | ICD-10-CM

## 2020-01-21 ENCOUNTER — Ambulatory Visit (INDEPENDENT_AMBULATORY_CARE_PROVIDER_SITE_OTHER): Payer: Medicare HMO

## 2020-01-21 ENCOUNTER — Telehealth: Payer: Self-pay

## 2020-01-21 ENCOUNTER — Other Ambulatory Visit: Payer: Self-pay

## 2020-01-21 DIAGNOSIS — R7989 Other specified abnormal findings of blood chemistry: Secondary | ICD-10-CM

## 2020-01-21 DIAGNOSIS — R9431 Abnormal electrocardiogram [ECG] [EKG]: Secondary | ICD-10-CM

## 2020-01-21 DIAGNOSIS — R7303 Prediabetes: Secondary | ICD-10-CM

## 2020-01-21 DIAGNOSIS — E669 Obesity, unspecified: Secondary | ICD-10-CM

## 2020-01-21 DIAGNOSIS — E042 Nontoxic multinodular goiter: Secondary | ICD-10-CM | POA: Diagnosis not present

## 2020-01-21 DIAGNOSIS — I10 Essential (primary) hypertension: Secondary | ICD-10-CM

## 2020-01-21 NOTE — Telephone Encounter (Signed)
Pt LVM about coming in for missed lab work LVM for pt to call the office to let us know when she would be ava to come in

## 2020-01-22 NOTE — Patient Instructions (Signed)
Visit Information  Goals Addressed      Patient Stated   . "I am having palpitations and having to wear a heart monitor" (pt-stated)       Current Barriers:  Marland Kitchen Knowledge Deficits related to diagnosis and treatment for abnormal EKG . Chronic Disease Management support and education needs related to Abnormal EKG, HTN, Obesity, Prediabetes  Nurse Case Manager Clinical Goal(s):  Marland Kitchen Over the next 90 days, patient will work with her health care team to address needs related to disease education and support for diagnosis/treatment plan for Abnormal EKG  . Over the next 30 days, patient will have completed her ordered blood work including her TSH level at PCP office as prescribed by PCP  Interventions:  . Determined patient f/u with Cardiologist, Dr. Irish Lack to further evaluate her abnormal EKG and c/o increased heart palpitations and is prescribed to wear a Zio heart monitor x 14 days in which she is doing   . Evaluation of current treatment plan related to HTN and Abnormal EKG and patient's adherence to plan as established by provider. . Provided education to patient re: importance of medication, diet and exercise adherence for most effectiveness for BP control; education provided related to the link between thyroid disease and hypertension, and abnormal thyroid function that may lead to heart palpitations; provided education related to target BP <130/80; Determined patient is currently at target BP  . Reviewed medications with patient and discussed frequency, dosage and indication for antihypertensive medication; patient denies having financial hardship or AE . Discussed plans with patient for ongoing care management follow up and provided patient with direct contact information for care management team . Advised patient, providing education and rationale, to monitor blood pressure daily and record, calling the CCM team and or PCP for findings outside established parameters.  . Provided patient with  printed educational materials related to [employer], Is there a link between thyroid disease hypertension?  Patient Self Care Activities:  . Self administers medications as prescribed . Attends all scheduled provider appointments . Calls pharmacy for medication refills . Performs ADL's independently . Performs IADL's independently . Calls provider office for new concerns or questions  Initial goal documentation     . "I don't know if I have thyroid disease" (pt-stated)       Current Barriers:  Marland Kitchen Knowledge Deficits related to evaluation and treatment of thyroid disease . Chronic Disease Management support and education needs related to Abnormal TSH, Multi-thyroid nodules, HTN, Obesity, Prediabetes  Nurse Case Manager Clinical Goal(s):  Marland Kitchen Over the next 90 days, patient will work with CCM RN CM and PCP to address needs related to disease management, education and support to help increase knowledge and understanding of thyroid disease and Self Health management of this condition  . Over the next 30 days, patient will have completed her ordered blood work including her TSH level at PCP office as prescribed by PCP  Interventions:  . Determined patient has incomplete labs including a TSH following her OV with PCP provider Minette Brine, FNP completed on 11/28/19 . Determined patient is unsure if she has thyroid disease and is receiving no treatment at this time for this condition and did not f/u with the ENT specialist referred by PCP provider Minette Brine, FNP following her last OV . Discussed patient previously had a transportation barrier but this is no longer an issue; Instructed patient to call the [place] office to schedule a lab visit in order to complete her labs ordered  in December so that her PCP can determine if further evaluation and treatment is needed, patient agreed to go in this week to complete this blood work  . Evaluation of current treatment plan related to Abnormal TSH,  Multi-thyroid nodules and patient's adherence to plan as established by provider. . Advised patient to of past referral by PCP for ENT to evaluate thyroid nodules; determined patient did not f/u with this specialist . Provided education to patient re: past abnormal TSH suggesting she has an overactive thyroid; Reviewed past imaging to confirm multi-thyroid nodules; educated patient on basic disease process of hyperthyroidism, potential cause and treatment  . Reviewed medications with patient and discussed patient is not prescribed to take thyroid medication at this time . Collaborated with PCP provider Minette Brine, FNP regarding patient's incomplete labs previously ordered including a TSH; advised patient will be calling the office to schedule a lab visit . Discussed plans with patient for ongoing care management follow up and provided patient with direct contact information for care management team . Provided patient with printed  educational materials related to Is Your Thyroid Disease Causing Your High Blood Pressure?; What Women Need to Know About Thyroid Disease  Patient Self Care Activities:  . Patient verbalizes understanding of plan to call the PCP office to schedule a lab visit for blood work to evaluate her TSH level . Self administers medications as prescribed . Attends all scheduled provider appointments . Calls pharmacy for medication refills . Performs ADL's independently . Performs IADL's independently . Calls provider office for new concerns or questions  Initial goal documentation     . "I don't now if I have diabetes" (pt-stated)       Current Barriers:  Marland Kitchen Knowledge Deficits related to disease process and Self Health Management of Pre-diabetes . Chronic Disease Management support and education needs related to Pre-diabetes, HTN, Obesity,   Nurse Case Manager Clinical Goal(s):  Marland Kitchen Over the next 90 days, patient will work with the CCM team and PCP to address needs related to  disease education and support for improved Self Health management of Diabetes . Over the next 30 days, patient will schedule a lab visit with her PCP office to complete ordered blood work for evaluation of Pre-diabetes   Interventions:  . Evaluation of current treatment plan related to Pre-diabetes and patient's adherence to plan as established by provider. . Provided education to patient re: past A1C indicated pre-diabetes; Determined patient did not complete ordered blood work to re-evaluate her A1C following her 11/28/19 PCP OV; Encouraged patient to get this done ASAP so her PCP and the CCM RN CM can determine if further evaluation/education is needed to help support and better manage this disease process, pt is agreeable; Determined patient was having transportation issues in the past but this has been resolved . Discussed plans with patient for ongoing care management follow up and provided patient with direct contact information for care management team . Provided patient with printed educational materials related to Diabetes Management Using the Plate, Carb Counting, Carb Choices   Patient Self Care Activities:  . Patient verbalizes understanding of plan to contact the PCP office to schedule a lab visit to complete ordered blood work . Self administers medications as prescribed . Attends all scheduled provider appointments . Calls pharmacy for medication refills . Performs ADL's independently . Performs IADL's independently . Calls provider office for new concerns or questions  Initial goal documentation     . "I had to cancel an  appointment to see a specialist due to transportation concerns" (pt-stated)   On track    Current Barriers:  . Limited ability to access endocrinologist due to transportation limitations . Lacks knowledge of [employer] transportation benefit . Limited support persons available to assist with transportation needs due to work schedules  Social Work Clinical  Goal(s):  Marland Kitchen Over the next 45 days the patient will work with CM SW to become more knowledgeable of transportation resources to assist with physician appointments.  CCM SW Interventions: Completed 12/24/2019 . Performed Social Determinants of Health screening . Identified challenges with transportation o "I had to miss my endocrinology appointment because I didn't have a way to get there" . Reviewed Medicare Advantage plan benefits and educated the patient on her transportation benefit . Mailed the patient information on how to schedule future transportation to physician appointments using her [employer] benefit . Advised the patient to contact her endocrinology office to reschedule the appointment  o SW to await return call from the patient to assist with transportation arrangements as needed . Scheduled outbound call to the patient over the next month to confirm receipt of resource  Patient Self Care Activities:  . Patient verbalizes understanding of plan to follow up with SW regarding transportation resources  . Self administers medications as prescribed . Performs ADL's independently . Performs IADL's independently  Initial goal documentation     . "I need to learn how to eat better" (pt-stated)       Current Barriers:  Marland Kitchen Knowledge Deficits related to how and what to eat to help improve nutrition, hydration and weight loss  . Chronic Disease Management support and education needs related to Obesity, HTN, Pre-diabetes  Nurse Case Manager Clinical Goal(s):  Marland Kitchen Over the next 90 days, patient will meet with RN Care Manager to address disease education and support to increase knowledge and understanding of how to make healthy food choices AEB by patient will lower her BMI and promote weight loss  Interventions:  . Evaluation of current treatment plan related to Obesity and patient's adherence to plan as established by provider. . Provided education to patient re: the importance of  establishing short term realistic weight loss goals to help be successful as she implements healthier food choices for daily consumption; encouraged to keep a food journal and slowly substitute unhealthy foods for healthy foods one day or one week at a time; Provided active listening and support to patient regarding her desire to "eat healthier" . Discussed plans with patient for ongoing care management follow up and provided patient with direct contact information for care management team . Provided patient with printed educational materials related to How Should I Start: Taking Charge of Your Health and Well Being  Patient Self Care Activities:  . Self administers medications as prescribed . Attends all scheduled provider appointments . Calls pharmacy for medication refills . Performs ADL's independently . Performs IADL's independently . Calls provider office for new concerns or questions  Initial goal documentation       Other   . COMPLETED: Assist with Chronic Care Management and Care Coordination needs       Current Barriers:  Marland Kitchen Knowledge Barriers related to resources and support available to address needs related to Chronic Care Management and Community Resource needs  Case Manager Clinical Goal(s):  Marland Kitchen Over the next 30 days, patient will work with the CCM team to address needs related to disease education and support of chronic conditions, including Care Coordination and Resource needs.  Interventions:  . Collaborated with BSW and initiated plan of care to address needs related to Chronic Care Management and Care Coordination needs  Patient Self Care Activities:  . Self administers medications as prescribed . Attends all scheduled provider appointments . Calls pharmacy for medication refills . Calls provider office for new concerns or questions  Initial goal documentation       The patient verbalized understanding of instructions provided today and declined a print copy  of patient instruction materials.   Telephone follow up appointment with care management team member scheduled for:02/25/20  Barb Merino, RN, BSN, CCM Care Management Coordinator [facility]  Direct Phone: [phone]

## 2020-01-22 NOTE — Chronic Care Management (AMB) (Signed)
Chronic Care Management   Initial Visit Note  01/21/2020 Name: Linda Mccoy MRN: [id number] DOB: 01-07-1951  Referred by: Minette Brine, FNP Reason for referral : Chronic Care Management (#1 Attempt New CCM RNCM Telephone Outreach )   Linda Mccoy is a 69 y.o. year old female who is a primary care patient of Minette Brine, [place]. The CCM team was consulted for assistance with chronic disease management and care coordination needs related to HTN, DMII and Obesity, Abnormal EKG, Abnormal TSH, Multi-thyroid nodules  Review of patient status, including review of consultants reports, relevant laboratory and other test results, and collaboration with appropriate care team members and the patient's provider was performed as part of comprehensive patient evaluation and provision of chronic care management services.    SDOH (Social Determinants of Health) screening performed today: None. See Care Plan for related entries.   Placed outbound initial CCM RN CM call to patient to assess for CCM RN needs and a care plan was established.   Medications: Outpatient Encounter Medications as of 01/21/2020  Medication Sig  . aspirin EC 81 MG tablet Take 81 mg by mouth daily.  . Calcium-Magnesium-Zinc 333-133-5 MG TABS Take 1 tablet by mouth daily.  . Cholecalciferol (VITAMIN D) 2000 UNITS CAPS Take 2,000 Units by mouth daily.  Marland Kitchen lisinopril-hydrochlorothiazide (ZESTORETIC) 20-25 MG tablet Take 1 tablet by mouth daily.  Marland Kitchen Specialty Vitamins Products (BIOTIN PLUS KERATIN) 10000-100 MCG-MG TABS Take 1 tablet by mouth daily.   No facility-administered encounter medications on file as of 01/21/2020.     Objective:  Lab Results  Component Value Date   HGBA1C 5.9 (H) 11/13/2018   HGBA1C 6.2 09/03/2013   Lab Results  Component Value Date   MICROALBUR 80 10/30/2019   LDLCALC 80 11/13/2018   CREATININE 0.64 11/28/2019   BP Readings from Last 3 Encounters:  01/15/20 126/72  11/28/19 126/80    10/30/19 124/70    Goals Addressed      Patient Stated   . "I am having palpitations and having to wear a heart monitor" (pt-stated)       Current Barriers:  Marland Kitchen Knowledge Deficits related to diagnosis and treatment for abnormal EKG . Chronic Disease Management support and education needs related to Abnormal EKG, HTN, Obesity, Prediabetes  Nurse Case Manager Clinical Goal(s):  Marland Kitchen Over the next 90 days, patient will work with her health care team to address needs related to disease education and support for diagnosis/treatment plan for Abnormal EKG  . Over the next 30 days, patient will have completed her ordered blood work including her TSH level at PCP office as prescribed by PCP  Interventions:  . Determined patient f/u with Cardiologist, Dr. Irish Lack to further evaluate her abnormal EKG and c/o increased heart palpitations and is prescribed to wear a Zio heart monitor x 14 days in which she is doing   . Evaluation of current treatment plan related to HTN and Abnormal EKG and patient's adherence to plan as established by provider. . Provided education to patient re: importance of medication, diet and exercise adherence for most effectiveness for BP control; education provided related to the link between thyroid disease and hypertension, and abnormal thyroid function that may lead to heart palpitations; provided education related to target BP <130/80; Determined patient is currently at target BP  . Reviewed medications with patient and discussed frequency, dosage and indication for antihypertensive medication; patient denies having financial hardship or AE . Discussed plans with patient for ongoing care management follow  up and provided patient with direct contact information for care management team . Advised patient, providing education and rationale, to monitor blood pressure daily and record, calling the CCM team and or PCP for findings outside established parameters.  . Provided patient  with printed educational materials related to Life's Simple [employer], Is there a link between thyroid disease hypertension?  Patient Self Care Activities:  . Self administers medications as prescribed . Attends all scheduled provider appointments . Calls pharmacy for medication refills . Performs ADL's independently . Performs IADL's independently . Calls provider office for new concerns or questions  Initial goal documentation     . "I don't know if I have thyroid disease" (pt-stated)       Current Barriers:  Marland Kitchen Knowledge Deficits related to evaluation and treatment of thyroid disease . Chronic Disease Management support and education needs related to Abnormal TSH, Multi-thyroid nodules, HTN, Obesity, Prediabetes  Nurse Case Manager Clinical Goal(s):  Marland Kitchen Over the next 90 days, patient will work with CCM RN CM and PCP to address needs related to disease management, education and support to help increase knowledge and understanding of thyroid disease and Self Health management of this condition  . Over the next 30 days, patient will have completed her ordered blood work including her TSH level at PCP office as prescribed by PCP  Interventions:  . Determined patient has incomplete labs including a TSH following her OV with PCP provider Minette Brine, FNP completed on 11/28/19 . Determined patient is unsure if she has thyroid disease and is receiving no treatment at this time for this condition and did not f/u with the ENT specialist referred by PCP provider Minette Brine, FNP following her last OV . Discussed patient previously had a transportation barrier but this is no longer an issue; Instructed patient to call the [place] office to schedule a lab visit in order to complete her labs ordered in December so that her PCP can determine if further evaluation and treatment is needed, patient agreed to go in this week to complete this blood work  . Evaluation of current treatment plan related to Abnormal TSH,  Multi-thyroid nodules and patient's adherence to plan as established by provider. . Advised patient to of past referral by PCP for ENT to evaluate thyroid nodules; determined patient did not f/u with this specialist . Provided education to patient re: past abnormal TSH suggesting she has an overactive thyroid; Reviewed past imaging to confirm multi-thyroid nodules; educated patient on basic disease process of hyperthyroidism, potential cause and treatment  . Reviewed medications with patient and discussed patient is not prescribed to take thyroid medication at this time . Collaborated with PCP provider Minette Brine, FNP regarding patient's incomplete labs previously ordered including a TSH; advised patient will be calling the office to schedule a lab visit . Discussed plans with patient for ongoing care management follow up and provided patient with direct contact information for care management team . Provided patient with printed  educational materials related to Is Your Thyroid Disease Causing Your High Blood Pressure?; What Women Need to Know About Thyroid Disease  Patient Self Care Activities:  . Patient verbalizes understanding of plan to call the PCP office to schedule a lab visit for blood work to evaluate her TSH level . Self administers medications as prescribed . Attends all scheduled provider appointments . Calls pharmacy for medication refills . Performs ADL's independently . Performs IADL's independently . Calls provider office for new concerns or questions  Initial goal  documentation     . "I don't now if I have diabetes" (pt-stated)       Current Barriers:  Marland Kitchen Knowledge Deficits related to disease process and Self Health Management of Pre-diabetes . Chronic Disease Management support and education needs related to Pre-diabetes, HTN, Obesity,   Nurse Case Manager Clinical Goal(s):  Marland Kitchen Over the next 90 days, patient will work with the CCM team and PCP to address needs related to  disease education and support for improved Self Health management of Diabetes . Over the next 30 days, patient will schedule a lab visit with her PCP office to complete ordered blood work for evaluation of Pre-diabetes   Interventions:  . Evaluation of current treatment plan related to Pre-diabetes and patient's adherence to plan as established by provider. . Provided education to patient re: past A1C indicated pre-diabetes; Determined patient did not complete ordered blood work to re-evaluate her A1C following her 11/28/19 PCP OV; Encouraged patient to get this done ASAP so her PCP and the CCM RN CM can determine if further evaluation/education is needed to help support and better manage this disease process, pt is agreeable; Determined patient was having transportation issues in the past but this has been resolved . Discussed plans with patient for ongoing care management follow up and provided patient with direct contact information for care management team . Provided patient with printed educational materials related to Diabetes Management Using the Plate, Carb Counting, Carb Choices   Patient Self Care Activities:  . Patient verbalizes understanding of plan to contact the PCP office to schedule a lab visit to complete ordered blood work . Self administers medications as prescribed . Attends all scheduled provider appointments . Calls pharmacy for medication refills . Performs ADL's independently . Performs IADL's independently . Calls provider office for new concerns or questions  Initial goal documentation     . "I had to cancel an appointment to see a specialist due to transportation concerns" (pt-stated)   On track    Current Barriers:  . Limited ability to access endocrinologist due to transportation limitations . Lacks knowledge of [employer] transportation benefit . Limited support persons available to assist with transportation needs due to work schedules  Social Work Clinical  Goal(s):  Marland Kitchen Over the next 45 days the patient will work with CM SW to become more knowledgeable of transportation resources to assist with physician appointments.  CCM SW Interventions: Completed 12/24/2019 . Performed Social Determinants of Health screening . Identified challenges with transportation o "I had to miss my endocrinology appointment because I didn't have a way to get there" . Reviewed Medicare Advantage plan benefits and educated the patient on her transportation benefit . Mailed the patient information on how to schedule future transportation to physician appointments using her [employer] benefit . Advised the patient to contact her endocrinology office to reschedule the appointment  o SW to await return call from the patient to assist with transportation arrangements as needed . Scheduled outbound call to the patient over the next month to confirm receipt of resource  Patient Self Care Activities:  . Patient verbalizes understanding of plan to follow up with SW regarding transportation resources  . Self administers medications as prescribed . Performs ADL's independently . Performs IADL's independently  Initial goal documentation     . "I need to learn how to eat better" (pt-stated)       Current Barriers:  Marland Kitchen Knowledge Deficits related to how and what to eat to help improve nutrition,  hydration and weight loss  . Chronic Disease Management support and education needs related to Obesity, HTN, Pre-diabetes  Nurse Case Manager Clinical Goal(s):  Marland Kitchen Over the next 90 days, patient will meet with RN Care Manager to address disease education and support to increase knowledge and understanding of how to make healthy food choices AEB by patient will lower her BMI and promote weight loss  Interventions:  . Evaluation of current treatment plan related to Obesity and patient's adherence to plan as established by provider. . Provided education to patient re: the importance of  establishing short term realistic weight loss goals to help be successful as she implements healthier food choices for daily consumption; encouraged to keep a food journal and slowly substitute unhealthy foods for healthy foods one day or one week at a time; Provided active listening and support to patient regarding her desire to "eat healthier" . Discussed plans with patient for ongoing care management follow up and provided patient with direct contact information for care management team . Provided patient with printed educational materials related to How Should I Start: Taking Charge of Your Health and Well Being  Patient Self Care Activities:  . Self administers medications as prescribed . Attends all scheduled provider appointments . Calls pharmacy for medication refills . Performs ADL's independently . Performs IADL's independently . Calls provider office for new concerns or questions  Initial goal documentation       Other   . COMPLETED: Assist with Chronic Care Management and Care Coordination needs       Current Barriers:  Marland Kitchen Knowledge Barriers related to resources and support available to address needs related to Chronic Care Management and Community Resource needs  Case Manager Clinical Goal(s):  Marland Kitchen Over the next 30 days, patient will work with the CCM team to address needs related to disease education and support of chronic conditions, including Care Coordination and Resource needs.   Interventions:  . Collaborated with BSW and initiated plan of care to address needs related to Chronic Care Management and Care Coordination needs  Patient Self Care Activities:  . Self administers medications as prescribed . Attends all scheduled provider appointments . Calls pharmacy for medication refills . Calls provider office for new concerns or questions  Initial goal documentation       Plan:   Telephone follow up appointment with care management team member scheduled for:  02/25/20  Barb Merino, RN, BSN, CCM Care Management Coordinator [facility]  Direct Phone: [phone]

## 2020-01-23 ENCOUNTER — Other Ambulatory Visit: Payer: Medicare HMO

## 2020-01-23 ENCOUNTER — Other Ambulatory Visit: Payer: Self-pay

## 2020-01-23 DIAGNOSIS — E559 Vitamin D deficiency, unspecified: Secondary | ICD-10-CM

## 2020-01-23 DIAGNOSIS — R7989 Other specified abnormal findings of blood chemistry: Secondary | ICD-10-CM

## 2020-01-23 DIAGNOSIS — E042 Nontoxic multinodular goiter: Secondary | ICD-10-CM

## 2020-01-23 DIAGNOSIS — R7303 Prediabetes: Secondary | ICD-10-CM

## 2020-01-24 LAB — TSH: TSH: 0.509 u[IU]/mL (ref 0.450–4.500)

## 2020-01-24 LAB — HEMOGLOBIN A1C
Est. average glucose Bld gHb Est-mCnc: 123 mg/dL
Hgb A1c MFr Bld: 5.9 % — ABNORMAL HIGH (ref 4.8–5.6)

## 2020-01-24 LAB — VITAMIN D 25 HYDROXY (VIT D DEFICIENCY, FRACTURES): Vit D, 25-Hydroxy: 43.5 ng/mL (ref 30.0–100.0)

## 2020-01-24 LAB — T3, FREE: T3, Free: 3.3 pg/mL (ref 2.0–4.4)

## 2020-02-11 ENCOUNTER — Telehealth: Payer: Self-pay

## 2020-02-11 DIAGNOSIS — I471 Supraventricular tachycardia: Secondary | ICD-10-CM

## 2020-02-11 NOTE — Telephone Encounter (Signed)
[employer] calling to report monitor results.  Patient wore Zio patch 2/5-2/19. Monitor showed: 45 runs of SVT Fastest- 40 min 45 sec with a max rate of 210 bpm Longest- 1 hour 26 min with avg rate of 125 bpm  They will upload results to the site. Will forward to monitor technician to upload results for MD to review.

## 2020-02-12 ENCOUNTER — Other Ambulatory Visit: Payer: Self-pay

## 2020-02-12 ENCOUNTER — Ambulatory Visit: Payer: Self-pay

## 2020-02-12 ENCOUNTER — Ambulatory Visit
Admission: RE | Admit: 2020-02-12 | Discharge: 2020-02-12 | Disposition: A | Discharge status: Home / Self Care | Payer: Medicare HMO | Source: Ambulatory Visit | Attending: Nurse Practitioner | Admitting: Nurse Practitioner

## 2020-02-12 DIAGNOSIS — R7303 Prediabetes: Secondary | ICD-10-CM

## 2020-02-12 DIAGNOSIS — Z1231 Encounter for screening mammogram for malignant neoplasm of breast: Secondary | ICD-10-CM

## 2020-02-12 DIAGNOSIS — I10 Essential (primary) hypertension: Secondary | ICD-10-CM

## 2020-02-12 NOTE — Telephone Encounter (Signed)
The patient has been notified of the result and recommendations. Referral for EP placed. Patient verbalized understanding.  All questions (if any) were answered. Cleon Gustin, RN 02/12/2020 4:27 PM

## 2020-02-12 NOTE — Telephone Encounter (Signed)
HR is already slow at baseline.  Cannot add beta blocker at this time.  WOuld refer to EP.

## 2020-02-13 NOTE — Chronic Care Management (AMB) (Signed)
Chronic Care Management    Social Work Follow Up Note  02/12/2020 Name: Linda Mccoy MRN: [id number] DOB: 1951-02-26  Linda Mccoy is a 69 y.o. year old female who is a primary care patient of Minette Brine, [place]. The CCM team was consulted for assistance with care coordination.   Review of patient status, including review of consultants reports, other relevant assessments, and collaboration with appropriate care team members and the patient's provider was performed as part of comprehensive patient evaluation and provision of chronic care management services.    SDOH (Social Determinants of Health) assessments performed: No    Outpatient Encounter Medications as of 02/12/2020  Medication Sig  . aspirin EC 81 MG tablet Take 81 mg by mouth daily.  . Calcium-Magnesium-Zinc 333-133-5 MG TABS Take 1 tablet by mouth daily.  . Cholecalciferol (VITAMIN D) 2000 UNITS CAPS Take 2,000 Units by mouth daily.  Marland Kitchen lisinopril-hydrochlorothiazide (ZESTORETIC) 20-25 MG tablet Take 1 tablet by mouth daily.  Marland Kitchen Specialty Vitamins Products (BIOTIN PLUS KERATIN) 10000-100 MCG-MG TABS Take 1 tablet by mouth daily.   No facility-administered encounter medications on file as of 02/12/2020.     Goals Addressed            This Visit's Progress     Patient Stated   . "I am worried about water leaks in my home" (pt-stated)       Current Barriers:  . Financial constraints related to cost of home repairs . Limited knowledge of community resources to assist with repairs  Social Work Clinical Goal(s):  Marland Kitchen Over the next 60 days the patient will work with SW to become more knowledgeable of local community resources to assist with home repair needs.   CCM SW Interventions: Completed 02/12/2020 . Outbound call placed to the patient to assess progression of patient goal . Determined the patient has yet to be contacted by [employer] regarding [employer] referral . Collaboration with Sharol Given regarding status of patient referral for home repair o Determined patients referral was accepted off [facility] but the worker assigned the patients referral is no longer with the agency o Informed by Mrs. Mortimer Fries she would takeover the patients referral and plan to outreach the patient over the next month to assess for patient eligibility  Patient Self Care Activities:  . Patient verbalizes understanding of plan to work with SW to identify home repair resources . Self administers medications as prescribed . Performs ADL's independently . Performs IADL's independently  Please see past updates related to this goal by clicking on the "Past Updates" button in the selected goal      . COMPLETED: "I had to cancel an appointment to see a specialist due to transportation concerns" (pt-stated)       Current Barriers:  . Limited ability to access endocrinologist due to transportation limitations . Lacks knowledge of [employer] transportation benefit . Limited support persons available to assist with transportation needs due to work schedules  Social Work Clinical Goal(s):  Marland Kitchen Over the next 45 days the patient will work with CM SW to become more knowledgeable of transportation resources to assist with physician appointments.  CCM SW Interventions: Completed 02/12/2020 . Outbound call placed to the patient to confirm receipt of mailed [employer] transportation resource . Reviewed opportunity to access transportation services under the patients health plan benefit . Assessed for current transportation needs o Patient reports she has no acute needs as her sister is actively assisting with providing transportation . Encouraged the  patient to contact SW with future transportation resource needs . Goal Met  Patient Self Care Activities:  . Patient verbalizes understanding of plan to follow up with SW regarding transportation resources  . Self administers medications as  prescribed . Performs ADL's independently . Performs IADL's independently  Please see past updates related to this goal by clicking on the "Past Updates" button in the selected goal        Other   . COMPLETED: Assist with enrollment into care management services and collaborate with RN Case Manager regarding ongoing care coordination needs       Current Barriers:  . Limited ability to manage chronic conditions due to limited knowledge of each condition   Social Work Clinical Goal(s):  Marland Kitchen Over the next 120 days the patient will work with care management team to address care coordination needs and assist with ongoing management of chronic conditions including Hypertension and Obesity  CCM SW Interventions: Completed 02/12/2020 . Chart reviewed to note patient is active with RN Case Manager . Goal Met - RN Clinical assessment and care plan  Patient Self Care Activities:  . Patient verbalizes understanding of plan to work with CM team for ongoing care management of chronic conditions . Performs ADL's independently . Performs IADL's independently  Please see past updates related to this goal by clicking on the "Past Updates" button in the selected goal          Follow Up Plan: SW will follow up with patient by phone over the next month.   Daneen Schick, BSW, CDP Social Worker, Certified Dementia Practitioner [place] [facility] [phone]  Total time spent performing care coordination and/or care management activities with the patient by phone or face to face = 10 minutes.

## 2020-02-13 NOTE — Patient Instructions (Signed)
Social Worker Visit Information  Goals we discussed today:  Goals Addressed            This Visit's Progress     Patient Stated   . "I am worried about water leaks in my home" (pt-stated)       Current Barriers:  . Financial constraints related to cost of home repairs . Limited knowledge of community resources to assist with repairs  Social Work Clinical Goal(s):  Marland Kitchen Over the next 60 days the patient will work with SW to become more knowledgeable of local community resources to assist with home repair needs.   CCM SW Interventions: Completed 02/12/2020 . Outbound call placed to the patient to assess progression of patient goal . Determined the patient has yet to be contacted by [employer] regarding [employer] referral . Collaboration with Sharol Given regarding status of patient referral for home repair o Determined patients referral was accepted off [place] but the worker assigned the patients referral is no longer with the agency o Informed by Mrs. Mortimer Fries she would takeover the patients referral and plan to outreach the patient over the next month to assess for patient eligibility  Patient Self Care Activities:  . Patient verbalizes understanding of plan to work with SW to identify home repair resources . Self administers medications as prescribed . Performs ADL's independently . Performs IADL's independently  Please see past updates related to this goal by clicking on the "Past Updates" button in the selected goal      . COMPLETED: "I had to cancel an appointment to see a specialist due to transportation concerns" (pt-stated)       Current Barriers:  . Limited ability to access endocrinologist due to transportation limitations . Lacks knowledge of [employer] transportation benefit . Limited support persons available to assist with transportation needs due to work schedules  Social Work Clinical Goal(s):  Marland Kitchen Over the next 45 days the patient will  work with CM SW to become more knowledgeable of transportation resources to assist with physician appointments.  CCM SW Interventions: Completed 02/12/2020 . Outbound call placed to the patient to confirm receipt of mailed [employer] transportation resource . Reviewed opportunity to access transportation services under the patients health plan benefit . Assessed for current transportation needs o Patient reports she has no acute needs as her sister is actively assisting with providing transportation . Encouraged the patient to contact SW with future transportation resource needs . Goal Met  Patient Self Care Activities:  . Patient verbalizes understanding of plan to follow up with SW regarding transportation resources  . Self administers medications as prescribed . Performs ADL's independently . Performs IADL's independently  Please see past updates related to this goal by clicking on the "Past Updates" button in the selected goal        Other   . COMPLETED: Assist with enrollment into care management services and collaborate with RN Case Manager regarding ongoing care coordination needs       Current Barriers:  . Limited ability to manage chronic conditions due to limited knowledge of each condition   Social Work Clinical Goal(s):  Marland Kitchen Over the next 120 days the patient will work with care management team to address care coordination needs and assist with ongoing management of chronic conditions including Hypertension and Obesity  CCM SW Interventions: Completed 02/12/2020 . Chart reviewed to note patient is active with RN Case Manager . Goal Met - RN Clinical assessment and care plan  Patient Self  Care Activities:  . Patient verbalizes understanding of plan to work with CM team for ongoing care management of chronic conditions . Performs ADL's independently . Performs IADL's independently  Please see past updates related to this goal by clicking on the "Past Updates" button in the  selected goal          Follow Up Plan: SW will follow up with patient by phone over the next month   Daneen Schick, BSW, CDP Social Worker, Certified Dementia Practitioner [place] [facility] [phone]

## 2020-02-20 ENCOUNTER — Telehealth: Payer: Self-pay | Admitting: *Deleted

## 2020-02-20 ENCOUNTER — Telehealth: Payer: Self-pay

## 2020-02-20 MED ORDER — METOPROLOL TARTRATE 25 MG PO TABS: 25.0000 mg | ORAL_TABLET | Freq: Two times a day (BID) | ORAL | 0 refills | Status: DC

## 2020-02-20 NOTE — Telephone Encounter (Signed)
Drue Novel I, RN  02/20/2020 10:42 AM EST    The patient has been notified of the result and verbalized understanding. Made patient aware that Dr. Irish Lack would like to start her on metoprolol 25 mg BID. Rx sent in to preferred pharmacy. Patient is scheduled with Dr. Lovena Le on 02/26/20 at 11:00. All questions (if any) were answered. Cleon Gustin, RN 02/20/2020 10:40 AM    Drue Novel I, RN  02/15/2020 3:14 PM EST    Left message for patient to call back.  Confirmed with Dr. Irish Lack that we will start BB. Patient's HR low at visit in office but on monitor average was in the 80s.   Jettie Booze, MD  02/14/2020 6:42 PM EST    Given sustained SVT, she should be seen by EP. Please explain that it si a benign rhythm, not fatal but more of a nuisance. Since sx have persisted, will see if EP would consider AAD ro ablation. Start metoprolol 25 mg BID.

## 2020-02-20 NOTE — Telephone Encounter (Signed)
Received a fax from [employer].  Pt is requesting 90 day supply of the Metoprolol 25 mg bid that was sent in to them today. I will route to the RN, [place], that sent it in to make sure it's ok.

## 2020-02-20 NOTE — Telephone Encounter (Signed)
90 day supply sent in. Dr. Lovena Le will determine if patient needs to stop or continue metoprolol at her appt on 3/16.

## 2020-02-25 ENCOUNTER — Telehealth: Payer: Self-pay

## 2020-02-26 ENCOUNTER — Encounter: Payer: Self-pay | Admitting: Internal Medicine

## 2020-02-26 ENCOUNTER — Ambulatory Visit: Payer: Medicare HMO | Admitting: Internal Medicine

## 2020-02-26 ENCOUNTER — Other Ambulatory Visit: Payer: Self-pay

## 2020-02-26 DIAGNOSIS — R002 Palpitations: Secondary | ICD-10-CM | POA: Diagnosis not present

## 2020-02-26 NOTE — Progress Notes (Signed)
HPI Mrs. Cashmere Furbush is referred by Dr. Irish Lack for evaluation of SVT. The patient is a pleasant morbidly obese woman with palpitations who was found to have SVT on cardiac monitoring. She appears to have a long RP tachycardia, which is most likely atrial tachy. She has not had syncope and notes that the episodes occur much more often when she is lying down in bed. She has not had syncope. The episodes occur a couple of times a month. She has been treated with beta blocker therapy but her symptoms persist though she does not have a lot of debility from the SVT.  Allergies  Allergen Reactions  . Penicillins Hives     Current Outpatient Medications  Medication Sig Dispense Refill  . aspirin EC 81 MG tablet Take 81 mg by mouth daily.    . Calcium-Magnesium-Zinc 333-133-5 MG TABS Take 1 tablet by mouth daily.    . Cholecalciferol (VITAMIN D) 2000 UNITS CAPS Take 2,000 Units by mouth daily.    Marland Kitchen lisinopril-hydrochlorothiazide (ZESTORETIC) 20-25 MG tablet Take 1 tablet by mouth daily. 90 tablet 1  . metoprolol tartrate (LOPRESSOR) 25 MG tablet Take 1 tablet (25 mg total) by mouth 2 (two) times daily. 180 tablet 0  . Specialty Vitamins Products (BIOTIN PLUS KERATIN) 10000-100 MCG-MG TABS Take 1 tablet by mouth daily.     No current facility-administered medications for this visit.     Past Medical History:  Diagnosis Date  . Anemia    History  . Borderline diabetes    no meds  . Depression    no meds  . Endometrial polyp 05/30/2014  . Fibroids   . Goiter   . History of blood transfusion    approx 20 yrs [facility] at [facility]  . Hypertension   . Multiple thyroid nodules 11/13/2018  . Obesity, unspecified 05/30/2014  . Postmenopausal bleeding 05/30/2014  . Prediabetes 02/12/2019  . Sleep apnea    Does not use CPAP  . SVD (spontaneous vaginal delivery)    x 3  . Unspecified sleep apnea 05/30/2014  . Vitamin D deficiency 11/29/2018    ROS:   All systems reviewed and negative except  as noted in the HPI.   Past Surgical History:  Procedure Laterality Date  . COLONOSCOPY    . DILATATION & CURRETTAGE/HYSTEROSCOPY WITH RESECTOCOPE N/A 05/30/2014   Procedure: DILATATION & CURETTAGE/HYSTEROSCOPY WITH RESECTion of endometrial polyp;  Surgeon: Ena Dawley, MD;  Location: [facility];  Service: Gynecology;  Laterality: N/A;  . TUBAL LIGATION       Family History  Problem Relation Age of Onset  . Diabetes Mother   . Hypertension Mother   . Alcohol abuse Mother   . Breast cancer Neg Hx      Social History   Socioeconomic History  . Marital status: Widowed    Spouse name: Not on file  . Number of children: Not on file  . Years of education: Not on file  . Highest education level: Not on file  Occupational History  . Occupation: retired  Tobacco Use  . Smoking status: Never Smoker  . Smokeless tobacco: Never Used  Substance and Sexual Activity  . Alcohol use: Not Currently  . Drug use: No  . Sexual activity: Not Currently    Partners: Male    Birth control/protection: Post-menopausal, Surgical    Comment: BTL   Other Topics Concern  . Not on file  Social History Narrative  . Not on file   Social  Determinants of Health   Financial Resource Strain: High Risk  . Difficulty of Paying Living Expenses: Hard  Food Insecurity: Food Insecurity Present  . Worried About Charity fundraiser in the Last Year: Sometimes true  . Ran Out of Food in the Last Year: Sometimes true  Transportation Needs: Unmet Transportation Needs  . Lack of Transportation (Medical): Yes  . Lack of Transportation (Non-Medical): Yes  Physical Activity: Insufficiently Active  . Days of Exercise per Week: 3 days  . Minutes of Exercise per Session: 40 min  Stress: No Stress Concern Present  . Feeling of Stress : Not at all  Social Connections:   . Frequency of Communication with Friends and Family:   . Frequency of Social Gatherings with Friends and Family:   . Attends Religious  Services:   . Active Member of Clubs or Organizations:   . Attends Archivist Meetings:   Marland Kitchen Marital Status:   Intimate Partner Violence: Not At Risk  . Fear of Current or Ex-Partner: No  . Emotionally Abused: No  . Physically Abused: No  . Sexually Abused: No     BP 122/78   Pulse 82   Ht 5\' 4"  (1.626 m)   Wt 247 lb (112 kg)   LMP 12/26/2010   SpO2 98%   BMI 42.40 kg/m   Physical Exam:  Well appearing NAD HEENT: Unremarkable Neck:  No JVD, no thyromegally Lymphatics:  No adenopathy Back:  No CVA tenderness Lungs:  Clear HEART:  Regular rate rhythm, no murmurs, no rubs, no clicks Abd:  soft, positive bowel sounds, no organomegally, no rebound, no guarding Ext:  2 plus pulses, no edema, no cyanosis, no clubbing Skin:  No rashes no nodules Neuro:  CN II through XII intact, motor grossly intact  EKG - nsr with no pre-excitation  Assess/Plan: 1.SVT - I have discussed the treatment options and have explained that the episodes are not life threatening. She will continue her beta blocker for now. She will call us if she has worsening and would like to try a different med or consider catheter ablation which we discussed.  2. Obesity - she is encouraged to lose weight.  3. HTN - her SBP is reasonably well controlled. We will follow.  Cristopher Peru, MD

## 2020-02-26 NOTE — Patient Instructions (Addendum)
Medication Instructions:  Your physician recommends that you continue on your current medications as directed. Please refer to the Current Medication list given to you today.  Labwork: None ordered.  Testing/Procedures: None ordered.  Follow-Up: Your physician wants you to follow-up in: as needed with Dr. Taylor.      Any Other Special Instructions Will Be Listed Below (If Applicable).  If you need a refill on your cardiac medications before your next appointment, please call your pharmacy.   

## 2020-03-18 ENCOUNTER — Ambulatory Visit: Payer: Self-pay

## 2020-03-18 DIAGNOSIS — I10 Essential (primary) hypertension: Secondary | ICD-10-CM

## 2020-03-18 NOTE — Patient Instructions (Signed)
Social Worker Visit Information  Goals we discussed today:  Goals Addressed            This Visit's Progress     Patient Stated   . COMPLETED: "I am worried about water leaks in my home" (pt-stated)       Current Barriers:  . Financial constraints related to cost of home repairs . Limited knowledge of community resources to assist with repairs  Social Work Clinical Goal(s):  Marland Kitchen Over the next 60 days the patient will work with SW to become more knowledgeable of local community resources to assist with home repair needs.   CCM SW Interventions: Completed 03/18/2020 . Outbound call placed to the patient to assess progression of patient goal . Determined the patient has been in contact with [employer] but has chosen not to pursue resource assistance at this time due to the patients daughter being unable to provide needed documents to proceed with home repairs . Encouraged the patient to remain in contact with [employer] should she change her mind . Advised the patient to contact SW with future resource needs  Patient Self Care Activities:  . Patient verbalizes understanding of plan to work with SW to identify home repair resources . Self administers medications as prescribed . Performs ADL's independently . Performs IADL's independently  Please see past updates related to this goal by clicking on the "Past Updates" button in the selected goal          Follow Up Plan: No SW follow up planned at this time. Please contact me with future resource needs.   Daneen Schick, BSW, CDP Social Worker, Certified Dementia Practitioner [place] [facility] [place] [facility] [phone]

## 2020-03-18 NOTE — Chronic Care Management (AMB) (Signed)
  Chronic Care Management    Social Work Follow Up Note  03/18/2020 Name: Linda Mccoy MRN: [id number] DOB: 1951-03-16  Linda Mccoy is a 69 y.o. year old female who is a primary care patient of Minette Brine, [place]. The CCM team was consulted for assistance with care coordination.   Review of patient status, including review of consultants reports, other relevant assessments, and collaboration with appropriate care team members and the patient's provider was performed as part of comprehensive patient evaluation and provision of chronic care management services.    SDOH (Social Determinants of Health) assessments performed: No    Outpatient Encounter Medications as of 03/18/2020  Medication Sig  . aspirin EC 81 MG tablet Take 81 mg by mouth daily.  . Calcium-Magnesium-Zinc 333-133-5 MG TABS Take 1 tablet by mouth daily.  . Cholecalciferol (VITAMIN D) 2000 UNITS CAPS Take 2,000 Units by mouth daily.  Marland Kitchen lisinopril-hydrochlorothiazide (ZESTORETIC) 20-25 MG tablet Take 1 tablet by mouth daily.  . metoprolol tartrate (LOPRESSOR) 25 MG tablet Take 1 tablet (25 mg total) by mouth 2 (two) times daily.  Marland Kitchen Specialty Vitamins Products (BIOTIN PLUS KERATIN) 10000-100 MCG-MG TABS Take 1 tablet by mouth daily.   No facility-administered encounter medications on file as of 03/18/2020.     Goals Addressed            This Visit's Progress     Patient Stated   . COMPLETED: "I am worried about water leaks in my home" (pt-stated)       Current Barriers:  . Financial constraints related to cost of home repairs . Limited knowledge of community resources to assist with repairs  Social Work Clinical Goal(s):  Marland Kitchen Over the next 60 days the patient will work with SW to become more knowledgeable of local community resources to assist with home repair needs.   CCM SW Interventions: Completed 03/18/2020 . Outbound call placed to the patient to assess progression of patient goal . Determined the  patient has been in contact with [employer] but has chosen not to pursue resource assistance at this time due to the patients daughter being unable to provide needed documents to proceed with home repairs . Encouraged the patient to remain in contact with [employer] should she change her mind . Advised the patient to contact SW with future resource needs  Patient Self Care Activities:  . Patient verbalizes understanding of plan to work with SW to identify home repair resources . Self administers medications as prescribed . Performs ADL's independently . Performs IADL's independently  Please see past updates related to this goal by clicking on the "Past Updates" button in the selected goal          Follow Up Plan: No SW follow up planned at this time. The patient will remain active with RN Care Manager to address chronic care management needs.   Daneen Schick, BSW, CDP Social Worker, Certified Dementia Practitioner [place] [facility] [place] [facility] [phone]  Total time spent performing care coordination and/or care management activities with the patient by phone or face to face = 7 minutes.

## 2020-05-07 ENCOUNTER — Other Ambulatory Visit: Payer: Self-pay | Admitting: Nurse Practitioner

## 2020-05-07 DIAGNOSIS — I10 Essential (primary) hypertension: Secondary | ICD-10-CM

## 2020-05-09 ENCOUNTER — Telehealth: Payer: Self-pay

## 2020-05-19 ENCOUNTER — Ambulatory Visit (INDEPENDENT_AMBULATORY_CARE_PROVIDER_SITE_OTHER): Payer: Medicare HMO

## 2020-05-19 DIAGNOSIS — R9431 Abnormal electrocardiogram [ECG] [EKG]: Secondary | ICD-10-CM | POA: Diagnosis not present

## 2020-05-19 DIAGNOSIS — E669 Obesity, unspecified: Secondary | ICD-10-CM

## 2020-05-19 DIAGNOSIS — R002 Palpitations: Secondary | ICD-10-CM

## 2020-05-19 DIAGNOSIS — I10 Essential (primary) hypertension: Secondary | ICD-10-CM | POA: Diagnosis not present

## 2020-05-19 DIAGNOSIS — R7303 Prediabetes: Secondary | ICD-10-CM | POA: Diagnosis not present

## 2020-05-19 DIAGNOSIS — R7989 Other specified abnormal findings of blood chemistry: Secondary | ICD-10-CM

## 2020-05-19 NOTE — Patient Instructions (Signed)
Social Worker Visit Information  Goals we discussed today:  Goals Addressed            This Visit's Progress     Patient Stated   . "I am having palpitations and having to wear a heart monitor" (pt-stated)   On track    Current Barriers:  Marland Kitchen Knowledge Deficits related to diagnosis and treatment for abnormal EKG . Chronic Disease Management support and education needs related to Abnormal EKG, HTN, Obesity, Prediabetes  Nurse Case Manager Clinical Goal(s):  Marland Kitchen Over the next 90 days, patient will work with her health care team to address needs related to disease education and support for diagnosis/treatment plan for Abnormal EKG  . Over the next 30 days, patient will have completed her ordered blood work including her TSH level at PCP office as prescribed by PCP Goal Met 01/23/20  CCM SW Interventions: Completed 05/19/20 . Performed chart review to note patient seen by PCP 01/23/20 to complete ordered labs o 3/16- Cardiology visit Dx of SVT- non life threatening. Patient instructed to continue beta blocker and call is symptoms worsening or if patient would like to try alternative therapy . Successful outbound call placed to the patient who reports beta blocker continues to be effective . No acute concerns surrounding heart palpitations . Advised the patient to contact PCP and/or cardiologist with future concerns . Collaboration with RN Care Manager regarding goal progression . Scheduled follow up call with RN Care Manager over the next 30 days  Interventions:  . Determined patient f/u with Cardiologist, Dr. Irish Lack to further evaluate her abnormal EKG and c/o increased heart palpitations and is prescribed to wear a Zio heart monitor x 14 days in which she is doing   . Evaluation of current treatment plan related to HTN and Abnormal EKG and patient's adherence to plan as established by provider. . Provided education to patient re: importance of medication, diet and exercise adherence for most  effectiveness for BP control; education provided related to the link between thyroid disease and hypertension, and abnormal thyroid function that may lead to heart palpitations; provided education related to target BP <130/80; Determined patient is currently at target BP  . Reviewed medications with patient and discussed frequency, dosage and indication for antihypertensive medication; patient denies having financial hardship or AE . Discussed plans with patient for ongoing care management follow up and provided patient with direct contact information for care management team . Advised patient, providing education and rationale, to monitor blood pressure daily and record, calling the CCM team and or PCP for findings outside established parameters.  . Provided patient with printed educational materials related to Life's Simple 7, Is there a link between thyroid disease hypertension?  Patient Self Care Activities:  . Self administers medications as prescribed . Attends all scheduled provider appointments . Calls pharmacy for medication refills . Performs ADL's independently . Performs IADL's independently . Calls provider office for new concerns or questions  Please see past updates related to this goal by clicking on the "Past Updates" button in the selected goal      . "I don't know if I have thyroid disease" (pt-stated)   On track    Current Barriers:  Marland Kitchen Knowledge Deficits related to evaluation and treatment of thyroid disease . Chronic Disease Management support and education needs related to Abnormal TSH, Multi-thyroid nodules, HTN, Obesity, Prediabetes  Nurse Case Manager Clinical Goal(s):  Marland Kitchen Over the next 90 days, patient will work with CCM RN CM and  PCP to address needs related to disease management, education and support to help increase knowledge and understanding of thyroid disease and Self Health management of this condition  . Over the next 30 days, patient will have completed her  ordered blood work including her TSH level at PCP office as prescribed by PCP  Goal Met 01/23/20  CCM SW Interventions: Completed 05/19/20 . Performed chart review to note recent TSH level checked on 01/23/20 WNL . Successful outbound call placed to the patient o No acute concerns at this time . Collaboration with RN Care Manager o Scheduled follow up call over the next 30 days  Interventions:  . Determined patient has incomplete labs including a TSH following her OV with PCP provider Minette Brine, FNP completed on 11/28/19 . Determined patient is unsure if she has thyroid disease and is receiving no treatment at this time for this condition and did not f/u with the ENT specialist referred by PCP provider Minette Brine, FNP following her last OV . Discussed patient previously had a transportation barrier but this is no longer an issue; Instructed patient to call the [place] office to schedule a lab visit in order to complete her labs ordered in December so that her PCP can determine if further evaluation and treatment is needed, patient agreed to go in this week to complete this blood work  . Evaluation of current treatment plan related to Abnormal TSH, Multi-thyroid nodules and patient's adherence to plan as established by provider. . Advised patient to of past referral by PCP for ENT to evaluate thyroid nodules; determined patient did not f/u with this specialist . Provided education to patient re: past abnormal TSH suggesting she has an overactive thyroid; Reviewed past imaging to confirm multi-thyroid nodules; educated patient on basic disease process of hyperthyroidism, potential cause and treatment  . Reviewed medications with patient and discussed patient is not prescribed to take thyroid medication at this time . Collaborated with PCP provider Minette Brine, FNP regarding patient's incomplete labs previously ordered including a TSH; advised patient will be calling the office to schedule a lab  visit . Discussed plans with patient for ongoing care management follow up and provided patient with direct contact information for care management team . Provided patient with printed  educational materials related to Is Your Thyroid Disease Causing Your High Blood Pressure?; What Women Need to Know About Thyroid Disease  Patient Self Care Activities:  . Patient verbalizes understanding of plan to call the PCP office to schedule a lab visit for blood work to evaluate her TSH level . Self administers medications as prescribed . Attends all scheduled provider appointments . Calls pharmacy for medication refills . Performs ADL's independently . Performs IADL's independently . Calls provider office for new concerns or questions  Please see past updates related to this goal by clicking on the "Past Updates" button in the selected goal      . "I don't now if I have diabetes" (pt-stated)   On track    Current Barriers:  Marland Kitchen Knowledge Deficits related to disease process and Self Health Management of Pre-diabetes . Chronic Disease Management support and education needs related to Pre-diabetes, HTN, Obesity,   Nurse Case Manager Clinical Goal(s):  Marland Kitchen Over the next 90 days, patient will work with the CCM team and PCP to address needs related to disease education and support for improved Self Health management of Diabetes . Over the next 30 days, patient will schedule a lab visit with her PCP office  to complete ordered blood work for evaluation of Pre-diabetes Completed 01/23/20  CCM SW Interventions: Completed 05/19/20 . Performed chart review to note most recent A1C reading of 5.9 drawn on 01/23/20 . Successful outbound call placed to the patient to assess for acute care management needs- none identified at this time . Advised the patient RN Care Manager will contact her over the next 30 days  o Encouraged the patient to contact care management team sooner if needed . Collaboration with RN Care  Manager regarding goal progression  Interventions:  . Evaluation of current treatment plan related to Pre-diabetes and patient's adherence to plan as established by provider. . Provided education to patient re: past A1C indicated pre-diabetes; Determined patient did not complete ordered blood work to re-evaluate her A1C following her 11/28/19 PCP OV; Encouraged patient to get this done ASAP so her PCP and the CCM RN CM can determine if further evaluation/education is needed to help support and better manage this disease process, pt is agreeable; Determined patient was having transportation issues in the past but this has been resolved . Discussed plans with patient for ongoing care management follow up and provided patient with direct contact information for care management team . Provided patient with printed educational materials related to Diabetes Management Using the Plate, Carb Counting, Carb Choices   Patient Self Care Activities:  . Patient verbalizes understanding of plan to contact the PCP office to schedule a lab visit to complete ordered blood work . Self administers medications as prescribed . Attends all scheduled provider appointments . Calls pharmacy for medication refills . Performs ADL's independently . Performs IADL's independently . Calls provider office for new concerns or questions  Please see past updates related to this goal by clicking on the "Past Updates" button in the selected goal          Follow Up Plan: No SW follow up planned at this time. Please contact me as needed.   Daneen Schick, BSW, CDP Social Worker, Certified Dementia Practitioner [place] [facility] [phone]

## 2020-05-19 NOTE — Chronic Care Management (AMB) (Signed)
Chronic Care Management    Social Work Follow Up Note  05/19/2020 Name: Linda Mccoy MRN: [id number] DOB: [birth year]  Linda Mccoy is a 69 y.o. year old female who is a primary care patient of Minette Brine, [place]. The CCM team was consulted for assistance with care management and care coordination.   Review of patient status, including review of consultants reports, other relevant assessments, and collaboration with appropriate care team members and the patient's provider was performed as part of comprehensive patient evaluation and provision of chronic care management services.    SDOH (Social Determinants of Health) assessments performed: No.    Outpatient Encounter Medications as of 05/19/2020  Medication Sig  . aspirin EC 81 MG tablet Take 81 mg by mouth daily.  . Calcium-Magnesium-Zinc 333-133-5 MG TABS Take 1 tablet by mouth daily.  . Cholecalciferol (VITAMIN D) 2000 UNITS CAPS Take 2,000 Units by mouth daily.  Marland Kitchen lisinopril-hydrochlorothiazide (ZESTORETIC) 20-25 MG tablet TAKE 1 TABLET EVERY DAY  . metoprolol tartrate (LOPRESSOR) 25 MG tablet Take 1 tablet (25 mg total) by mouth 2 (two) times daily.  Marland Kitchen Specialty Vitamins Products (BIOTIN PLUS KERATIN) 10000-100 MCG-MG TABS Take 1 tablet by mouth daily.   No facility-administered encounter medications on file as of 05/19/2020.     Goals Addressed            This Visit's Progress     Patient Stated   . "I am having palpitations and having to wear a heart monitor" (pt-stated)   On track    Current Barriers:  Marland Kitchen Knowledge Deficits related to diagnosis and treatment for abnormal EKG . Chronic Disease Management support and education needs related to Abnormal EKG, HTN, Obesity, Prediabetes  Nurse Case Manager Clinical Goal(s):  Marland Kitchen Over the next 90 days, patient will work with her health care team to address needs related to disease education and support for diagnosis/treatment plan for Abnormal EKG  . Over the next 30  days, patient will have completed her ordered blood work including her TSH level at PCP office as prescribed by PCP Goal Met 01/23/20  CCM SW Interventions: Completed 05/19/20 . Performed chart review to note patient seen by PCP 01/23/20 to complete ordered labs o 3/16- Cardiology visit Dx of SVT- non life threatening. Patient instructed to continue beta blocker and call is symptoms worsening or if patient would like to try alternative therapy . Successful outbound call placed to the patient who reports beta blocker continues to be effective . No acute concerns surrounding heart palpitations . Advised the patient to contact PCP and/or cardiologist with future concerns . Collaboration with RN Care Manager regarding goal progression . Scheduled follow up call with RN Care Manager over the next 30 days  Interventions:  . Determined patient f/u with Cardiologist, Dr. Irish Lack to further evaluate her abnormal EKG and c/o increased heart palpitations and is prescribed to wear a Zio heart monitor x 14 days in which she is doing   . Evaluation of current treatment plan related to HTN and Abnormal EKG and patient's adherence to plan as established by provider. . Provided education to patient re: importance of medication, diet and exercise adherence for most effectiveness for BP control; education provided related to the link between thyroid disease and hypertension, and abnormal thyroid function that may lead to heart palpitations; provided education related to target BP <130/80; Determined patient is currently at target BP  . Reviewed medications with patient and discussed frequency, dosage and indication for antihypertensive medication;  patient denies having financial hardship or AE . Discussed plans with patient for ongoing care management follow up and provided patient with direct contact information for care management team . Advised patient, providing education and rationale, to monitor blood pressure  daily and record, calling the CCM team and or PCP for findings outside established parameters.  . Provided patient with printed educational materials related to Life's Simple 7, Is there a link between thyroid disease hypertension?  Patient Self Care Activities:  . Self administers medications as prescribed . Attends all scheduled provider appointments . Calls pharmacy for medication refills . Performs ADL's independently . Performs IADL's independently . Calls provider office for new concerns or questions  Please see past updates related to this goal by clicking on the "Past Updates" button in the selected goal      . "I don't know if I have thyroid disease" (pt-stated)   On track    Current Barriers:  Marland Kitchen Knowledge Deficits related to evaluation and treatment of thyroid disease . Chronic Disease Management support and education needs related to Abnormal TSH, Multi-thyroid nodules, HTN, Obesity, Prediabetes  Nurse Case Manager Clinical Goal(s):  Marland Kitchen Over the next 90 days, patient will work with CCM RN CM and PCP to address needs related to disease management, education and support to help increase knowledge and understanding of thyroid disease and Self Health management of this condition  . Over the next 30 days, patient will have completed her ordered blood work including her TSH level at PCP office as prescribed by PCP  Goal Met 01/23/20  CCM SW Interventions: Completed 05/19/20 . Performed chart review to note recent TSH level checked on 01/23/20 WNL . Successful outbound call placed to the patient o No acute concerns at this time . Collaboration with RN Care Manager o Scheduled follow up call over the next 30 days  Interventions:  . Determined patient has incomplete labs including a TSH following her OV with PCP provider Minette Brine, FNP completed on 11/28/19 . Determined patient is unsure if she has thyroid disease and is receiving no treatment at this time for this condition and did  not f/u with the ENT specialist referred by PCP provider Minette Brine, FNP following her last OV . Discussed patient previously had a transportation barrier but this is no longer an issue; Instructed patient to call the [place] office to schedule a lab visit in order to complete her labs ordered in December so that her PCP can determine if further evaluation and treatment is needed, patient agreed to go in this week to complete this blood work  . Evaluation of current treatment plan related to Abnormal TSH, Multi-thyroid nodules and patient's adherence to plan as established by provider. . Advised patient to of past referral by PCP for ENT to evaluate thyroid nodules; determined patient did not f/u with this specialist . Provided education to patient re: past abnormal TSH suggesting she has an overactive thyroid; Reviewed past imaging to confirm multi-thyroid nodules; educated patient on basic disease process of hyperthyroidism, potential cause and treatment  . Reviewed medications with patient and discussed patient is not prescribed to take thyroid medication at this time . Collaborated with PCP provider Minette Brine, FNP regarding patient's incomplete labs previously ordered including a TSH; advised patient will be calling the office to schedule a lab visit . Discussed plans with patient for ongoing care management follow up and provided patient with direct contact information for care management team . Provided patient with printed  educational materials related to Is Your Thyroid Disease Causing Your High Blood Pressure?; What Women Need to Know About Thyroid Disease  Patient Self Care Activities:  . Patient verbalizes understanding of plan to call the PCP office to schedule a lab visit for blood work to evaluate her TSH level . Self administers medications as prescribed . Attends all scheduled provider appointments . Calls pharmacy for medication refills . Performs ADL's independently . Performs  IADL's independently . Calls provider office for new concerns or questions  Please see past updates related to this goal by clicking on the "Past Updates" button in the selected goal      . "I don't now if I have diabetes" (pt-stated)   On track    Current Barriers:  Marland Kitchen Knowledge Deficits related to disease process and Self Health Management of Pre-diabetes . Chronic Disease Management support and education needs related to Pre-diabetes, HTN, Obesity,   Nurse Case Manager Clinical Goal(s):  Marland Kitchen Over the next 90 days, patient will work with the CCM team and PCP to address needs related to disease education and support for improved Self Health management of Diabetes . Over the next 30 days, patient will schedule a lab visit with her PCP office to complete ordered blood work for evaluation of Pre-diabetes Completed 01/23/20  CCM SW Interventions: Completed 05/19/20 . Performed chart review to note most recent A1C reading of 5.9 drawn on 01/23/20 . Successful outbound call placed to the patient to assess for acute care management needs- none identified at this time . Advised the patient RN Care Manager will contact her over the next 30 days  o Encouraged the patient to contact care management team sooner if needed . Collaboration with RN Care Manager regarding goal progression  Interventions:  . Evaluation of current treatment plan related to Pre-diabetes and patient's adherence to plan as established by provider. . Provided education to patient re: past A1C indicated pre-diabetes; Determined patient did not complete ordered blood work to re-evaluate her A1C following her 11/28/19 PCP OV; Encouraged patient to get this done ASAP so her PCP and the CCM RN CM can determine if further evaluation/education is needed to help support and better manage this disease process, pt is agreeable; Determined patient was having transportation issues in the past but this has been resolved . Discussed plans with  patient for ongoing care management follow up and provided patient with direct contact information for care management team . Provided patient with printed educational materials related to Diabetes Management Using the Plate, Carb Counting, Carb Choices   Patient Self Care Activities:  . Patient verbalizes understanding of plan to contact the PCP office to schedule a lab visit to complete ordered blood work . Self administers medications as prescribed . Attends all scheduled provider appointments . Calls pharmacy for medication refills . Performs ADL's independently . Performs IADL's independently . Calls provider office for new concerns or questions  Please see past updates related to this goal by clicking on the "Past Updates" button in the selected goal          Follow Up Plan: Scheduled telephonic outreach with [facility] over the next 30 days.   Daneen Schick, BSW, CDP Social Worker, Certified Dementia Practitioner [place] [facility] [phone]  Total time spent performing care coordination and/or care management activities with the patient by phone or face to face = 25 minutes.

## 2020-05-26 ENCOUNTER — Other Ambulatory Visit: Payer: Self-pay | Admitting: *Deleted

## 2020-05-26 MED ORDER — METOPROLOL TARTRATE 25 MG PO TABS: 25.0000 mg | ORAL_TABLET | Freq: Two times a day (BID) | ORAL | 2 refills | Status: DC

## 2020-05-28 ENCOUNTER — Ambulatory Visit: Payer: Medicare HMO | Admitting: Nurse Practitioner

## 2020-06-04 ENCOUNTER — Other Ambulatory Visit: Payer: Self-pay

## 2020-06-04 ENCOUNTER — Encounter: Payer: Self-pay | Admitting: Nurse Practitioner

## 2020-06-04 ENCOUNTER — Ambulatory Visit (INDEPENDENT_AMBULATORY_CARE_PROVIDER_SITE_OTHER): Payer: Medicare HMO | Admitting: Nurse Practitioner

## 2020-06-04 ENCOUNTER — Ambulatory Visit (INDEPENDENT_AMBULATORY_CARE_PROVIDER_SITE_OTHER): Payer: Medicare HMO

## 2020-06-04 VITALS — BP 124/80 | HR 86 | Temp 97.9°F | Ht 65.6 in | Wt 253.4 lb

## 2020-06-04 VITALS — BP 124/80 | HR 86 | Temp 97.9°F | Ht 65.6 in | Wt 253.0 lb

## 2020-06-04 DIAGNOSIS — Z0000 Encounter for general adult medical examination without abnormal findings: Secondary | ICD-10-CM | POA: Diagnosis not present

## 2020-06-04 DIAGNOSIS — Z139 Encounter for screening, unspecified: Secondary | ICD-10-CM

## 2020-06-04 DIAGNOSIS — R7303 Prediabetes: Secondary | ICD-10-CM

## 2020-06-04 DIAGNOSIS — E559 Vitamin D deficiency, unspecified: Secondary | ICD-10-CM | POA: Diagnosis not present

## 2020-06-04 DIAGNOSIS — I10 Essential (primary) hypertension: Secondary | ICD-10-CM | POA: Diagnosis not present

## 2020-06-04 DIAGNOSIS — R413 Other amnesia: Secondary | ICD-10-CM

## 2020-06-04 NOTE — Patient Instructions (Signed)
Ms. Majid , Thank you for taking time to come for your Medicare Wellness Visit. I appreciate your ongoing commitment to your health goals. Please review the following plan we discussed and let me know if I can assist you in the future.   Screening recommendations/referrals: Colonoscopy: completed 08/31/2019, due 09/01/2026 Mammogram: completed 02/12/2020, due 02/11/2021 Bone Density: completed 12/08/2017 Recommended yearly ophthalmology/optometry visit for glaucoma screening and checkup Recommended yearly dental visit for hygiene and checkup  Vaccinations: Influenza vaccine: completed 09/14/2019 Pneumococcal vaccine: declined Tdap vaccine: completed 09/08/2010, due 09/08/2020 Shingles vaccine: discussed   Covid-19:02/19/2020, 01/25/2020  Advanced directives: Advance directive discussed with you today. Even though you declined this today please call our office should you change your mind and we can give you the proper paperwork for you to fill out.   Conditions/risks identified: obesity  Next appointment: 12/03/2020 at 9:00  Follow up in one year for your annual wellness visit    Preventive Care 87 Years and Older, Female Preventive care refers to lifestyle choices and visits with your health care provider that can promote health and wellness. What does preventive care include?  A yearly physical exam. This is also called an annual well check.  Dental exams once or twice a year.  Routine eye exams. Ask your health care provider how often you should have your eyes checked.  Personal lifestyle choices, including:  Daily care of your teeth and gums.  Regular physical activity.  Eating a healthy diet.  Avoiding tobacco and drug use.  Limiting alcohol use.  Practicing safe sex.  Taking low-dose aspirin every day.  Taking vitamin and mineral supplements as recommended by your health care provider. What happens during an annual well check? The services and screenings done by your  health care provider during your annual well check will depend on your age, overall health, lifestyle risk factors, and family history of disease. Counseling  Your health care provider may ask you questions about your:  Alcohol use.  Tobacco use.  Drug use.  Emotional well-being.  Home and relationship well-being.  Sexual activity.  Eating habits.  History of falls.  Memory and ability to understand (cognition).  Work and work Statistician.  Reproductive health. Screening  You may have the following tests or measurements:  Height, weight, and BMI.  Blood pressure.  Lipid and cholesterol levels. These may be checked every 5 years, or more frequently if you are over 38 years old.  Skin check.  Lung cancer screening. You may have this screening every year starting at age 64 if you have a 30-pack-year history of smoking and currently smoke or have quit within the past 15 years.  Fecal occult blood test (FOBT) of the stool. You may have this test every year starting at age 4.  Flexible sigmoidoscopy or colonoscopy. You may have a sigmoidoscopy every 5 years or a colonoscopy every 10 years starting at age 34.  Hepatitis C blood test.  Hepatitis B blood test.  Sexually transmitted disease (STD) testing.  Diabetes screening. This is done by checking your blood sugar (glucose) after you have not eaten for a while (fasting). You may have this done every 1-3 years.  Bone density scan. This is done to screen for osteoporosis. You may have this done starting at age 28.  Mammogram. This may be done every 1-2 years. Talk to your health care provider about how often you should have regular mammograms. Talk with your health care provider about your test results, treatment options, and if  necessary, the need for more tests. Vaccines  Your health care provider may recommend certain vaccines, such as:  Influenza vaccine. This is recommended every year.  Tetanus, diphtheria, and  acellular pertussis (Tdap, Td) vaccine. You may need a Td booster every 10 years.  Zoster vaccine. You may need this after age 76.  Pneumococcal 13-valent conjugate (PCV13) vaccine. One dose is recommended after age 71.  Pneumococcal polysaccharide (PPSV23) vaccine. One dose is recommended after age 75. Talk to your health care provider about which screenings and vaccines you need and how often you need them. This information is not intended to replace advice given to you by your health care provider. Make sure you discuss any questions you have with your health care provider. Document Released: 12/26/2015 Document Revised: 08/18/2016 Document Reviewed: 09/30/2015 Elsevier Interactive Patient Education  2017 [place] Prevention in the Home Falls can cause injuries. They can happen to people of all ages. There are many things you can do to make your home safe and to help prevent falls. What can I do on the outside of my home?  Regularly fix the edges of walkways and driveways and fix any cracks.  Remove anything that might make you trip as you walk through a door, such as a raised step or threshold.  Trim any bushes or trees on the path to your home.  Use bright outdoor lighting.  Clear any walking paths of anything that might make someone trip, such as rocks or tools.  Regularly check to see if handrails are loose or broken. Make sure that both sides of any steps have handrails.  Any raised decks and porches should have guardrails on the edges.  Have any leaves, snow, or ice cleared regularly.  Use sand or salt on walking paths during winter.  Clean up any spills in your garage right away. This includes oil or grease spills. What can I do in the bathroom?  Use night lights.  Install grab bars by the toilet and in the tub and shower. Do not use towel bars as grab bars.  Use non-skid mats or decals in the tub or shower.  If you need to sit down in the shower, use  a plastic, non-slip stool.  Keep the floor dry. Clean up any water that spills on the floor as soon as it happens.  Remove soap buildup in the tub or shower regularly.  Attach bath mats securely with double-sided non-slip rug tape.  Do not have throw rugs and other things on the floor that can make you trip. What can I do in the bedroom?  Use night lights.  Make sure that you have a light by your bed that is easy to reach.  Do not use any sheets or blankets that are too big for your bed. They should not hang down onto the floor.  Have a firm chair that has side arms. You can use this for support while you get dressed.  Do not have throw rugs and other things on the floor that can make you trip. What can I do in the kitchen?  Clean up any spills right away.  Avoid walking on wet floors.  Keep items that you use a lot in easy-to-reach places.  If you need to reach something above you, use a strong step stool that has a grab bar.  Keep electrical cords out of the way.  Do not use floor polish or wax that makes floors slippery. If you must  use wax, use non-skid floor wax.  Do not have throw rugs and other things on the floor that can make you trip. What can I do with my stairs?  Do not leave any items on the stairs.  Make sure that there are handrails on both sides of the stairs and use them. Fix handrails that are broken or loose. Make sure that handrails are as long as the stairways.  Check any carpeting to make sure that it is firmly attached to the stairs. Fix any carpet that is loose or worn.  Avoid having throw rugs at the top or bottom of the stairs. If you do have throw rugs, attach them to the floor with carpet tape.  Make sure that you have a light switch at the top of the stairs and the bottom of the stairs. If you do not have them, ask someone to add them for you. What else can I do to help prevent falls?  Wear shoes that:  Do not have high heels.  Have  rubber bottoms.  Are comfortable and fit you well.  Are closed at the toe. Do not wear sandals.  If you use a stepladder:  Make sure that it is fully opened. Do not climb a closed stepladder.  Make sure that both sides of the stepladder are locked into place.  Ask someone to hold it for you, if possible.  Clearly mark and make sure that you can see:  Any grab bars or handrails.  First and last steps.  Where the edge of each step is.  Use tools that help you move around (mobility aids) if they are needed. These include:  Canes.  Walkers.  Scooters.  Crutches.  Turn on the lights when you go into a dark area. Replace any light bulbs as soon as they burn out.  Set up your furniture so you have a clear path. Avoid moving your furniture around.  If any of your floors are uneven, fix them.  If there are any pets around you, be aware of where they are.  Review your medicines with your doctor. Some medicines can make you feel dizzy. This can increase your chance of falling. Ask your doctor what other things that you can do to help prevent falls. This information is not intended to replace advice given to you by your health care provider. Make sure you discuss any questions you have with your health care provider. Document Released: 09/25/2009 Document Revised: 05/06/2016 Document Reviewed: 01/03/2015 Elsevier Interactive Patient Education  2017 [employer].

## 2020-06-04 NOTE — Progress Notes (Signed)
Subjective:     Patient ID: Linda Mccoy , female    DOB: [birth year] , 69 y.o.   MRN: [id number]   Chief Complaint  Patient presents with  . Hypertension    HPI  Linda Mccoy presents today for a follow up for her HTN. She is doing well and her average readings are 120/80. She walks everyday with her granddaughter. She watches her sodium but does report that she eats breads and sweets. She is aware of the need to practice moderation with her sweets and bread intake. We discussed her pre diabetic status and  risk and the need to be aware of reducing her carbohydrate intake. She went to her cardiologist per her referral and she was started on lopressor that she reports is helping with her SVT. She has not gotten a refill and I advised that she should follow up with the cardiologist about the need to continue the medication.   She wants more information on the shingle vaccination.   She made me aware she was raised in an orphanage.  Hypertension This is a chronic problem. The current episode started more than 1 year ago. The problem has been gradually improving since onset. The problem is controlled. Pertinent negatives include no anxiety, chest pain, headaches or palpitations. There are no associated agents to hypertension. Risk factors for coronary artery disease include obesity. Past treatments include beta blockers, calcium channel blockers and diuretics. The current treatment provides moderate improvement. There is no history of angina or kidney disease. There is no history of chronic renal disease.     Past Medical History:  Diagnosis Date  . Anemia    History  . Borderline diabetes    no meds  . Depression    no meds  . Endometrial polyp 05/30/2014  . Fibroids   . Goiter   . History of blood transfusion    approx 20 yrs ago at [facility]  . Hypertension   . Multiple thyroid nodules 11/13/2018  . Obesity, unspecified 05/30/2014  . Postmenopausal bleeding 05/30/2014  . Prediabetes  02/12/2019  . Sleep apnea    Does not use CPAP  . SVD (spontaneous vaginal delivery)    x 3  . Unspecified sleep apnea 05/30/2014  . Vitamin D deficiency 11/29/2018     Family History  Problem Relation Age of Onset  . Diabetes Mother   . Hypertension Mother   . Alcohol abuse Mother   . Breast cancer Neg Hx      Current Outpatient Medications:  .  aspirin EC 81 MG tablet, Take 81 mg by mouth daily., Disp: , Rfl:  .  Cholecalciferol (VITAMIN D) 2000 UNITS CAPS, Take 2,000 Units by mouth daily., Disp: , Rfl:  .  lisinopril-hydrochlorothiazide (ZESTORETIC) 20-25 MG tablet, TAKE 1 TABLET EVERY DAY, Disp: 30 tablet, Rfl: 0 .  Specialty Vitamins Products (BIOTIN PLUS KERATIN) 10000-100 MCG-MG TABS, Take 1 tablet by mouth daily., Disp: , Rfl:  .  Calcium-Magnesium-Zinc 333-133-5 MG TABS, Take 1 tablet by mouth daily. (Patient not taking: Reported on 06/03/2020), Disp: , Rfl:  .  metoprolol tartrate (LOPRESSOR) 25 MG tablet, Take 1 tablet (25 mg total) by mouth 2 (two) times daily. (Patient not taking: Reported on 06/03/2020), Disp: 180 tablet, Rfl: 2   Allergies  Allergen Reactions  . Penicillins Hives     Review of Systems  Constitutional: Negative for fatigue.  Respiratory: Negative.   Cardiovascular: Negative.  Negative for chest pain, palpitations and leg swelling.  Endocrine: Negative  for polydipsia, polyphagia and polyuria.  Genitourinary: Negative.   Allergic/Immunologic: Negative.   Neurological: Negative for dizziness and headaches.  Psychiatric/Behavioral: Negative.      Today's Vitals   06/04/20 1202  BP: 124/80  Pulse: 86  Temp: 97.9 F (36.6 C)  TempSrc: Oral  Weight: 253 lb 6.4 oz (114.9 kg)  PainSc: 0-No pain   Body mass index is 43.5 kg/m.   Objective:  Physical Exam Vitals reviewed.  Constitutional:      General: She is not in acute distress.    Appearance: Normal appearance. She is well-developed. She is obese.  Eyes:     Pupils: Pupils are equal,  round, and reactive to light.  Cardiovascular:     Rate and Rhythm: Normal rate and regular rhythm.     Pulses: Normal pulses.     Heart sounds: Normal heart sounds. No murmur heard.   Pulmonary:     Effort: Pulmonary effort is normal. No respiratory distress.     Breath sounds: Normal breath sounds.  Musculoskeletal:        General: Normal range of motion.  Skin:    General: Skin is warm and dry.     Capillary Refill: Capillary refill takes less than 2 seconds.  Neurological:     General: No focal deficit present.     Mental Status: She is alert and oriented to person, place, and time.     Cranial Nerves: No cranial nerve deficit.  Psychiatric:        Mood and Affect: Mood normal.        Behavior: Behavior normal.        Thought Content: Thought content normal.        Judgment: Judgment normal.         Assessment And Plan:   1. Hypertension, unspecified type . B/P is well controlled.  . CMP ordered to check renal function.  . Encouraged to continue with regular exercise and dietary modification was stressed to the patient.  . Encouraged to follow a low salt diet - CMP14+EGFR  2. Prediabetes  Chronic, stable  Encouraged to avoid sugary foods and drinks.  - Hemoglobin A1c - CMP14+EGFR  3. Vitamin D deficiency  Will check vitamin D level and supplement as needed.     Also encouraged to spend 15 minutes in the sun daily.  - VITAMIN D 25 Hydroxy (Vit-D Deficiency, Fractures)  4. Memory difficulties  Noticing forgetting where she is placing items  Will check for metabolic cause   Encouraged to do word puzzles or other word games to help with memory since she is retired  - Vitamin B12  5. Encounter for screening  Will check varicella titer to see if she is able to get shingles vaccine - Varicella zoster antibody, IgG      Marylu Lund, RN

## 2020-06-04 NOTE — Progress Notes (Signed)
This visit occurred during the SARS-CoV-2 public health emergency.  Safety protocols were in place, including screening questions prior to the visit, additional usage of staff PPE, and extensive cleaning of exam room while observing appropriate contact time as indicated for disinfecting solutions.  Subjective:   Linda Mccoy is a 69 y.o. female who presents for Medicare Annual (Subsequent) preventive examination.  Review of Systems    n/a Cardiac Risk Factors include: advanced age (>57mn, >>74women);hypertension;obesity (BMI >30kg/m2)     Objective:    Today's Vitals   06/04/20 1228  BP: 124/80  Pulse: 86  Temp: 97.9 F (36.6 C)  TempSrc: Oral  Weight: 253 lb (114.8 kg)  Height: 5' 5.6" (1.666 m)   Body mass index is 41.33 kg/m.  Advanced Directives 06/04/2020 10/30/2019 11/13/2018 05/23/2014  Does Patient Have a Medical Advance Directive? No No Yes Patient does not have advance directive;Patient would not like information  Does patient want to make changes to medical advance directive? - - Yes (MAU/Ambulatory/Procedural Areas - Information given) -  Would patient like information on creating a medical advance directive? No - Patient declined Yes (MAU/Ambulatory/Procedural Areas - Information given) - -  Pre-existing out of facility DNR order (yellow form or pink MOST form) - - - No    Current Medications (verified) Outpatient Encounter Medications as of 06/04/2020  Medication Sig  . aspirin EC 81 MG tablet Take 81 mg by mouth daily.  . Calcium-Magnesium-Zinc 333-133-5 MG TABS Take 1 tablet by mouth daily. (Patient not taking: Reported on 06/03/2020)  . Cholecalciferol (VITAMIN D) 2000 UNITS CAPS Take 2,000 Units by mouth daily.  .Marland Kitchenlisinopril-hydrochlorothiazide (ZESTORETIC) 20-25 MG tablet TAKE 1 TABLET EVERY DAY  . metoprolol tartrate (LOPRESSOR) 25 MG tablet Take 1 tablet (25 mg total) by mouth 2 (two) times daily. (Patient not taking: Reported on 06/03/2020)  .  Specialty Vitamins Products (BIOTIN PLUS KERATIN) 10000-100 MCG-MG TABS Take 1 tablet by mouth daily.   No facility-administered encounter medications on file as of 06/04/2020.    Allergies (verified) Penicillins   History: Past Medical History:  Diagnosis Date  . Anemia    History  . Borderline diabetes    no meds  . Depression    no meds  . Endometrial polyp 05/30/2014  . Fibroids   . Goiter   . History of blood transfusion    [facility] . Hypertension   . Multiple thyroid nodules 11/13/2018  . Obesity, unspecified 05/30/2014  . Postmenopausal bleeding 05/30/2014  . Prediabetes 02/12/2019  . Sleep apnea    Does not use CPAP  . SVD (spontaneous vaginal delivery)    x 3  . Unspecified sleep apnea 05/30/2014  . Vitamin D deficiency 11/29/2018   Past Surgical History:  Procedure Laterality Date  . COLONOSCOPY    . DILATATION & CURRETTAGE/HYSTEROSCOPY WITH RESECTOCOPE N/A 05/30/2014   Procedure: DILATATION & CURETTAGE/HYSTEROSCOPY WITH RESECTion of endometrial polyp;  Surgeon: AEna Dawley MD;  [facility];  Service: Gynecology;  Laterality: N/A;  . TUBAL LIGATION     Family History  Problem Relation Age of Onset  . Diabetes Mother   . Hypertension Mother   . Alcohol abuse Mother   . Breast cancer Neg Hx    Social History   Socioeconomic History  . Marital status: Widowed    Spouse name: Not on file  . Number of children: Not on file  . Years of education: Not on file  . Highest education  level: Not on file  Occupational History  . Occupation: retired  Tobacco Use  . Smoking status: Never Smoker  . Smokeless tobacco: Never Used  Vaping Use  . Vaping Use: Never used  Substance and Sexual Activity  . Alcohol use: Not Currently  . Drug use: No  . Sexual activity: Not Currently    Partners: Male    Birth control/protection: Post-menopausal, Surgical    Comment: BTL   Other Topics Concern  . Not on file  Social History Narrative  . Not  on file   Social Determinants of Health   Financial Resource Strain: Low Risk   . Difficulty of Paying Living Expenses: Not hard at all  Food Insecurity: No Food Insecurity  . Worried About Charity fundraiser in the Last Year: Never true  . Ran Out of Food in the Last Year: Never true  Transportation Needs: No Transportation Needs  . Lack of Transportation (Medical): No  . Lack of Transportation (Non-Medical): No  Physical Activity: Sufficiently Active  . Days of Exercise per Week: 6 days  . Minutes of Exercise per Session: 60 min  Stress: No Stress Concern Present  . Feeling of Stress : Not at all  Social Connections:   . Frequency of Communication with Friends and Family:   . Frequency of Social Gatherings with Friends and Family:   . Attends Religious Services:   . Active Member of Clubs or Organizations:   . Attends Archivist Meetings:   Marland Kitchen Marital Status:     Tobacco Counseling Counseling given: Not Answered   Clinical Intake:  Pre-visit preparation completed: Yes  Pain : No/denies pain     Nutritional Status: BMI > 30  Obese Nutritional Risks: None Diabetes: No  How often do you need to have someone help you when you read instructions, pamphlets, or other written materials from your doctor or pharmacy?: 1 - Never What is the last grade level you completed in school?: 12th grade  Diabetic? no  Interpreter Needed?: No  Information entered by :: NAllen LPN   Activities of Daily Living In your present state of health, do you have any difficulty performing the following activities: 06/04/2020 10/30/2019  Hearing? Y Y  Comment a little off due to low tone  Vision? N N  Difficulty concentrating or making decisions? Y N  Comment some -  Walking or climbing stairs? N N  Dressing or bathing? N N  Doing errands, shopping? N N  Preparing Food and eating ? N N  Using the Toilet? N N  In the past six months, have you accidently leaked urine? Y Y    Comment - when holds urine too long  Do you have problems with loss of bowel control? N N  Managing your Medications? N N  Managing your Finances? N N  Housekeeping or managing your Housekeeping? N N  Some recent data might be hidden    Patient Care Team: Minette Brine, FNP as PCP - General ([place]) Jettie Booze, MD as PCP - Cardiology (Cardiology) Daneen Schick as Social Worker [place], Claudette Stapler, RN as Case Manager  Indicate any recent Medical Services you may have received from other than [facility] providers in the past year (date may be approximate).     Assessment:   This is a routine wellness examination for [place].  Hearing/Vision screen  Hearing Screening   '125Hz'  '250Hz'  '500Hz'  '1000Hz'  '2000Hz'  '3000Hz'  '4000Hz'  '6000Hz'  '8000Hz'   Right ear:  Left ear:           Vision Screening Comments: Regular eye exams. Dr. [place]  Dietary issues and exercise activities discussed: Current Exercise Habits: Home exercise routine, Type of exercise: walking, Time (Minutes): 60, Frequency (Times/Week): 6, Weekly Exercise (Minutes/Week): 360  Goals    .  "I am having palpitations and having to wear a heart monitor" (pt-stated)      Current Barriers:  Marland Kitchen Knowledge Deficits related to diagnosis and treatment for abnormal EKG . Chronic Disease Management support and education needs related to Abnormal EKG, HTN, Obesity, Prediabetes  Nurse Case Manager Clinical Goal(s):  Marland Kitchen Over the next 90 days, patient will work with her health care team to address needs related to disease education and support for diagnosis/treatment plan for Abnormal EKG  . Over the next 30 days, patient will have completed her ordered blood work including her TSH level at PCP office as prescribed by PCP Goal Met 01/23/20  CCM SW Interventions: Completed 05/19/20 . Performed chart review to note patient seen by PCP 01/23/20 to complete ordered labs o 3/16- Cardiology visit Dx of SVT- non life threatening. Patient  instructed to continue beta blocker and call is symptoms worsening or if patient would like to try alternative therapy . Successful outbound call placed to the patient who reports beta blocker continues to be effective . No acute concerns surrounding heart palpitations . Advised the patient to contact PCP and/or cardiologist with future concerns . Collaboration with RN Care Manager regarding goal progression . Scheduled follow up call with RN Care Manager over the next 30 days  Interventions:  . Determined patient f/u with Cardiologist, Dr. Irish Lack to further evaluate her abnormal EKG and c/o increased heart palpitations and is prescribed to wear a Zio heart monitor x 14 days in which she is doing   . Evaluation of current treatment plan related to HTN and Abnormal EKG and patient's adherence to plan as established by provider. . Provided education to patient re: importance of medication, diet and exercise adherence for most effectiveness for BP control; education provided related to the link between thyroid disease and hypertension, and abnormal thyroid function that may lead to heart palpitations; provided education related to target BP <130/80; Determined patient is currently at target BP  . Reviewed medications with patient and discussed frequency, dosage and indication for antihypertensive medication; patient denies having financial hardship or AE . Discussed plans with patient for ongoing care management follow up and provided patient with direct contact information for care management team . Advised patient, providing education and rationale, to monitor blood pressure daily and record, calling the CCM team and or PCP for findings outside established parameters.  . Provided patient with printed educational materials related to Life's Simple 7, Is there a link between thyroid disease hypertension?  Patient Self Care Activities:  . Self administers medications as prescribed . Attends all  scheduled provider appointments . Calls pharmacy for medication refills . Performs ADL's independently . Performs IADL's independently . Calls provider office for new concerns or questions  Please see past updates related to this goal by clicking on the "Past Updates" button in the selected goal      .  "I don't know if I have thyroid disease" (pt-stated)      Current Barriers:  Marland Kitchen Knowledge Deficits related to evaluation and treatment of thyroid disease . Chronic Disease Management support and education needs related to Abnormal TSH, Multi-thyroid nodules, HTN, Obesity, Prediabetes  Nurse Case Manager  Clinical Goal(s):  Marland Kitchen Over the next 90 days, patient will work with CCM RN CM and PCP to address needs related to disease management, education and support to help increase knowledge and understanding of thyroid disease and Self Health management of this condition  . Over the next 30 days, patient will have completed her ordered blood work including her TSH level at PCP office as prescribed by PCP  Goal Met 01/23/20  CCM SW Interventions: Completed 05/19/20 . Performed chart review to note recent TSH level checked on 01/23/20 WNL . Successful outbound call placed to the patient o No acute concerns at this time . Collaboration with RN Care Manager o Scheduled follow up call over the next 30 days  Interventions:  . Determined patient has incomplete labs including a TSH following her OV with PCP provider Minette Brine, FNP completed on 11/28/19 . Determined patient is unsure if she has thyroid disease and is receiving no treatment at this time for this condition and did not f/u with the ENT specialist referred by PCP provider Minette Brine, FNP following her last OV . Discussed patient previously had a transportation barrier but this is no longer an issue; Instructed patient to call the [place] office to schedule a lab visit in order to complete her labs ordered in December so that her PCP can determine  if further evaluation and treatment is needed, patient agreed to go in this week to complete this blood work  . Evaluation of current treatment plan related to Abnormal TSH, Multi-thyroid nodules and patient's adherence to plan as established by provider. . Advised patient to of past referral by PCP for ENT to evaluate thyroid nodules; determined patient did not f/u with this specialist . Provided education to patient re: past abnormal TSH suggesting she has an overactive thyroid; Reviewed past imaging to confirm multi-thyroid nodules; educated patient on basic disease process of hyperthyroidism, potential cause and treatment  . Reviewed medications with patient and discussed patient is not prescribed to take thyroid medication at this time . Collaborated with PCP provider Minette Brine, FNP regarding patient's incomplete labs previously ordered including a TSH; advised patient will be calling the office to schedule a lab visit . Discussed plans with patient for ongoing care management follow up and provided patient with direct contact information for care management team . Provided patient with printed  educational materials related to Is Your Thyroid Disease Causing Your High Blood Pressure?; What Women Need to Know About Thyroid Disease  Patient Self Care Activities:  . Patient verbalizes understanding of plan to call the PCP office to schedule a lab visit for blood work to evaluate her TSH level . Self administers medications as prescribed . Attends all scheduled provider appointments . Calls pharmacy for medication refills . Performs ADL's independently . Performs IADL's independently . Calls provider office for new concerns or questions  Please see past updates related to this goal by clicking on the "Past Updates" button in the selected goal      .  "I don't now if I have diabetes" (pt-stated)      Current Barriers:  Marland Kitchen Knowledge Deficits related to disease process and Self Health  Management of Pre-diabetes . Chronic Disease Management support and education needs related to Pre-diabetes, HTN, Obesity,   Nurse Case Manager Clinical Goal(s):  Marland Kitchen Over the next 90 days, patient will work with the CCM team and PCP to address needs related to disease education and support for improved Self Health management of Diabetes .  Over the next 30 days, patient will schedule a lab visit with her PCP office to complete ordered blood work for evaluation of Pre-diabetes Completed 01/23/20  CCM SW Interventions: Completed 05/19/20 . Performed chart review to note most recent A1C reading of 5.9 drawn on 01/23/20 . Successful outbound call placed to the patient to assess for acute care management needs- none identified at this time . Advised the patient RN Care Manager will contact her over the next 30 days  o Encouraged the patient to contact care management team sooner if needed . Collaboration with RN Care Manager regarding goal progression  Interventions:  . Evaluation of current treatment plan related to Pre-diabetes and patient's adherence to plan as established by provider. . Provided education to patient re: past A1C indicated pre-diabetes; Determined patient did not complete ordered blood work to re-evaluate her A1C following her 11/28/19 PCP OV; Encouraged patient to get this done ASAP so her PCP and the CCM RN CM can determine if further evaluation/education is needed to help support and better manage this disease process, pt is agreeable; Determined patient was having transportation issues in the past but this has been resolved . Discussed plans with patient for ongoing care management follow up and provided patient with direct contact information for care management team . Provided patient with printed educational materials related to Diabetes Management Using the Plate, Carb Counting, Carb Choices   Patient Self Care Activities:  . Patient verbalizes understanding of plan to contact  the PCP office to schedule a lab visit to complete ordered blood work . Self administers medications as prescribed . Attends all scheduled provider appointments . Calls pharmacy for medication refills . Performs ADL's independently . Performs IADL's independently . Calls provider office for new concerns or questions  Please see past updates related to this goal by clicking on the "Past Updates" button in the selected goal      .  "I need to learn how to eat better" (pt-stated)      Current Barriers:  Marland Kitchen Knowledge Deficits related to how and what to eat to help improve nutrition, hydration and weight loss  . Chronic Disease Management support and education needs related to Obesity, HTN, Pre-diabetes  Nurse Case Manager Clinical Goal(s):  Marland Kitchen Over the next 90 days, patient will meet with RN Care Manager to address disease education and support to increase knowledge and understanding of how to make healthy food choices AEB by patient will lower her BMI and promote weight loss  Interventions:  . Evaluation of current treatment plan related to Obesity and patient's adherence to plan as established by provider. . Provided education to patient re: the importance of establishing short term realistic weight loss goals to help be successful as she implements healthier food choices for daily consumption; encouraged to keep a food journal and slowly substitute unhealthy foods for healthy foods one day or one week at a time; Provided active listening and support to patient regarding her desire to "eat healthier" . Discussed plans with patient for ongoing care management follow up and provided patient with direct contact information for care management team . Provided patient with printed educational materials related to How Should I Start: Taking Charge of Your Health and Well Being  Patient Self Care Activities:  . Self administers medications as prescribed . Attends all scheduled provider  appointments . Calls pharmacy for medication refills . Performs ADL's independently . Performs IADL's independently . Calls provider office for new concerns or questions  Initial goal documentation     .  DIET - INCREASE WATER INTAKE      10/30/2019, will try to write down to remind to drink water    .  Patient Stated      06/04/2020, wants to get under 200 pounds    .  Weight (lb) < 200 lb (90.7 kg)      She would like to lose weight.  She is walking, raking leaves and working in the yard.        Depression Screen PHQ 2/9 Scores 06/04/2020 11/28/2019 10/30/2019 11/13/2018  PHQ - 2 Score 1 0 2 0  PHQ- 9 Score - - 3 -    Fall Risk Fall Risk  06/04/2020 11/28/2019 10/30/2019 11/13/2018  Falls in the past year? 0 0 0 0  Risk for fall due to : Medication side effect - Medication side effect -  Follow up Falls evaluation completed;Education provided;Falls prevention discussed - Falls evaluation completed;Education provided;Falls prevention discussed -    Any stairs in or around the home? Yes If so, are there any without handrails? Yes  Home free of loose throw rugs in walkways, pet beds, electrical cords, etc? Yes  Adequate lighting in your home to reduce risk of falls? Yes   ASSISTIVE DEVICES UTILIZED TO PREVENT FALLS:  Life alert? No  Use of a cane, walker or w/c? Yes  Grab bars in the bathroom? No  Shower chair or bench in shower? No  Elevated toilet seat or a handicapped toilet? No   TIMED UP AND GO:  Was the test performed? No .  .   Gait slow and steady without use of assistive device  Cognitive Function:     6CIT Screen 06/04/2020 10/30/2019  What Year? 0 points 0 points  What month? 0 points 0 points  What time? 0 points 0 points  Count back from 20 0 points 0 points  Months in reverse 0 points 0 points  Repeat phrase 0 points 0 points  Total Score 0 0    Immunizations Immunization History  Administered Date(s) Administered  . Influenza, High Dose  Seasonal PF 11/13/2018  . PFIZER SARS-COV-2 Vaccination 01/25/2020, 02/19/2020    TDAP status: Up to date Flu Vaccine status: Up to date Pneumococcal vaccine status: Declined,  Education has been provided regarding the importance of this vaccine but patient still declined. Advised may receive this vaccine at local pharmacy or Health Dept. Aware to provide a copy of the vaccination record if obtained from local pharmacy or Health Dept. Verbalized acceptance and understanding.  Covid-19 vaccine status: Completed vaccines  Qualifies for Shingles Vaccine? Yes   Zostavax completed No   Shingrix Completed?: No.    Education has been provided regarding the importance of this vaccine. Patient has been advised to call insurance company to determine out of pocket expense if they have not yet received this vaccine. Advised may also receive vaccine at local pharmacy or Health Dept. Verbalized acceptance and understanding.  Screening Tests Health Maintenance  Topic Date Due  . PNA vac Low Risk Adult (1 of 2 - PCV13) 10/29/2020 (Originally 10/18/2018)  . INFLUENZA VACCINE  07/13/2020  . TETANUS/TDAP  09/08/2020  . MAMMOGRAM  02/11/2022  . COLONOSCOPY  08/30/2029  . DEXA SCAN  Completed  . COVID-19 Vaccine  Completed  . Hepatitis C Screening  Completed    Health Maintenance  There are no preventive care reminders to display for this patient.  Colorectal cancer screening: Completed 08/31/2019. Repeat every  7 years Mammogram status: Completed 02/12/2020. Repeat every year Bone Density status: Completed 12/08/2017.   Lung Cancer Screening: (Low Dose CT Chest recommended if Age 46-80 years, 30 pack-year currently smoking OR have quit w/in 15years.) does not qualify.   Lung Cancer Screening Referral: no   Additional Screening:  Hepatitis C Screening: does qualify; Completed 11/13/2018  Vision Screening: Recommended annual ophthalmology exams for early detection of glaucoma and other disorders of  the eye. Is the patient up to date with their annual eye exam?  Yes  Who is the provider or what is the name of the office in which the patient attends annual eye exams? Dr. [place] If pt is not established with a provider, would they like to be referred to a provider to establish care? No .   Dental Screening: Recommended annual dental exams for proper oral hygiene  Community Resource Referral / Chronic Care Management: CRR required this visit?  No   CCM required this visit?  No      Plan:     I have personally reviewed and noted the following in the patient's chart:   . Medical and social history . Use of alcohol, tobacco or illicit drugs  . Current medications and supplements . Functional ability and status . Nutritional status . Physical activity . Advanced directives . List of other physicians . Hospitalizations, surgeries, and ER visits in previous 12 months . Vitals . Screenings to include cognitive, depression, and falls . Referrals and appointments  In addition, I have reviewed and discussed with patient certain preventive protocols, quality metrics, and best practice recommendations. A written personalized care plan for preventive services as well as general preventive health recommendations were provided to patient.     Kellie Simmering, LPN   07/25/8870   Nurse Notes: Patient wants to get under 200 pounds. Working on [employer]. Looking to eat more fruits and vegetables.

## 2020-06-05 LAB — VARICELLA ZOSTER ANTIBODY, IGG: Varicella zoster IgG: 1245 index (ref >165)

## 2020-06-05 LAB — CMP14+EGFR
ALT: 12 IU/L (ref 0–32)
AST: 25 IU/L (ref 0–40)
Albumin/Globulin Ratio: 1.3 (ref 1.2–2.2)
Albumin: 4 g/dL (ref 3.8–4.8)
Alkaline Phosphatase: 75 IU/L (ref 48–121)
BUN/Creatinine Ratio: 21 (ref 12–28)
BUN: 16 mg/dL (ref 8–27)
Bilirubin Total: 0.3 mg/dL (ref 0.0–1.2)
CO2: 29 mmol/L (ref 20–29)
Calcium: 9.6 mg/dL (ref 8.7–10.3)
Chloride: 95 mmol/L — ABNORMAL LOW (ref 96–106)
Creatinine, Ser: 0.76 mg/dL (ref 0.57–1.00)
GFR calc Af Amer: 95 mL/min/{1.73_m2} (ref >59)
GFR calc non Af Amer: 82 mL/min/{1.73_m2} (ref >59)
Globulin, Total: 3 g/dL (ref 1.5–4.5)
Glucose: 98 mg/dL (ref 65–99)
Potassium: 4.3 mmol/L (ref 3.5–5.2)
Sodium: 136 mmol/L (ref 134–144)
Total Protein: 7 g/dL (ref 6.0–8.5)

## 2020-06-05 LAB — VITAMIN D 25 HYDROXY (VIT D DEFICIENCY, FRACTURES): Vit D, 25-Hydroxy: 41.3 ng/mL (ref 30.0–100.0)

## 2020-06-05 LAB — VITAMIN B12: Vitamin B-12: 708 pg/mL (ref 232–1245)

## 2020-06-05 LAB — HEMOGLOBIN A1C
Est. average glucose Bld gHb Est-mCnc: 123 mg/dL
Hgb A1c MFr Bld: 5.9 % — ABNORMAL HIGH (ref 4.8–5.6)

## 2020-06-17 ENCOUNTER — Telehealth: Payer: Self-pay

## 2020-07-25 ENCOUNTER — Telehealth: Payer: Medicare HMO

## 2020-07-25 ENCOUNTER — Ambulatory Visit (INDEPENDENT_AMBULATORY_CARE_PROVIDER_SITE_OTHER): Payer: Medicare HMO

## 2020-07-25 ENCOUNTER — Other Ambulatory Visit: Payer: Self-pay

## 2020-07-25 DIAGNOSIS — I10 Essential (primary) hypertension: Secondary | ICD-10-CM | POA: Diagnosis not present

## 2020-07-25 DIAGNOSIS — R7303 Prediabetes: Secondary | ICD-10-CM

## 2020-07-25 DIAGNOSIS — E669 Obesity, unspecified: Secondary | ICD-10-CM

## 2020-07-30 NOTE — Chronic Care Management (AMB) (Signed)
Chronic Care Management   Follow Up Note   07/25/2020 Name: Linda Mccoy MRN: [id number] DOB: 20-May-1951  Referred by: Minette Brine, FNP Reason for referral : Chronic Care Management (FU RN CM Call )   Linda Mccoy is a 69 y.o. year old female who is a primary care patient of Minette Brine, [place]. The CCM team was consulted for assistance with chronic disease management and care coordination needs.    Review of patient status, including review of consultants reports, relevant laboratory and other test results, and collaboration with appropriate care team members and the patient's provider was performed as part of comprehensive patient evaluation and provision of chronic care management services.    SDOH (Social Determinants of Health) assessments performed: Yes - no acute challenges identified See Care Plan activities for detailed interventions related to [place])    Placed CCM RN CM outbound call to patient for a care plan update.   Outpatient Encounter Medications as of 07/25/2020  Medication Sig  . aspirin EC 81 MG tablet Take 81 mg by mouth daily.  . Calcium-Magnesium-Zinc 333-133-5 MG TABS Take 1 tablet by mouth daily. (Patient not taking: Reported on 06/03/2020)  . Cholecalciferol (VITAMIN D) 2000 UNITS CAPS Take 2,000 Units by mouth daily.  Marland Kitchen lisinopril-hydrochlorothiazide (ZESTORETIC) 20-25 MG tablet TAKE 1 TABLET EVERY DAY  . metoprolol tartrate (LOPRESSOR) 25 MG tablet Take 1 tablet (25 mg total) by mouth 2 (two) times daily. (Patient not taking: Reported on 06/03/2020)  . Specialty Vitamins Products (BIOTIN PLUS KERATIN) 10000-100 MCG-MG TABS Take 1 tablet by mouth daily.   No facility-administered encounter medications on file as of 07/25/2020.     Objective:  Lab Results  Component Value Date   HGBA1C 5.9 (H) 06/04/2020   HGBA1C 5.9 (H) 01/23/2020   HGBA1C 5.9 (H) 11/13/2018   Lab Results  Component Value Date   MICROALBUR 80 10/30/2019   LDLCALC 80  11/13/2018   CREATININE 0.76 06/04/2020   BP Readings from Last 3 Encounters:  06/04/20 124/80  06/04/20 124/80  02/26/20 122/78    Goals Addressed              This Visit's Progress     Patient Stated   .  COMPLETED: "I am having palpitations and having to wear a heart monitor" (pt-stated)        Current Barriers:  Marland Kitchen Knowledge Deficits related to diagnosis and treatment for abnormal EKG . Chronic Disease Management support and education needs related to Abnormal EKG, HTN, Obesity, Prediabetes  Nurse Case Manager Clinical Goal(s):  Marland Kitchen Over the next 90 days, patient will work with her health care team to address needs related to disease education and support for diagnosis/treatment plan for Abnormal EKG  Goal Met  . Over the next 30 days, patient will have completed her ordered blood work including her TSH level at PCP office as prescribed by PCP Goal Met 01/23/20  CCM RN CM Interventions: 07/25/20 call completed with patient  . Evaluation of current treatment plan related to HTN and Abnormal EKG and patient's adherence to plan as established by provider . Determined patient completed an OV with Cardiologist Dr. Lovena Le for follow up regarding Palpitations with the following Assessment/Plan reviewed and discussed:  o Assess/Plan: o 1.SVT - I have discussed the treatment options and have explained that the episodes are not life threatening. She will continue her beta blocker for now. She will call us if she has worsening and would like to try a different  med or consider catheter ablation which we discussed.  o 2. Obesity - she is encouraged to lose weight.  o 3. HTN - her SBP is reasonably well controlled. We will follow . Determined patient has a good understanding of her treatment recommendations to continue taking a beta blocker and to notify the Cardiology team right away if she has worsening symptoms for further evaluation and consideration of cardiac catheter ablation . Discussed  plans with patient for ongoing care management follow up and provided patient with direct contact information for care management team  Patient Self Care Activities:  . Self administers medications as prescribed . Attends all scheduled provider appointments . Calls pharmacy for medication refills . Performs ADL's independently . Performs IADL's independently . Calls provider office for new concerns or questions  Please see past updates related to this goal by clicking on the "Past Updates" button in the selected goal      .  COMPLETED: "I don't know if I have thyroid disease" (pt-stated)        Current Barriers:  Marland Kitchen Knowledge Deficits related to evaluation and treatment of thyroid disease . Chronic Disease Management support and education needs related to Abnormal TSH, Multi-thyroid nodules, HTN, Obesity, Prediabetes  Nurse Case Manager Clinical Goal(s):  Marland Kitchen Over the next 90 days, patient will work with CCM RN CM and PCP to address needs related to disease management, education and support to help increase knowledge and understanding of thyroid disease and Self Health management of this condition  Goal Met  . Over the next 30 days, patient will have completed her ordered blood work including her TSH level at PCP office as prescribed by PCP  Goal Met   CCM RN CM Interventions:  07/25/20 call completed with patient  . Evaluation of current treatment plan related to Abnormal TSH, Multi-thyroid nodules and patient's adherence to plan as established by provider. . Determined patient completed a PCP f/u on 01/23/20 with THS levels rechecked and found to be within normal limits . Determined patient denies having further questions or concerns related to thyroid dysfunction at this time  . Discussed plans with patient for ongoing care management follow up and provided patient with direct contact information for care management team  Patient Self Care Activities:  . Patient verbalizes understanding  of plan to call the PCP office to schedule a lab visit for blood work to evaluate her TSH level . Self administers medications as prescribed . Attends all scheduled provider appointments . Calls pharmacy for medication refills . Performs ADL's independently . Performs IADL's independently . Calls provider office for new concerns or questions  Please see past updates related to this goal by clicking on the "Past Updates" button in the selected goal      .  "I don't now if I have diabetes" (pt-stated)   On track     Current Barriers:  Marland Kitchen Knowledge Deficits related to disease process and Self Health Management of Pre-diabetes . Chronic Disease Management support and education needs related to Pre-diabetes, HTN, Obesity,   Nurse Case Manager Clinical Goal(s):  . 07/25/20 New Over the next 90 days, patient will work with the CCM team and PCP to address needs related to disease education and support for improved Self Health management of Diabetes  CCM RN CM Interventions:  07/25/20 call completed with patient . Evaluation of current treatment plan related to Pre-diabetes and patient's adherence to plan as established by provider. . Provided education to patient re: current A1c  of 5.9 %; Educated patient this is prediabetic range; Educated on dietary and recommendations per ADA; Educated on daily glycemic control FBS 80-130, <180 after meals; Educated on 15'15' rule  . Educated providing rationale to monitor FBS 1-2 daily before meals and record readings, notifying the CCM team and or PCP of abnormal readings outside of parameters discussed . Determined patient is not prescribed to take DM medication at this time . Discussed plans with patient for ongoing care management follow up and provided patient with direct contact information for care management team . Provided patient with printed educational materials related to Diabetes Care Schedule; Preventing Complications with Diabetes; Grocery  Shopping with Diabetes  Patient Self Care Activities:  . Patient verbalizes understanding of plan to contact the PCP office to schedule a lab visit to complete ordered blood work . Self administers medications as prescribed . Attends all scheduled provider appointments . Calls pharmacy for medication refills . Performs ADL's independently . Performs IADL's independently . Calls provider office for new concerns or questions  Please see past updates related to this goal by clicking on the "Past Updates" button in the selected goal      .  "I need to learn how to eat better" (pt-stated)   Not on track     Current Barriers:  Marland Kitchen Knowledge Deficits related to how and what to eat to help improve nutrition, hydration and weight loss  . Chronic Disease Management support and education needs related to Obesity, HTN, Pre-diabetes  Nurse Case Manager Clinical Goal(s):  . 07/25/20 New Over the next 90 days, patient will meet with RN Care Manager to address disease education and support to increase knowledge and understanding of how to make healthy food choices AEB by patient will lower her BMI and promote weight loss    CCM RN CM Interventions:  07/25/20 call completed with patient  . Evaluation of current treatment plan related to Obesity and patient's adherence to plan as established by provider. . Reinforced to patient re: the importance of establishing short term realistic weight loss goals to help be successful as she implements healthier food choices for daily consumption; encouraged to keep a food journal and slowly substitute unhealthy foods for healthy foods one day or one week at a time;  . Provided active listening and support to patient regarding her desire to "eat healthier" . Discussed plans with patient for ongoing care management follow up and provided patient with direct contact information for care management team . Provided patient with printed educational materials related to 12  Weight Loss Tips: How to Get Started  Patient Self Care Activities:  . Self administers medications as prescribed . Attends all scheduled provider appointments . Calls pharmacy for medication refills . Performs ADL's independently . Performs IADL's independently . Calls provider office for new concerns or questions  Please see past updates related to this goal by clicking on the "Past Updates" button in the selected goal        Plan:   Telephone follow up appointment with care management team member scheduled for: 12/04/20  Barb Merino, RN, BSN, CCM Care Management Coordinator [facility]  Direct Phone: [phone]

## 2020-07-30 NOTE — Patient Instructions (Signed)
Visit Information  Goals Addressed      Patient Stated   .  COMPLETED: "I am having palpitations and having to wear a heart monitor" (pt-stated)        Current Barriers:  Linda Mccoy Knowledge Deficits related to diagnosis and treatment for abnormal EKG . Chronic Disease Management support and education needs related to Abnormal EKG, HTN, Obesity, Prediabetes  Nurse Case Manager Clinical Goal(s):  Linda Mccoy Over the next 90 days, patient will work with her health care team to address needs related to disease education and support for diagnosis/treatment plan for Abnormal EKG  Goal Met  . Over the next 30 days, patient will have completed her ordered blood work including her TSH level at PCP office as prescribed by PCP Goal Met 01/23/20  CCM RN CM Interventions: 07/25/20 call completed with patient  . Evaluation of current treatment plan related to HTN and Abnormal EKG and patient's adherence to plan as established by provider . Determined patient completed an OV with Cardiologist Dr. Lovena Le for follow up regarding Palpitations with the following Assessment/Plan reviewed and discussed:  o Assess/Plan: o 1.SVT - I have discussed the treatment options and have explained that the episodes are not life threatening. She will continue her beta blocker for now. She will call us if she has worsening and would like to try a different med or consider catheter ablation which we discussed.  o 2. Obesity - she is encouraged to lose weight.  o 3. HTN - her SBP is reasonably well controlled. We will follow . Determined patient has a good understanding of her treatment recommendations to continue taking a beta blocker and to notify the Cardiology team right away if she has worsening symptoms for further evaluation and consideration of cardiac catheter ablation . Discussed plans with patient for ongoing care management follow up and provided patient with direct contact information for care management team  Patient Self Care  Activities:  . Self administers medications as prescribed . Attends all scheduled provider appointments . Calls pharmacy for medication refills . Performs ADL's independently . Performs IADL's independently . Calls provider office for new concerns or questions  Please see past updates related to this goal by clicking on the "Past Updates" button in the selected goal      .  COMPLETED: "I don't know if I have thyroid disease" (pt-stated)        Current Barriers:  Linda Mccoy Knowledge Deficits related to evaluation and treatment of thyroid disease . Chronic Disease Management support and education needs related to Abnormal TSH, Multi-thyroid nodules, HTN, Obesity, Prediabetes  Nurse Case Manager Clinical Goal(s):  Linda Mccoy Over the next 90 days, patient will work with CCM RN CM and PCP to address needs related to disease management, education and support to help increase knowledge and understanding of thyroid disease and Self Health management of this condition  Goal Met  . Over the next 30 days, patient will have completed her ordered blood work including her TSH level at PCP office as prescribed by PCP  Goal Met   CCM RN CM Interventions:  07/25/20 call completed with patient  . Evaluation of current treatment plan related to Abnormal TSH, Multi-thyroid nodules and patient's adherence to plan as established by provider. . Determined patient completed a PCP f/u on 01/23/20 with THS levels rechecked and found to be within normal limits . Determined patient denies having further questions or concerns related to thyroid dysfunction at this time  . Discussed plans with patient for  ongoing care management follow up and provided patient with direct contact information for care management team  Patient Self Care Activities:  . Patient verbalizes understanding of plan to call the PCP office to schedule a lab visit for blood work to evaluate her TSH level . Self administers medications as prescribed . Attends all  scheduled provider appointments . Calls pharmacy for medication refills . Performs ADL's independently . Performs IADL's independently . Calls provider office for new concerns or questions  Please see past updates related to this goal by clicking on the "Past Updates" button in the selected goal      .  "I don't now if I have diabetes" (pt-stated)   On track     Current Barriers:  Linda Mccoy Knowledge Deficits related to disease process and Self Health Management of Pre-diabetes . Chronic Disease Management support and education needs related to Pre-diabetes, HTN, Obesity,   Nurse Case Manager Clinical Goal(s):  . 07/25/20 New Over the next 90 days, patient will work with the CCM team and PCP to address needs related to disease education and support for improved Self Health management of Diabetes  CCM RN CM Interventions:  07/25/20 call completed with patient . Evaluation of current treatment plan related to Pre-diabetes and patient's adherence to plan as established by provider. . Provided education to patient re: current A1c of 5.9 %; Educated patient this is prediabetic range; Educated on dietary and recommendations per ADA; Educated on daily glycemic control FBS 80-130, <180 after meals; Educated on 15'15' rule  . Educated providing rationale to monitor FBS 1-2 daily before meals and record readings, notifying the CCM team and or PCP of abnormal readings outside of parameters discussed . Determined patient is not prescribed to take DM medication at this time . Discussed plans with patient for ongoing care management follow up and provided patient with direct contact information for care management team . Provided patient with printed educational materials related to Diabetes Care Schedule; Preventing Complications with Diabetes; Grocery Shopping with Diabetes  Patient Self Care Activities:  . Patient verbalizes understanding of plan to contact the PCP office to schedule a lab visit to complete  ordered blood work . Self administers medications as prescribed . Attends all scheduled provider appointments . Calls pharmacy for medication refills . Performs ADL's independently . Performs IADL's independently . Calls provider office for new concerns or questions  Please see past updates related to this goal by clicking on the "Past Updates" button in the selected goal      .  "I need to learn how to eat better" (pt-stated)   Not on track     Current Barriers:  Linda Mccoy Knowledge Deficits related to how and what to eat to help improve nutrition, hydration and weight loss  . Chronic Disease Management support and education needs related to Obesity, HTN, Pre-diabetes  Nurse Case Manager Clinical Goal(s):  . 07/25/20 New Over the next 90 days, patient will meet with RN Care Manager to address disease education and support to increase knowledge and understanding of how to make healthy food choices AEB by patient will lower her BMI and promote weight loss    CCM RN CM Interventions:  07/25/20 call completed with patient  . Evaluation of current treatment plan related to Obesity and patient's adherence to plan as established by provider. . Reinforced to patient re: the importance of establishing short term realistic weight loss goals to help be successful as she implements healthier food choices for daily consumption;  encouraged to keep a food journal and slowly substitute unhealthy foods for healthy foods one day or one week at a time;  . Provided active listening and support to patient regarding her desire to "eat healthier" . Discussed plans with patient for ongoing care management follow up and provided patient with direct contact information for care management team . Provided patient with printed educational materials related to 12 Weight Loss Tips: How to Get Started  Patient Self Care Activities:  . Self administers medications as prescribed . Attends all scheduled provider  appointments . Calls pharmacy for medication refills . Performs ADL's independently . Performs IADL's independently . Calls provider office for new concerns or questions  Please see past updates related to this goal by clicking on the "Past Updates" button in the selected goal        Patient verbalizes understanding of instructions provided today.   Telephone follow up appointment with care management team member scheduled for: 12/04/20  Barb Merino, RN, BSN, CCM Care Management Coordinator [place] [facility]  Direct Phone: [phone]

## 2020-08-20 ENCOUNTER — Other Ambulatory Visit: Payer: Self-pay

## 2020-08-20 DIAGNOSIS — I10 Essential (primary) hypertension: Secondary | ICD-10-CM

## 2020-08-20 MED ORDER — LISINOPRIL-HYDROCHLOROTHIAZIDE 20-25 MG PO TABS: 1.0000 | ORAL_TABLET | Freq: Every day | ORAL | 1 refills | Status: DC

## 2020-09-14 IMAGING — US US BREAST BX W LOC DEV 1ST LESION IMG BX SPEC US GUIDE*R*
1 series · 9 of 9 positions shown · non-contrast
Comparison: Previous exam(s).

Addendum:
CLINICAL DATA: Right intraductal filling defect.

EXAM:
ULTRASOUND GUIDED 3 BREAST CORE NEEDLE BIOPSY

[Series 1: us breast bx w loc dev 1st lesion img bx spec us g · 0.06mm/px · 9 of 9 slices shown]
[im 1/9]
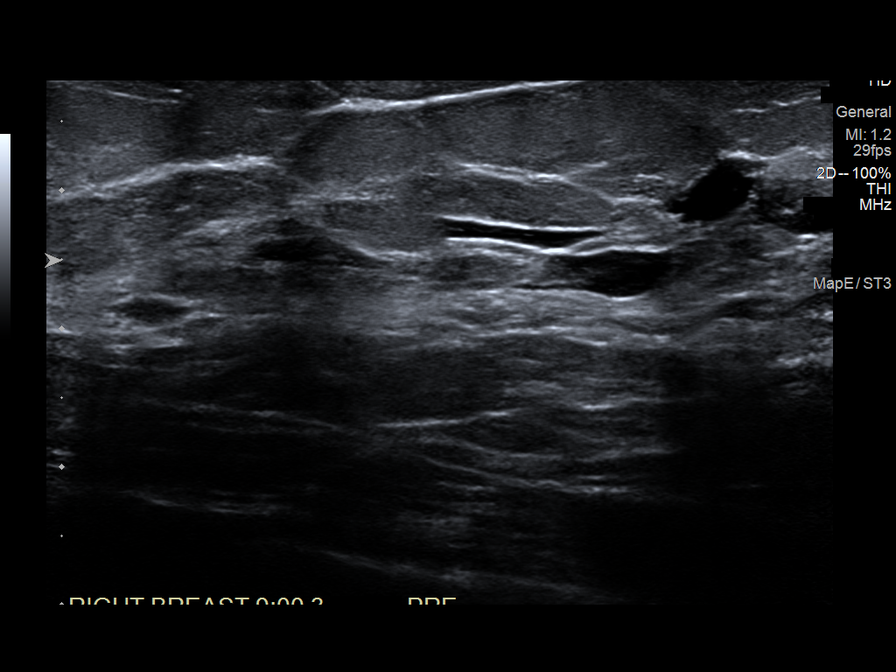
[im 2/9]
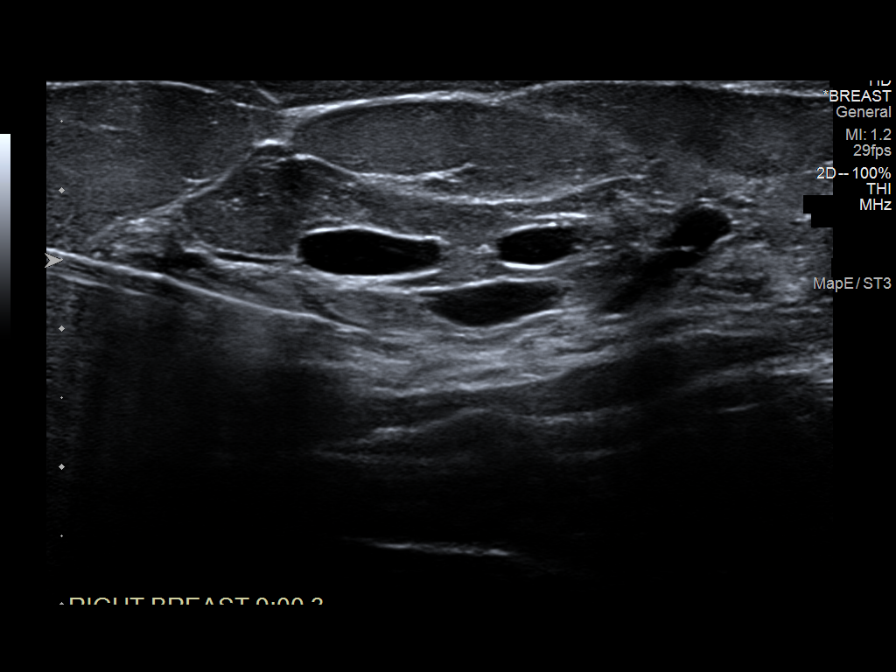
[im 3/9]
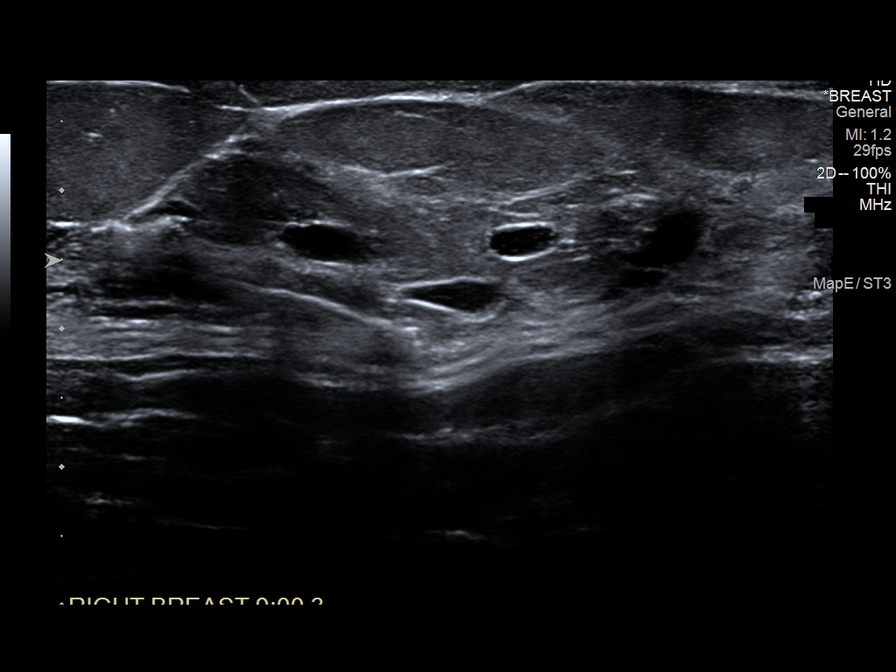
[im 4/9]
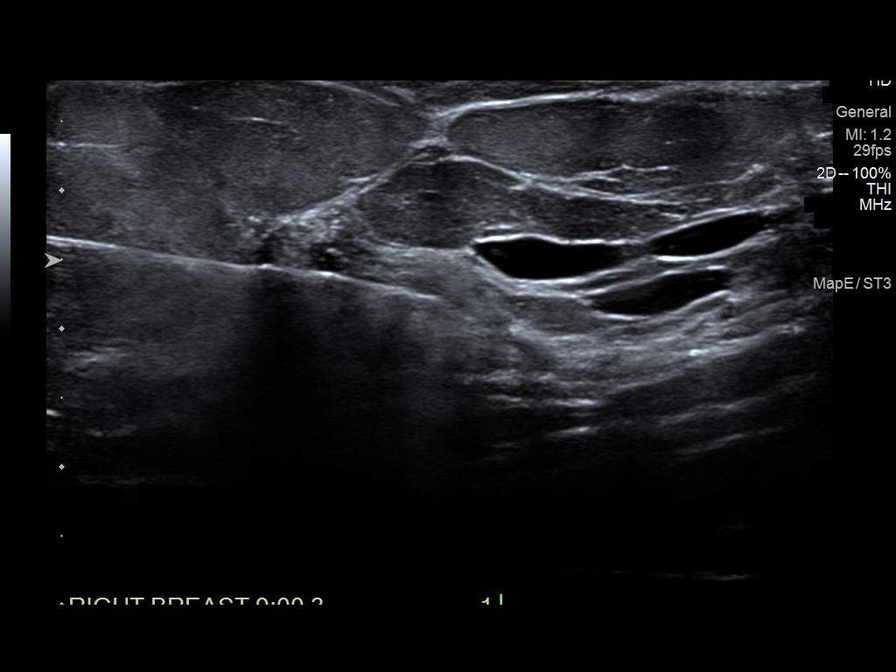
[im 5/9]
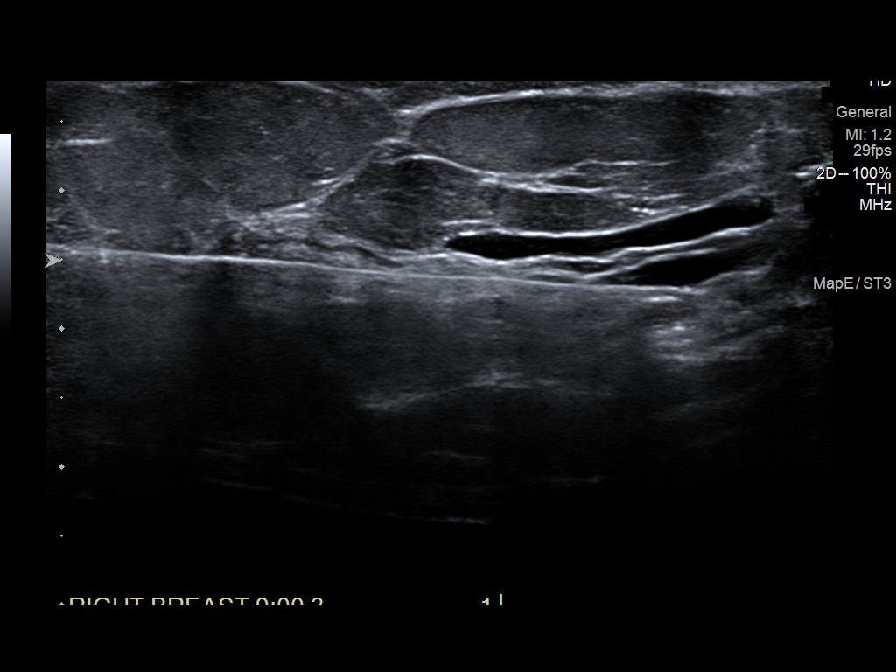
[im 6/9]
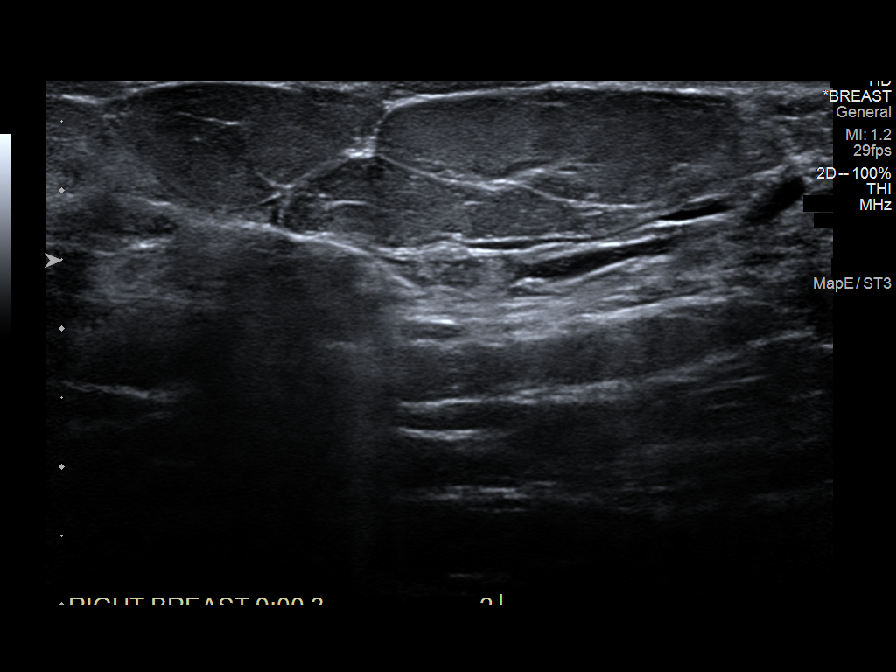
[im 7/9]
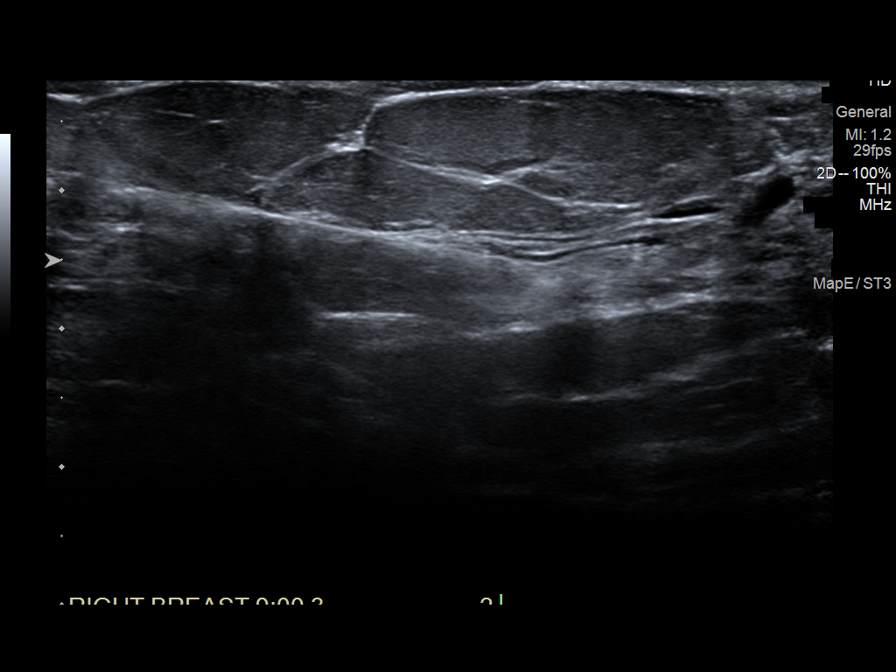
[im 8/9]
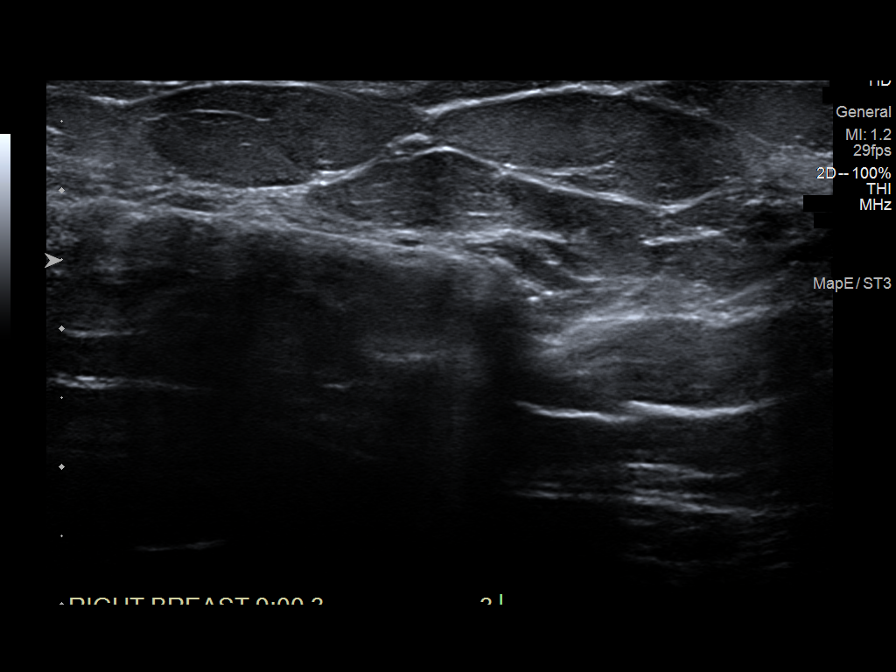
[im 9/9]
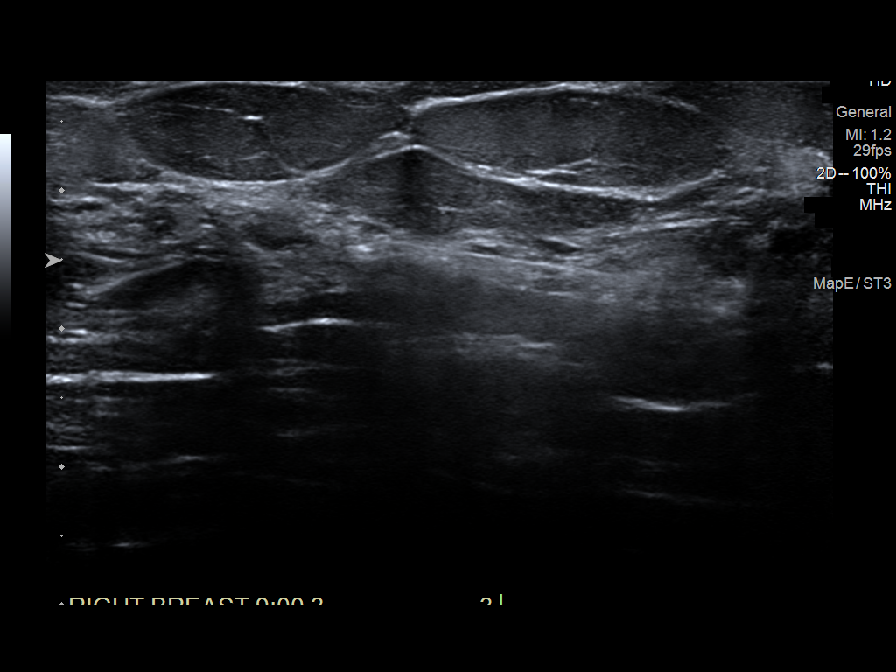

[9 of 9 positions shown; findings below may reference images not displayed]



Lesion quadrant: 9 o'clock

Using sterile technique and 1% lidocaine 1% lidocaine with
epinephrine as local anesthetic, under direct ultrasound
visualization, a 12 gauge Sigi device was used to perform
biopsy of an intraductal mass in the 9 o'clock region of the right
breast using a lateral to medial approach. A ribbon shaped tissue
marker clip was deployed into the biopsy cavity. Follow up 2 view
mammogram was performed and dictated separately.
IMPRESSION: Ultrasound guided biopsy of the right breast. No apparent
complications.

ADDENDUM:
Pathology revealed Right breast dilated ducts at 9 o'clock. This was
found to be concordant by Dr. Henk Par.

Pathology results were discussed with the patient by telephone. The
patient reported doing well after the biopsy. Post biopsy
instructions and care were reviewed and questions were answered. The
patient was encouraged to call [REDACTED] for any additional concerns.

The patient was asked to return for a right breast ultrasound in 6
months and informed a reminder notice would be sent regarding this
appointment.

Pathology results reported by Fridtjov Rosseland, RN on 01/09/2019.

*** End of Addendum ***

## 2020-10-30 ENCOUNTER — Other Ambulatory Visit: Payer: Self-pay

## 2020-10-30 DIAGNOSIS — I10 Essential (primary) hypertension: Secondary | ICD-10-CM

## 2020-10-30 MED ORDER — LISINOPRIL-HYDROCHLOROTHIAZIDE 20-25 MG PO TABS: 1.0000 | ORAL_TABLET | Freq: Every day | ORAL | 1 refills | Status: DC

## 2020-12-02 NOTE — Progress Notes (Signed)
I,Tianna Badgett,acting as a Education administrator for [employer], FNP.,have documented all relevant documentation on the behalf of Minette Brine, FNP,as directed by  Minette Brine, FNP while in the presence of Minette Brine, [place].  This visit occurred during the SARS-CoV-2 public health emergency.  Safety protocols were in place, including screening questions prior to the visit, additional usage of staff PPE, and extensive cleaning of exam room while observing appropriate contact time as indicated for disinfecting solutions.  Subjective:     Patient ID: Linda Mccoy , female    DOB: Jul 20, 1951 , 69 y.o.   MRN: [id number]   Chief Complaint  Patient presents with   Annual Exam    HPI  Patient is here for physical exam. She reports compliance with medications. She has no concerns at this time.  Wt Readings from Last 3 Encounters: 12/02/20 : 244 lb 3.2 oz (110.8 kg) 06/04/20 : 253 lb (114.8 kg) 06/04/20 : 253 lb 6.4 oz (114.9 kg)  Hypertension This is a chronic problem. The current episode started more than 1 year ago. The problem is unchanged. The problem is controlled. Pertinent negatives include no anxiety, chest pain or palpitations. There are no associated agents to hypertension. Risk factors for coronary artery disease include obesity. Past treatments include diuretics, ACE inhibitors and beta blockers. There are no compliance problems.  There is no history of chronic renal disease.     Past Medical History:  Diagnosis Date   Anemia    History   Borderline diabetes    no meds   Depression    no meds   Endometrial polyp 05/30/2014   Fibroids    Goiter    History of blood transfusion    approx 20 yrs ago at [facility]   Hypertension    Multiple thyroid nodules 11/13/2018   Obesity, unspecified 05/30/2014   Postmenopausal bleeding 05/30/2014   Prediabetes 02/12/2019   Sleep apnea    Does not use CPAP   SVD (spontaneous vaginal delivery)    x 3   Unspecified sleep apnea  05/30/2014   Vitamin D deficiency 11/29/2018     Family History  Problem Relation Age of Onset   Diabetes Mother    Hypertension Mother    Alcohol abuse Mother    Breast cancer Neg Hx      Current Outpatient Medications:    aspirin EC 81 MG tablet, Take 81 mg by mouth daily., Disp: , Rfl:    Calcium-Magnesium-Zinc 333-133-5 MG TABS, Take 1 tablet by mouth daily., Disp: , Rfl:    Cholecalciferol (VITAMIN D) 2000 UNITS CAPS, Take 2,000 Units by mouth daily., Disp: , Rfl:    lisinopril-hydrochlorothiazide (ZESTORETIC) 20-25 MG tablet, Take 1 tablet by mouth daily., Disp: 90 tablet, Rfl: 1   metoprolol tartrate (LOPRESSOR) 25 MG tablet, Take 1 tablet (25 mg total) by mouth 2 (two) times daily. (Patient not taking: Reported on 12/03/2020), Disp: 180 tablet, Rfl: 2   Tdap (BOOSTRIX) 5-2.5-18.5 LF-MCG/0.5 injection, Inject 0.5 mLs into the muscle once for 1 dose., Disp: 0.5 mL, Rfl: 0   Allergies  Allergen Reactions   Penicillins Hives      The patient states she uses post menopausal status for birth control. Last LMP was Patient's last menstrual period was 12/26/2010.. Negative for Dysmenorrhea and Negative for Menorrhagia. Negative for: breast discharge, breast lump(s), breast pain and breast self exam. Associated symptoms include abnormal vaginal bleeding. Pertinent negatives include abnormal bleeding (hematology), anxiety, decreased libido, depression, difficulty falling sleep, dyspareunia, history of  infertility, nocturia, sexual dysfunction, sleep disturbances, urinary incontinence, urinary urgency, vaginal discharge and vaginal itching. Diet regular; she avoids salt; she does admit to eating sweets. The patient states her exercise level is   The patient's tobacco use is:  Social History   Tobacco Use  Smoking Status Never Smoker  Smokeless Tobacco Never Used  . She has been exposed to passive smoke. The patient's alcohol use is:  Social History   Substance and Sexual  Activity  Alcohol Use Not Currently   Additional information: Last pap 2018, next one scheduled for 2021   Review of Systems  Constitutional: Negative.   HENT: Negative.   Eyes: Negative.   Respiratory: Negative.  Negative for cough.   Cardiovascular: Negative.  Negative for chest pain, palpitations and leg swelling.  Gastrointestinal: Negative.   Endocrine: Negative.   Genitourinary: Positive for vaginal discharge (one month - thin and clear denies odor).  Musculoskeletal: Negative.   Skin: Negative.   Allergic/Immunologic: Negative.   Neurological: Negative.   Hematological: Negative.   Psychiatric/Behavioral: Negative.      Today's Vitals   12/02/20 1720  BP: 126/82  Pulse: 90  Temp: 98.1 F (36.7 C)  TempSrc: Oral  Weight: 244 lb 3.2 oz (110.8 kg)  Height: 5' 5.6" (1.666 m)  PainSc: 0-No pain   Body mass index is 39.9 kg/m.   Objective:  Physical Exam Vitals reviewed. Exam conducted with a chaperone present.  Constitutional:      General: She is not in acute distress.    Appearance: Normal appearance. She is well-developed. She is obese.  HENT:     Head: Normocephalic and atraumatic.     Right Ear: Hearing, tympanic membrane, ear canal and external ear normal.     Left Ear: Hearing, tympanic membrane, ear canal and external ear normal.     Nose:     Comments: Deferred - masked    Mouth/Throat:     Comments: Deferred - masked Eyes:     General: Lids are normal.     Extraocular Movements: Extraocular movements intact.     Conjunctiva/sclera: Conjunctivae normal.     Pupils: Pupils are equal, round, and reactive to light.     Funduscopic exam:    Right eye: No papilledema.        Left eye: No papilledema.  Neck:     Thyroid: No thyroid mass.     Vascular: No carotid bruit.  Cardiovascular:     Rate and Rhythm: Normal rate and regular rhythm.     Pulses: Normal pulses.     Heart sounds: Normal heart sounds. No murmur heard.   Pulmonary:     Effort:  Pulmonary effort is normal. No respiratory distress.     Breath sounds: Normal breath sounds. No wheezing.  Chest:  Breasts:     Right: Normal. No mass, tenderness, axillary adenopathy or supraclavicular adenopathy.     Left: Normal. No mass, tenderness, axillary adenopathy or supraclavicular adenopathy.    Abdominal:     General: Abdomen is flat. Bowel sounds are normal.     Palpations: Abdomen is soft.  Genitourinary:    Pubic Area: No rash.      Tanner stage (genital): 5.     Labia:        Right: No rash or tenderness.        Left: No rash or tenderness.      Vagina: Normal.     Adnexa: Right adnexa normal and left adnexa normal.  Right: No tenderness.         Left: No tenderness.       Comments: Cervix is tilted posterior Musculoskeletal:        General: No swelling or tenderness. Normal range of motion.     Cervical back: Full passive range of motion without pain, normal range of motion and neck supple.     Right lower leg: No edema.     Left lower leg: No edema.  Lymphadenopathy:     Upper Body:     Right upper body: No supraclavicular, axillary or pectoral adenopathy.     Left upper body: No supraclavicular, axillary or pectoral adenopathy.  Skin:    General: Skin is warm and dry.     Capillary Refill: Capillary refill takes less than 2 seconds.  Neurological:     General: No focal deficit present.     Mental Status: She is alert and oriented to person, place, and time.     Cranial Nerves: No cranial nerve deficit.     Sensory: No sensory deficit.     Motor: No weakness.  Psychiatric:        Mood and Affect: Mood normal.        Behavior: Behavior normal.        Thought Content: Thought content normal.        Judgment: Judgment normal.         Assessment And Plan:     1. Health maintenance examination  Behavior modifications discussed and diet history reviewed.    Pt will continue to exercise regularly and modify diet with low GI, plant based foods  and decrease intake of processed foods.   Recommend intake of daily multivitamin, Vitamin D, and calcium.   Recommend mammogram and colonoscopy for preventive screenings, as well as recommend immunizations that include influenza, TDAP (Rx sent to pharmacy)  2. Hypertension, unspecified type  Chronic, good control  Continue with current medications  Will check kidney functions - CMP14+EGFR  3. Prediabetes Chronic, stable Continue with diet control and avoid sugary foods and drinks - Hemoglobin A1c - CMP14+EGFR - POCT Urinalysis Dipstick (81002) - POCT UA - Microalbumin  4. Vitamin D deficiency  Will check vitamin D level and supplement as needed.     Also encouraged to spend 15 minutes in the sun daily.   5. Dysuria  Urinalysis is normal  Will send urine for culture  6. Encounter for immunization  Will give tetanus vaccine today while in office. Refer to order management. TDAP will be administered to adults 71-69 years old every 10 years. - Tdap (BOOSTRIX) 5-2.5-18.5 LF-MCG/0.5 injection; Inject 0.5 mLs into the muscle once for 1 dose.  Dispense: 0.5 mL; Refill: 0  7. Encounter for Papanicolaou smear of cervix  PAP done, her cervix is tilted posterior - Cytology -Pap Smear  8. Other long term (current) drug therapy - CBC  9. Vaginal discharge - Cervicovaginal ancillary only     Patient was given opportunity to ask questions. Patient verbalized understanding of the plan and was able to repeat key elements of the plan. All questions were answered to their satisfaction.   Minette Brine, FNP   I, Minette Brine, FNP, have reviewed all documentation for this visit. The documentation on 12/03/20 for the exam, diagnosis, procedures, and orders are all accurate and complete.  THE PATIENT IS ENCOURAGED TO PRACTICE SOCIAL DISTANCING DUE TO THE COVID-19 PANDEMIC.

## 2020-12-02 NOTE — Patient Instructions (Signed)
Health Maintenance, Female Adopting a healthy lifestyle and getting preventive care are important in promoting health and wellness. Ask your health care provider about:  The right schedule for you to have regular tests and exams.  Things you can do on your own to prevent diseases and keep yourself healthy. What should I know about diet, weight, and exercise? Eat a healthy diet   Eat a diet that includes plenty of vegetables, fruits, low-fat dairy products, and lean protein.  Do not eat a lot of foods that are high in solid fats, added sugars, or sodium. Maintain a healthy weight Body mass index (BMI) is used to identify weight problems. It estimates body fat based on height and weight. Your health care provider can help determine your BMI and help you achieve or maintain a healthy weight. Get regular exercise Get regular exercise. This is one of the most important things you can do for your health. Most adults should:  Exercise for at least 150 minutes each week. The exercise should increase your heart rate and make you sweat (moderate-intensity exercise).  Do strengthening exercises at least twice a week. This is in addition to the moderate-intensity exercise.  Spend less time sitting. Even light physical activity can be beneficial. Watch cholesterol and blood lipids Have your blood tested for lipids and cholesterol at 67 years of age, then have this test every 5 years. Have your cholesterol levels checked more often if:  Your lipid or cholesterol levels are high.  You are older than 67 years of age.  You are at high risk for heart disease. What should I know about cancer screening? Depending on your health history and family history, you may need to have cancer screening at various ages. This may include screening for:  Breast cancer.  Cervical cancer.  Colorectal cancer.  Skin cancer.  Lung cancer. What should I know about heart disease, diabetes, and high blood  pressure? Blood pressure and heart disease  High blood pressure causes heart disease and increases the risk of stroke. This is more likely to develop in people who have high blood pressure readings, are of African descent, or are overweight.  Have your blood pressure checked: ? Every 3-5 years if you are 18-39 years of age. ? Every year if you are 40 years old or older. Diabetes Have regular diabetes screenings. This checks your fasting blood sugar level. Have the screening done:  Once every three years after age 40 if you are at a normal weight and have a low risk for diabetes.  More often and at a younger age if you are overweight or have a high risk for diabetes. What should I know about preventing infection? Hepatitis B If you have a higher risk for hepatitis B, you should be screened for this virus. Talk with your health care provider to find out if you are at risk for hepatitis B infection. Hepatitis C Testing is recommended for:  Everyone born from 1945 through 1965.  Anyone with known risk factors for hepatitis C. Sexually transmitted infections (STIs)  Get screened for STIs, including gonorrhea and chlamydia, if: ? You are sexually active and are younger than 67 years of age. ? You are older than 67 years of age and your health care provider tells you that you are at risk for this type of infection. ? Your sexual activity has changed since you were last screened, and you are at increased risk for chlamydia or gonorrhea. Ask your health care provider if   you are at risk.  Ask your health care provider about whether you are at high risk for HIV. Your health care provider may recommend a prescription medicine to help prevent HIV infection. If you choose to take medicine to prevent HIV, you should first get tested for HIV. You should then be tested every 3 months for as long as you are taking the medicine. Pregnancy  If you are about to stop having your period (premenopausal) and  you may become pregnant, seek counseling before you get pregnant.  Take 400 to 800 micrograms (mcg) of folic acid every day if you become pregnant.  Ask for birth control (contraception) if you want to prevent pregnancy. Osteoporosis and menopause Osteoporosis is a disease in which the bones lose minerals and strength with aging. This can result in bone fractures. If you are 65 years old or older, or if you are at risk for osteoporosis and fractures, ask your health care provider if you should:  Be screened for bone loss.  Take a calcium or vitamin D supplement to lower your risk of fractures.  Be given hormone replacement therapy (HRT) to treat symptoms of menopause. Follow these instructions at home: Lifestyle  Do not use any products that contain nicotine or tobacco, such as cigarettes, e-cigarettes, and chewing tobacco. If you need help quitting, ask your health care provider.  Do not use street drugs.  Do not share needles.  Ask your health care provider for help if you need support or information about quitting drugs. Alcohol use  Do not drink alcohol if: ? Your health care provider tells you not to drink. ? You are pregnant, may be pregnant, or are planning to become pregnant.  If you drink alcohol: ? Limit how much you use to 0-1 drink a day. ? Limit intake if you are breastfeeding.  Be aware of how much alcohol is in your drink. In the [place] one drink equals one 12 oz bottle of beer (355 mL), one 5 oz glass of wine (148 mL), or one 1 oz glass of hard liquor (44 mL). General instructions  Schedule regular health, dental, and eye exams.  Stay current with your vaccines.  Tell your health care provider if: ? You often feel depressed. ? You have ever been abused or do not feel safe at home. Summary  Adopting a healthy lifestyle and getting preventive care are important in promoting health and wellness.  Follow your health care provider's instructions about healthy  diet, exercising, and getting tested or screened for diseases.  Follow your health care provider's instructions on monitoring your cholesterol and blood pressure. This information is not intended to replace advice given to you by your health care provider. Make sure you discuss any questions you have with your health care provider. Document Revised: 11/22/2018 Document Reviewed: 11/22/2018 Elsevier Patient Education  2020 Elsevier Inc.  

## 2020-12-03 ENCOUNTER — Encounter: Payer: Self-pay | Admitting: Nurse Practitioner

## 2020-12-03 ENCOUNTER — Ambulatory Visit (INDEPENDENT_AMBULATORY_CARE_PROVIDER_SITE_OTHER): Payer: Medicare HMO | Admitting: Nurse Practitioner

## 2020-12-03 ENCOUNTER — Other Ambulatory Visit: Payer: Self-pay

## 2020-12-03 ENCOUNTER — Other Ambulatory Visit (HOSPITAL_COMMUNITY)
Admission: RE | Admit: 2020-12-03 | Discharge: 2020-12-03 | Disposition: A | Discharge status: Home / Self Care | Payer: Medicare HMO | Source: Ambulatory Visit | Attending: Nurse Practitioner | Admitting: Nurse Practitioner

## 2020-12-03 ENCOUNTER — Ambulatory Visit: Payer: Medicare HMO

## 2020-12-03 VITALS — BP 126/82 | HR 90 | Temp 98.1°F | Ht 65.6 in | Wt 244.2 lb

## 2020-12-03 DIAGNOSIS — Z23 Encounter for immunization: Secondary | ICD-10-CM | POA: Diagnosis not present

## 2020-12-03 DIAGNOSIS — Z124 Encounter for screening for malignant neoplasm of cervix: Secondary | ICD-10-CM

## 2020-12-03 DIAGNOSIS — E559 Vitamin D deficiency, unspecified: Secondary | ICD-10-CM

## 2020-12-03 DIAGNOSIS — R300 Dysuria: Secondary | ICD-10-CM | POA: Diagnosis not present

## 2020-12-03 DIAGNOSIS — Z0001 Encounter for general adult medical examination with abnormal findings: Secondary | ICD-10-CM

## 2020-12-03 DIAGNOSIS — N898 Other specified noninflammatory disorders of vagina: Secondary | ICD-10-CM | POA: Insufficient documentation

## 2020-12-03 DIAGNOSIS — Z79899 Other long term (current) drug therapy: Secondary | ICD-10-CM

## 2020-12-03 DIAGNOSIS — R7303 Prediabetes: Secondary | ICD-10-CM

## 2020-12-03 DIAGNOSIS — D649 Anemia, unspecified: Secondary | ICD-10-CM | POA: Diagnosis not present

## 2020-12-03 DIAGNOSIS — I10 Essential (primary) hypertension: Secondary | ICD-10-CM | POA: Diagnosis not present

## 2020-12-03 DIAGNOSIS — Z0000 Encounter for general adult medical examination without abnormal findings: Secondary | ICD-10-CM

## 2020-12-03 LAB — POCT URINALYSIS DIPSTICK
Bilirubin, UA: NEGATIVE
Glucose, UA: NEGATIVE
Ketones, UA: NEGATIVE
Nitrite, UA: NEGATIVE
Protein, UA: POSITIVE — AB
Spec Grav, UA: 1.02 (ref 1.010–1.025)
Urobilinogen, UA: 0.2 E.U./dL
pH, UA: 7 (ref 5.0–8.0)

## 2020-12-03 LAB — POCT UA - MICROALBUMIN
Creatinine, POC: 200 mg/dL
Microalbumin Ur, POC: 150 mg/L

## 2020-12-03 MED ORDER — TETANUS-DIPHTH-ACELL PERTUSSIS 5-2.5-18.5 LF-MCG/0.5 IM SUSP: 0.5000 mL | Freq: Once | INTRAMUSCULAR | 0 refills | Status: AC

## 2020-12-04 ENCOUNTER — Telehealth: Payer: Medicare HMO

## 2020-12-04 LAB — CMP14+EGFR
ALT: 15 IU/L (ref 0–32)
AST: 20 IU/L (ref 0–40)
Albumin/Globulin Ratio: 1.4 (ref 1.2–2.2)
Albumin: 4.6 g/dL (ref 3.8–4.8)
Alkaline Phosphatase: 83 IU/L (ref 44–121)
BUN/Creatinine Ratio: 18 (ref 12–28)
BUN: 12 mg/dL (ref 8–27)
Bilirubin Total: 0.5 mg/dL (ref 0.0–1.2)
CO2: 28 mmol/L (ref 20–29)
Calcium: 10.2 mg/dL (ref 8.7–10.3)
Chloride: 95 mmol/L — ABNORMAL LOW (ref 96–106)
Creatinine, Ser: 0.67 mg/dL (ref 0.57–1.00)
GFR calc Af Amer: 105 mL/min/{1.73_m2} (ref >59)
GFR calc non Af Amer: 91 mL/min/{1.73_m2} (ref >59)
Globulin, Total: 3.2 g/dL (ref 1.5–4.5)
Glucose: 97 mg/dL (ref 65–99)
Potassium: 4.4 mmol/L (ref 3.5–5.2)
Sodium: 138 mmol/L (ref 134–144)
Total Protein: 7.8 g/dL (ref 6.0–8.5)

## 2020-12-04 LAB — CBC
Hematocrit: 44.2 % (ref 34.0–46.6)
Hemoglobin: 13.2 g/dL (ref 11.1–15.9)
MCH: 19.8 pg — ABNORMAL LOW (ref 26.6–33.0)
MCHC: 29.9 g/dL — ABNORMAL LOW (ref 31.5–35.7)
MCV: 67 fL — ABNORMAL LOW (ref 79–97)
Platelets: 338 10*3/uL (ref 150–450)
RBC: 6.65 x10E6/uL — ABNORMAL HIGH (ref 3.77–5.28)
RDW: 19.9 % — ABNORMAL HIGH (ref 11.7–15.4)
WBC: 5.8 10*3/uL (ref 3.4–10.8)

## 2020-12-04 LAB — HEMOGLOBIN A1C
Est. average glucose Bld gHb Est-mCnc: 117 mg/dL
Hgb A1c MFr Bld: 5.7 % — ABNORMAL HIGH (ref 4.8–5.6)

## 2020-12-05 LAB — URINE CULTURE

## 2020-12-08 LAB — CERVICOVAGINAL ANCILLARY ONLY
Bacterial Vaginitis (gardnerella): NEGATIVE
Candida Glabrata: NEGATIVE
Candida Vaginitis: NEGATIVE
Chlamydia: NEGATIVE
Comment: NEGATIVE
Comment: NEGATIVE
Comment: NEGATIVE
Comment: NEGATIVE
Comment: NEGATIVE
Comment: NORMAL
Neisseria Gonorrhea: NEGATIVE
Trichomonas: NEGATIVE

## 2020-12-09 LAB — CYTOLOGY - PAP
Adequacy: ABSENT
Diagnosis: NEGATIVE

## 2020-12-10 LAB — IRON AND TIBC
Iron Saturation: 28 % (ref 15–55)
Iron: 93 ug/dL (ref 27–139)
Total Iron Binding Capacity: 332 ug/dL (ref 250–450)
UIBC: 239 ug/dL (ref 118–369)

## 2020-12-10 LAB — SPECIMEN STATUS REPORT

## 2020-12-10 LAB — FERRITIN: Ferritin: 47 ng/mL (ref 15–150)

## 2020-12-17 ENCOUNTER — Encounter: Payer: Self-pay | Admitting: Nurse Practitioner

## 2020-12-17 DIAGNOSIS — H25093 Other age-related incipient cataract, bilateral: Secondary | ICD-10-CM | POA: Diagnosis not present

## 2020-12-17 DIAGNOSIS — E119 Type 2 diabetes mellitus without complications: Secondary | ICD-10-CM | POA: Diagnosis not present

## 2020-12-17 DIAGNOSIS — Z0100 Encounter for examination of eyes and vision without abnormal findings: Secondary | ICD-10-CM | POA: Diagnosis not present

## 2020-12-17 DIAGNOSIS — H52223 Regular astigmatism, bilateral: Secondary | ICD-10-CM | POA: Diagnosis not present

## 2020-12-17 DIAGNOSIS — H5203 Hypermetropia, bilateral: Secondary | ICD-10-CM | POA: Diagnosis not present

## 2020-12-17 DIAGNOSIS — H524 Presbyopia: Secondary | ICD-10-CM | POA: Diagnosis not present

## 2020-12-30 ENCOUNTER — Other Ambulatory Visit: Payer: Self-pay | Admitting: Nurse Practitioner

## 2020-12-30 DIAGNOSIS — G8929 Other chronic pain: Secondary | ICD-10-CM

## 2021-01-13 ENCOUNTER — Telehealth: Payer: Medicare HMO

## 2021-01-15 DIAGNOSIS — M25561 Pain in right knee: Secondary | ICD-10-CM | POA: Diagnosis not present

## 2021-01-26 ENCOUNTER — Other Ambulatory Visit: Payer: Self-pay | Admitting: Nurse Practitioner

## 2021-01-26 DIAGNOSIS — Z1231 Encounter for screening mammogram for malignant neoplasm of breast: Secondary | ICD-10-CM

## 2021-03-03 ENCOUNTER — Encounter: Payer: Self-pay | Admitting: Nurse Practitioner

## 2021-03-03 ENCOUNTER — Other Ambulatory Visit: Payer: Self-pay

## 2021-03-03 ENCOUNTER — Ambulatory Visit (INDEPENDENT_AMBULATORY_CARE_PROVIDER_SITE_OTHER): Payer: Medicare HMO | Admitting: Nurse Practitioner

## 2021-03-03 VITALS — BP 130/84 | HR 98 | Temp 97.9°F | Ht 65.6 in | Wt 245.0 lb

## 2021-03-03 DIAGNOSIS — G8929 Other chronic pain: Secondary | ICD-10-CM

## 2021-03-03 DIAGNOSIS — Z23 Encounter for immunization: Secondary | ICD-10-CM

## 2021-03-03 DIAGNOSIS — M25561 Pain in right knee: Secondary | ICD-10-CM | POA: Diagnosis not present

## 2021-03-03 DIAGNOSIS — R809 Proteinuria, unspecified: Secondary | ICD-10-CM

## 2021-03-03 DIAGNOSIS — R82998 Other abnormal findings in urine: Secondary | ICD-10-CM | POA: Diagnosis not present

## 2021-03-03 DIAGNOSIS — I10 Essential (primary) hypertension: Secondary | ICD-10-CM | POA: Diagnosis not present

## 2021-03-03 LAB — POCT URINALYSIS DIPSTICK
Bilirubin, UA: NEGATIVE
Blood, UA: NEGATIVE
Glucose, UA: NEGATIVE
Ketones, UA: NEGATIVE
Nitrite, UA: NEGATIVE
Protein, UA: NEGATIVE
Spec Grav, UA: 1.015 (ref 1.010–1.025)
Urobilinogen, UA: 0.2 E.U./dL
pH, UA: 5 (ref 5.0–8.0)

## 2021-03-03 MED ORDER — DICLOFENAC SODIUM 1 % EX GEL: 2.0000 g | Freq: Four times a day (QID) | CUTANEOUS | 2 refills | Status: AC

## 2021-03-03 MED ORDER — TETANUS-DIPHTH-ACELL PERTUSSIS 5-2.5-18.5 LF-MCG/0.5 IM SUSP: 0.5000 mL | Freq: Once | INTRAMUSCULAR | 0 refills | Status: AC

## 2021-03-03 NOTE — Progress Notes (Signed)
I,Yamilka Roman [employer] as a Education administrator for [employer], FNP.,have documented all relevant documentation on the behalf of Minette Brine, FNP,as directed by  Minette Brine, FNP while in the presence of Minette Brine, [place]. This visit occurred during the SARS-CoV-2 public health emergency.  Safety protocols were in place, including screening questions prior to the visit, additional usage of staff PPE, and extensive cleaning of exam room while observing appropriate contact time as indicated for disinfecting solutions.  Subjective:     Patient ID: Linda Mccoy , female    DOB: 15-Mar-1951 , 70 y.o.   MRN: [id number]   Chief Complaint  Patient presents with  . Hypertension    HPI  Linda Mccoy presents today for a follow up for her HTN. No concerns about her health. She has seen Dr. Gladstone Lighter for her right knee who gave her a steroid injection. He advised her it was arthritis.  She is due for her mammogram next month.   Hypertension This is a chronic problem. The current episode started more than 1 year ago. The problem has been gradually improving since onset. The problem is controlled. Pertinent negatives include no anxiety, chest pain, headaches or palpitations. There are no associated agents to hypertension. Risk factors for coronary artery disease include obesity. Past treatments include beta blockers, calcium channel blockers and diuretics. The current treatment provides moderate improvement. There is no history of angina or kidney disease. There is no history of chronic renal disease.     Past Medical History:  Diagnosis Date  . Anemia    History  . Borderline diabetes    no meds  . Depression    no meds  . Endometrial polyp 05/30/2014  . Fibroids   . Goiter   . History of blood transfusion    approx 20 yrs ago at [facility]  . Hypertension   . Multiple thyroid nodules 11/13/2018  . Obesity, unspecified 05/30/2014  . Postmenopausal bleeding 05/30/2014  . Prediabetes 02/12/2019  . Sleep  apnea    Does not use CPAP  . SVD (spontaneous vaginal delivery)    x 3  . Unspecified sleep apnea 05/30/2014  . Vitamin D deficiency 11/29/2018     Family History  Problem Relation Age of Onset  . Diabetes Mother   . Hypertension Mother   . Alcohol abuse Mother   . Breast cancer Neg Hx      Current Outpatient Medications:  .  aspirin EC 81 MG tablet, Take 81 mg by mouth daily., Disp: , Rfl:  .  Calcium-Magnesium-Zinc 333-133-5 MG TABS, Take 1 tablet by mouth daily., Disp: , Rfl:  .  Cholecalciferol (VITAMIN D) 2000 UNITS CAPS, Take 2,000 Units by mouth daily., Disp: , Rfl:  .  diclofenac Sodium (VOLTAREN) 1 % GEL, Apply 2 g topically 4 (four) times daily., Disp: 100 g, Rfl: 2 .  lisinopril-hydrochlorothiazide (ZESTORETIC) 20-25 MG tablet, Take 1 tablet by mouth daily., Disp: 90 tablet, Rfl: 1 .  Tdap (BOOSTRIX) 5-2.5-18.5 LF-MCG/0.5 injection, Inject 0.5 mLs into the muscle once for 1 dose., Disp: 0.5 mL, Rfl: 0 .  metoprolol tartrate (LOPRESSOR) 25 MG tablet, Take 1 tablet (25 mg total) by mouth 2 (two) times daily. (Patient not taking: No sig reported), Disp: 180 tablet, Rfl: 2   Allergies  Allergen Reactions  . Penicillins Hives     Review of Systems  Constitutional: Negative.   HENT: Negative.   Eyes: Negative.   Respiratory: Negative.   Cardiovascular: Negative.  Negative for chest pain  and palpitations.  Gastrointestinal: Negative.   Endocrine: Negative.   Genitourinary: Negative.   Musculoskeletal: Negative.   Skin: Negative.   Allergic/Immunologic: Negative.   Neurological: Negative.  Negative for headaches.  Hematological: Negative.   Psychiatric/Behavioral: Negative.      Today's Vitals   03/03/21 0838  BP: 130/84  Pulse: 98  Temp: 97.9 F (36.6 C)  TempSrc: Oral  Weight: 245 lb (111.1 kg)  Height: 5' 5.6" (1.666 m)  PainSc: 0-No pain   Body mass index is 40.03 kg/m.   Objective:  Physical Exam Constitutional:      General: She is not in  acute distress.    Appearance: Normal appearance. She is obese.  Cardiovascular:     Rate and Rhythm: Normal rate and regular rhythm.     Pulses: Normal pulses.     Heart sounds: Normal heart sounds. No murmur heard.   Pulmonary:     Effort: Pulmonary effort is normal. No respiratory distress.     Breath sounds: Normal breath sounds. No wheezing.  Abdominal:     General: Abdomen is flat. Bowel sounds are normal.     Palpations: Abdomen is soft.  Musculoskeletal:        General: Normal range of motion.     Cervical back: Normal range of motion and neck supple.  Skin:    General: Skin is warm and dry.     Capillary Refill: Capillary refill takes less than 2 seconds.  Neurological:     General: No focal deficit present.     Mental Status: She is alert.  Psychiatric:        Mood and Affect: Mood normal.        Behavior: Behavior normal.        Thought Content: Thought content normal.        Judgment: Judgment normal.         Assessment And Plan:     1. Hypertension, unspecified type . B/P is fairly controlled.  . CMP ordered to check renal function.  . The importance of regular exercise and dietary modification was stressed to the patient.   2. Chronic pain of right knee  She has seen Dr. Gladstone Lighter who administered injections to her knee which was effective but has not been taking any other pain medication.   She is encouraged to use the voltaren or diclofenac gel since her pain is better controlled - diclofenac Sodium (VOLTAREN) 1 % GEL; Apply 2 g topically 4 (four) times daily.  Dispense: 100 g; Refill: 2  3. Encounter for immunization  Rx sent to pharmacy for TDAP which should be administered to adults 19-45 years old every 10 years.  Rx for prevnar 20 also sent to pharmacy encouraged to have done prior to next visit.  - Pneumococcal conjugate vaccine 20-valent - Tdap (BOOSTRIX) 5-2.5-18.5 LF-MCG/0.5 injection; Inject 0.5 mLs into the muscle once for 1 dose.   Dispense: 0.5 mL; Refill: 0     Patient was given opportunity to ask questions. Patient verbalized understanding of the plan and was able to repeat key elements of the plan. All questions were answered to their satisfaction.  Minette Brine, FNP   I, Minette Brine, FNP, have reviewed all documentation for this visit. The documentation on 03/03/21 for the exam, diagnosis, procedures, and orders are all accurate and complete.   IF YOU HAVE BEEN REFERRED TO A SPECIALIST, IT MAY TAKE 1-2 WEEKS TO SCHEDULE/PROCESS THE REFERRAL. IF YOU HAVE NOT HEARD FROM US/SPECIALIST IN TWO  WEEKS, PLEASE GIVE [place] A CALL AT [phone] X [phone].   THE PATIENT IS ENCOURAGED TO PRACTICE SOCIAL DISTANCING DUE TO THE COVID-19 PANDEMIC.

## 2021-03-03 NOTE — Patient Instructions (Signed)

## 2021-03-05 ENCOUNTER — Telehealth: Payer: Medicare HMO

## 2021-03-06 LAB — URINE CULTURE

## 2021-03-07 ENCOUNTER — Other Ambulatory Visit: Payer: Self-pay | Admitting: Nurse Practitioner

## 2021-03-07 DIAGNOSIS — N390 Urinary tract infection, site not specified: Secondary | ICD-10-CM

## 2021-03-07 MED ORDER — NITROFURANTOIN MONOHYD MACRO 100 MG PO CAPS: 100.0000 mg | ORAL_CAPSULE | Freq: Two times a day (BID) | ORAL | 0 refills | Status: AC

## 2021-03-11 MED ORDER — PREVNAR 20 0.5 ML IM SUSY: 0.5000 mL | PREFILLED_SYRINGE | INTRAMUSCULAR | 0 refills | Status: AC

## 2021-03-17 ENCOUNTER — Other Ambulatory Visit: Payer: Self-pay

## 2021-03-17 ENCOUNTER — Ambulatory Visit
Admission: RE | Admit: 2021-03-17 | Discharge: 2021-03-17 | Disposition: A | Discharge status: Home / Self Care | Payer: Medicare HMO | Source: Ambulatory Visit | Attending: Nurse Practitioner | Admitting: Nurse Practitioner

## 2021-03-17 DIAGNOSIS — Z1231 Encounter for screening mammogram for malignant neoplasm of breast: Secondary | ICD-10-CM

## 2021-03-18 ENCOUNTER — Other Ambulatory Visit: Payer: Self-pay | Admitting: Nurse Practitioner

## 2021-03-18 DIAGNOSIS — I10 Essential (primary) hypertension: Secondary | ICD-10-CM

## 2021-04-09 ENCOUNTER — Ambulatory Visit (INDEPENDENT_AMBULATORY_CARE_PROVIDER_SITE_OTHER): Payer: Medicare HMO

## 2021-04-09 ENCOUNTER — Telehealth: Payer: Medicare HMO

## 2021-04-09 DIAGNOSIS — I10 Essential (primary) hypertension: Secondary | ICD-10-CM

## 2021-04-09 DIAGNOSIS — E669 Obesity, unspecified: Secondary | ICD-10-CM

## 2021-04-09 DIAGNOSIS — M25561 Pain in right knee: Secondary | ICD-10-CM

## 2021-04-09 DIAGNOSIS — R7303 Prediabetes: Secondary | ICD-10-CM

## 2021-04-09 DIAGNOSIS — G8929 Other chronic pain: Secondary | ICD-10-CM

## 2021-04-21 NOTE — Chronic Care Management (AMB) (Signed)
Chronic Care Management   CCM RN Visit Note  04/09/2021 Name: Linda Mccoy MRN: [id number] DOB: 05-03-1951  Subjective: Linda Mccoy is a 70 y.o. year old female who is a primary care patient of Minette Brine, [place]. The care management team was consulted for assistance with disease management and care coordination needs.    Engaged with patient by telephone for follow up visit in response to provider referral for case management and/or care coordination services.   Consent to Services:  The patient was given information about Chronic Care Management services, agreed to services, and gave verbal consent prior to initiation of services.  Please see initial visit note for detailed documentation.   Patient agreed to services and verbal consent obtained.   Assessment: Review of patient past medical history, allergies, medications, health status, including review of consultants reports, laboratory and other test data, was performed as part of comprehensive evaluation and provision of chronic care management services.   SDOH (Social Determinants of Health) assessments and interventions performed:  Yes, no acute challenges   CCM Care Plan  Allergies  Allergen Reactions  . Penicillins Hives    Outpatient Encounter Medications as of 04/09/2021  Medication Sig  . aspirin EC 81 MG tablet Take 81 mg by mouth daily.  . Calcium-Magnesium-Zinc 333-133-5 MG TABS Take 1 tablet by mouth daily.  . Cholecalciferol (VITAMIN D) 2000 UNITS CAPS Take 2,000 Units by mouth daily.  . diclofenac Sodium (VOLTAREN) 1 % GEL Apply 2 g topically 4 (four) times daily.  Marland Kitchen lisinopril-hydrochlorothiazide (ZESTORETIC) 20-25 MG tablet TAKE 1 TABLET EVERY DAY  . metoprolol tartrate (LOPRESSOR) 25 MG tablet Take 1 tablet (25 mg total) by mouth 2 (two) times daily. (Patient not taking: No sig reported)   No facility-administered encounter medications on file as of 04/09/2021.    Patient Active Problem List    Diagnosis Date Noted  . Palpitations 02/26/2020  . Prediabetes 02/12/2019  . Vitamin D deficiency 11/29/2018  . Multiple thyroid nodules 11/13/2018  . Postmenopausal bleeding 05/30/2014  . Anemia 05/30/2014  . Obesity, unspecified 05/30/2014  . Unspecified sleep apnea 05/30/2014  . Endometrial polyp 05/30/2014  . Hypertension 02/05/2013    Conditions to be addressed/monitored:HTN, Obesity, Prediabetes, Chronic pain   Care Plan : Chronic Pain (Adult)  Updates made by Lynne Logan, RN since 04/21/2021 12:00 AM    Problem: Chronic Pain Management (Chronic Pain)   Priority: High    Long-Range Goal: Chronic Pain Managed   Start Date: 04/09/2021  Expected End Date: 11/09/2021  This Visit's Progress: On track  Priority: High  Note:   Current Barriers:   Ineffective Self Health Maintenance  Clinical Goal(s):  Marland Kitchen Collaboration with Minette Brine, FNP regarding development and update of comprehensive plan of care as evidenced by provider attestation and co-signature . Inter-disciplinary care team collaboration (see longitudinal plan of care)  patient will work with care management team to address care coordination and chronic disease management needs related to Disease Management  Educational Needs  Care Coordination  Medication Management and Education  Psychosocial Support   Interventions:  04/09/21 completed successful outbound call with patient   Evaluation of current treatment plan related to  Chronic pain to right knee  , self-management and patient's adherence to plan as established by provider.  Collaboration with Minette Brine, FNP regarding development and update of comprehensive plan of care as evidenced by provider attestation       and co-signature  Inter-disciplinary care team collaboration (see longitudinal plan  of care)  Assessed pain level, treatment efficacy and patient response at regular intervals using a consistent pain scale.   Assessed if pain is  associated with mobility or at rest, location, intensity, frequency, duration, recurrence, pattern and description (e.g., cramping, burning, aching), triggers and relieving factors.    Reviewed and discussed recent referral for Orthopedic consultation   Educated patient on nonpharmacologic measures, such as cognitive behavior therapy, mindfulness, guided imagery, massage, distraction, relaxation, chiropractic manipulation, dietary supplements or acupuncture.   Provided multimodal treatment interventions, such as physical activity, therapeutic exercise, yoga, TENS (transcutaneous electrical nerve stimulation) and manual therapy.   Educated on fall prevention measures; Assessed for DME needs  Discussed plans with patient for ongoing care management follow up and provided patient with direct contact information for care management team Self Care Activities:  . Continue to keep all scheduled follow up appointments . Take medications as directed  . Let your healthcare team know if you are unable to take your medications . Call your pharmacy for refills at least 7 days prior to running out of medication . Use caution when walking and wear good supportive shoes; Avoid walking on uneven ground and use DME as needed to help with balance/gait . Follow up with Orthopedics for evaluation and treatment of chronic right knee pain  Patient Goals: - to continue to have mobility and be independent  Follow Up Plan: Telephone follow up appointment with care management team member scheduled for: 06/16/21   Plan:Telephone follow up appointment with care management team member scheduled for:  06/16/21  Barb Merino, RN, BSN, CCM Care Management Coordinator [facility]  Direct Phone: [phone]

## 2021-04-21 NOTE — Patient Instructions (Signed)
Goals Addressed      Patient Stated   .  COMPLETED: "I don't now if I have diabetes" (pt-stated)        Current Barriers:  Marland Kitchen Knowledge Deficits related to disease process and Self Health Management of Pre-diabetes . Chronic Disease Management support and education needs related to Pre-diabetes, HTN, Obesity,   Nurse Case Manager Clinical Goal(s):  . 07/25/20 New Over the next 90 days, patient will work with the CCM team and PCP to address needs related to disease education and support for improved Self Health management of Diabetes  CCM RN CM Interventions:  04/09/21 completed call with patient  . Evaluation of current treatment plan related to Pre-diabetes and patient's adherence to plan as established by provider. . Provided education to patient re: current A1c of 5.7 %; Educated patient this is prediabetic range; Educated on dietary and recommendations per ADA . Educated on dietary and exercise recommendations to help lower A1c and prevent type 2 Diabetes . Discussed plans with patient for ongoing care management follow up and provided patient with direct contact information for care management team  Patient Self Care Activities:  . Patient will self administer medications as prescribed . Patient will attend all scheduled provider appointments . Patient will call pharmacy for medication refills . Patient will call provider office for new concerns or questions  Please see past updates related to this goal by clicking on the "Past Updates" button in the selected goal      .  COMPLETED: "I need to learn how to eat better" (pt-stated)        Current Barriers:  Marland Kitchen Knowledge Deficits related to how and what to eat to help improve nutrition, hydration and weight loss  . Chronic Disease Management support and education needs related to Obesity, HTN, Pre-diabetes  Nurse Case Manager Clinical Goal(s):  . 07/25/20 New Over the next 90 days, patient will meet with RN Care Manager to address  disease education and support to increase knowledge and understanding of how to make healthy food choices AEB by patient will lower her BMI and promote weight loss    CCM RN CM Interventions:  04/09/21 completed call with patient  . Evaluation of current treatment plan related to Obesity and patient's adherence to plan as established by provider. . Reinforced to patient re: the importance of establishing short term realistic weight loss goals to help be successful as she implements healthier food choices for daily consumption; encouraged to keep a food journal and slowly substitute unhealthy foods for healthy foods one day or one week at a time;  . Provided active listening and support to patient regarding her desire to "eat healthier" . Discussed plans with patient for ongoing care management follow up and provided patient with direct contact information for care management team .  Patient Self Care Activities:  . Patient will self administer medications as prescribed . Patient will attend all scheduled provider appointments . Patient will call pharmacy for medication refills . Patient will call provider office for new concerns or questions  Please see past updates related to this goal by clicking on the "Past Updates" button in the selected goal        Other   .  Manage Chronic Pain   On track     Timeframe:  Long-Range Goal Priority:  High Start Date: 04/09/21  Expected End Date: 10/09/21                     Follow Up Date: 06/16/21   Self Care Activities:  . Continue to keep all scheduled follow up appointments . Take medications as directed  . Let your healthcare team know if you are unable to take your medications . Call your pharmacy for refills at least 7 days prior to running out of medication . Use caution when walking and wear good supportive shoes; Avoid walking on uneven ground and use DME as needed to help with balance/gait . Follow up with  Orthopedics for evaluation and treatment of chronic right knee pain  Patient Goals: - to continue to have mobility and be independent   Why is this important?    Day-to-day life can be hard when you have chronic pain.   Pain medicine is just one piece of the treatment puzzle.   You can try these action steps to help you manage your pain.    Notes:

## 2021-06-11 ENCOUNTER — Other Ambulatory Visit: Payer: Self-pay

## 2021-06-11 ENCOUNTER — Encounter: Payer: Self-pay | Admitting: Nurse Practitioner

## 2021-06-11 ENCOUNTER — Ambulatory Visit (INDEPENDENT_AMBULATORY_CARE_PROVIDER_SITE_OTHER): Payer: Medicare HMO | Admitting: Nurse Practitioner

## 2021-06-11 ENCOUNTER — Ambulatory Visit (INDEPENDENT_AMBULATORY_CARE_PROVIDER_SITE_OTHER): Payer: Medicare HMO

## 2021-06-11 VITALS — BP 128/80 | HR 84 | Temp 98.4°F | Ht 64.6 in | Wt 243.8 lb

## 2021-06-11 DIAGNOSIS — Z6841 Body Mass Index (BMI) 40.0 and over, adult: Secondary | ICD-10-CM | POA: Diagnosis not present

## 2021-06-11 DIAGNOSIS — Z0000 Encounter for general adult medical examination without abnormal findings: Secondary | ICD-10-CM | POA: Diagnosis not present

## 2021-06-11 DIAGNOSIS — R7303 Prediabetes: Secondary | ICD-10-CM | POA: Diagnosis not present

## 2021-06-11 DIAGNOSIS — Z23 Encounter for immunization: Secondary | ICD-10-CM

## 2021-06-11 DIAGNOSIS — E559 Vitamin D deficiency, unspecified: Secondary | ICD-10-CM | POA: Diagnosis not present

## 2021-06-11 DIAGNOSIS — I10 Essential (primary) hypertension: Secondary | ICD-10-CM | POA: Diagnosis not present

## 2021-06-11 DIAGNOSIS — E662 Morbid (severe) obesity with alveolar hypoventilation: Secondary | ICD-10-CM

## 2021-06-11 LAB — POCT UA - MICROALBUMIN
Albumin/Creatinine Ratio, Urine, POC: 30
Creatinine, POC: 100 mg/dL
Microalbumin Ur, POC: 10 mg/L

## 2021-06-11 MED ORDER — BOOSTRIX 5-2.5-18.5 LF-MCG/0.5 IM SUSP: 0.5000 mL | Freq: Once | INTRAMUSCULAR | 0 refills | Status: AC

## 2021-06-11 NOTE — Patient Instructions (Signed)

## 2021-06-11 NOTE — Progress Notes (Signed)
This visit occurred during the SARS-CoV-2 public health emergency.  Safety protocols were in place, including screening questions prior to the visit, additional usage of staff PPE, and extensive cleaning of exam room while observing appropriate contact time as indicated for disinfecting solutions.  Subjective:   Linda Mccoy is a 70 y.o. female who presents for Medicare Annual (Subsequent) preventive examination.  Review of Systems     Cardiac Risk Factors include: advanced age (>88men, >61 women);hypertension;obesity (BMI >30kg/m2);sedentary lifestyle     Objective:    Today's Vitals   06/11/21 1027  BP: 128/80  Pulse: 84  Temp: 98.4 F (36.9 C)  TempSrc: Oral  SpO2: 95%  Weight: 243 lb 12.8 oz (110.6 kg)  Height: 5' 4.6" (1.641 m)   Body mass index is 41.07 kg/m.  Advanced Directives 06/11/2021 06/04/2020 10/30/2019 11/13/2018 05/23/2014  Does Patient Have a Medical Advance Directive? No No No Yes Patient does not have advance directive;Patient would not like information  Does patient want to make changes to medical advance directive? - - - Yes (MAU/Ambulatory/Procedural Areas - Information given) -  Would patient like information on creating a medical advance directive? - No - Patient declined Yes (MAU/Ambulatory/Procedural Areas - Information given) - -  Pre-existing out of facility DNR order (yellow form or pink MOST form) - - - - No    Current Medications (verified) Outpatient Encounter Medications as of 06/11/2021  Medication Sig   aspirin EC 81 MG tablet Take 81 mg by mouth daily.   Calcium-Magnesium-Zinc 333-133-5 MG TABS Take 1 tablet by mouth daily.   Cholecalciferol (VITAMIN D) 2000 UNITS CAPS Take 2,000 Units by mouth daily.   diclofenac Sodium (VOLTAREN) 1 % GEL Apply 2 g topically 4 (four) times daily.   lisinopril-hydrochlorothiazide (ZESTORETIC) 20-25 MG tablet TAKE 1 TABLET EVERY DAY   Tdap (BOOSTRIX) 5-2.5-18.5 LF-MCG/0.5 injection Inject 0.5 mLs into  the muscle once for 1 dose.   metoprolol tartrate (LOPRESSOR) 25 MG tablet Take 1 tablet (25 mg total) by mouth 2 (two) times daily. (Patient not taking: No sig reported)   No facility-administered encounter medications on file as of 06/11/2021.    Allergies (verified) Penicillins   History: Past Medical History:  Diagnosis Date   Anemia    History   Borderline diabetes    no meds   Depression    no meds   Endometrial polyp 05/30/2014   Fibroids    Goiter    History of blood transfusion    approx 20 yrs ago at [facility]   Hypertension    Multiple thyroid nodules 11/13/2018   Obesity, unspecified 05/30/2014   Postmenopausal bleeding 05/30/2014   Prediabetes 02/12/2019   Sleep apnea    Does not use CPAP   SVD (spontaneous vaginal delivery)    x 3   Unspecified sleep apnea 05/30/2014   Vitamin D deficiency 11/29/2018   Past Surgical History:  Procedure Laterality Date   COLONOSCOPY     DILATATION & CURRETTAGE/HYSTEROSCOPY WITH RESECTOCOPE N/A 05/30/2014   Procedure: DILATATION & CURETTAGE/HYSTEROSCOPY WITH RESECTion of endometrial polyp;  Surgeon: Ena Dawley, MD;  Location: [facility];  Service: Gynecology;  Laterality: N/A;   TUBAL LIGATION     Family History  Problem Relation Age of Onset   Diabetes Mother    Hypertension Mother    Alcohol abuse Mother    Breast cancer Neg Hx    Social History   Socioeconomic History   Marital status: Widowed    Spouse name: Not on  file   Number of children: Not on file   Years of education: Not on file   Highest education level: Not on file  Occupational History   Occupation: retired  Tobacco Use   Smoking status: Never   Smokeless tobacco: Never  Vaping Use   Vaping Use: Never used  Substance and Sexual Activity   Alcohol use: Not Currently   Drug use: No   Sexual activity: Not Currently    Partners: Male    Birth control/protection: Post-menopausal, Surgical    Comment: BTL   Other Topics Concern   Not on file  Social  History Narrative   Not on file   Social Determinants of Health   Financial Resource Strain: Low Risk    Difficulty of Paying Living Expenses: Not hard at all  Food Insecurity: No Food Insecurity   Worried About Charity fundraiser in the Last Year: Never true   [place] in the Last Year: Never true  Transportation Needs: No Transportation Needs   Lack of Transportation (Medical): No   Lack of Transportation (Non-Medical): No  Physical Activity: Inactive   Days of Exercise per Week: 0 days   Minutes of Exercise per Session: 0 min  Stress: No Stress Concern Present   Feeling of Stress : Not at all  Social Connections: Not on file    Tobacco Counseling Counseling given: Not Answered   Clinical Intake:  Pre-visit preparation completed: Yes  Pain : No/denies pain     Nutritional Status: BMI > 30  Obese Nutritional Risks: None Diabetes: No  How often do you need to have someone help you when you read instructions, pamphlets, or other written materials from your doctor or pharmacy?: 1 - Never What is the last grade level you completed in school?: 12th grade  Diabetic? no  Interpreter Needed?: No  Information entered by :: NAllen LPN   Activities of Daily Living In your present state of health, do you have any difficulty performing the following activities: 06/11/2021  Hearing? N  Vision? N  Difficulty concentrating or making decisions? Y  Walking or climbing stairs? N  Dressing or bathing? N  Doing errands, shopping? N  Preparing Food and eating ? N  Using the Toilet? N  In the past six months, have you accidently leaked urine? N  Do you have problems with loss of bowel control? N  Managing your Medications? N  Managing your Finances? N  Housekeeping or managing your Housekeeping? N  Some recent data might be hidden    Patient Care Team: Minette Brine, FNP as PCP - General ([place]) Jettie Booze, MD as PCP - Cardiology  (Cardiology) Rex Kras, Claudette Stapler, RN as Case Manager  Indicate any recent Medical Services you may have received from other than [facility] providers in the past year (date may be approximate).     Assessment:   This is a routine wellness examination for [name].  Hearing/Vision screen Vision Screening - Comments:: Regular eye exams, Dr. Nadara Mustard  Dietary issues and exercise activities discussed: Current Exercise Habits: The patient does not participate in regular exercise at present   Goals Addressed             This Visit's Progress    Patient Stated       06/11/2021, wants to get to 200 pounds        Depression Screen PHQ 2/9 Scores 06/11/2021 06/04/2020 11/28/2019 10/30/2019 11/13/2018  PHQ - 2 Score 0 1 0  2 0  PHQ- 9 Score - - - 3 -    Fall Risk Fall Risk  06/11/2021 06/04/2020 11/28/2019 10/30/2019 11/13/2018  Falls in the past year? 0 0 0 0 0  Risk for fall due to : Medication side effect Medication side effect - Medication side effect -  Follow up Falls evaluation completed;Education provided;Falls prevention discussed Falls evaluation completed;Education provided;Falls prevention discussed - Falls evaluation completed;Education provided;Falls prevention discussed -    FALL RISK PREVENTION PERTAINING TO THE HOME:  Any stairs in or around the home? No  If so, are there any without handrails? N/a Home free of loose throw rugs in walkways, pet beds, electrical cords, etc? Yes  Adequate lighting in your home to reduce risk of falls? Yes   ASSISTIVE DEVICES UTILIZED TO PREVENT FALLS:  Life alert? No  Use of a cane, walker or w/c? No  Grab bars in the bathroom? No  Shower chair or bench in shower? No  Elevated toilet seat or a handicapped toilet? Yes   TIMED UP AND GO:  Was the test performed? No .     Gait slow and steady without use of assistive device  Cognitive Function:     6CIT Screen 06/11/2021 06/04/2020 10/30/2019  What Year? 0 points 0 points 0 points  What  month? 0 points 0 points 0 points  What time? 0 points 0 points 0 points  Count back from 20 0 points 0 points 0 points  Months in reverse 0 points 0 points 0 points  Repeat phrase 2 points 0 points 0 points  Total Score 2 0 0    Immunizations Immunization History  Administered Date(s) Administered   Influenza, High Dose Seasonal PF 11/13/2018   PFIZER(Purple Top)SARS-COV-2 Vaccination 01/25/2020, 02/19/2020, 10/07/2020, 04/16/2021    TDAP status: Due, Education has been provided regarding the importance of this vaccine. Advised may receive this vaccine at local pharmacy or Health Dept. Aware to provide a copy of the vaccination record if obtained from local pharmacy or Health Dept. Verbalized acceptance and understanding.  Flu Vaccine status: Up to date  Pneumococcal vaccine status: Declined,  Education has been provided regarding the importance of this vaccine but patient still declined. Advised may receive this vaccine at local pharmacy or Health Dept. Aware to provide a copy of the vaccination record if obtained from local pharmacy or Health Dept. Verbalized acceptance and understanding.   Covid-19 vaccine status: Completed vaccines  Qualifies for Shingles Vaccine? Yes   Zostavax completed No   Shingrix Completed?: No.    Education has been provided regarding the importance of this vaccine. Patient has been advised to call insurance company to determine out of pocket expense if they have not yet received this vaccine. Advised may also receive vaccine at local pharmacy or Health Dept. Verbalized acceptance and understanding.  Screening Tests Health Maintenance  Topic Date Due   TETANUS/TDAP  09/08/2020   Zoster Vaccines- Shingrix (1 of 2) 09/11/2021 (Originally 10/18/1970)   PNA vac Low Risk Adult (1 of 2 - PCV13) 06/11/2022 (Originally 10/18/2018)   INFLUENZA VACCINE  07/13/2021   COVID-19 Vaccine (5 - Booster for [place] series) 08/17/2021   MAMMOGRAM  03/18/2023   COLONOSCOPY  (Pts 45-37yrs Insurance coverage will need to be confirmed)  08/30/2029   DEXA SCAN  Completed   Hepatitis C Screening  Completed   HPV VACCINES  Aged Out    Health Maintenance  Health Maintenance Due  Topic Date Due   TETANUS/TDAP  09/08/2020  Colorectal cancer screening: Type of screening: Colonoscopy. Completed 08/31/2019. Repeat every 10 years  Mammogram status: Completed 03/17/2021. Repeat every year  Bone Density status: Completed 12/08/2017.   Lung Cancer Screening: (Low Dose CT Chest recommended if Age 54-80 years, 30 pack-year currently smoking OR have quit w/in 15years.) does not qualify.   Lung Cancer Screening Referral: no  Additional Screening:  Hepatitis C Screening: does qualify; Completed 11/13/2018  Vision Screening: Recommended annual ophthalmology exams for early detection of glaucoma and other disorders of the eye. Is the patient up to date with their annual eye exam?  Yes  Who is the provider or what is the name of the office in which the patient attends annual eye exams? Dr. Nadara Mustard If pt is not established with a provider, would they like to be referred to a provider to establish care? No .   Dental Screening: Recommended annual dental exams for proper oral hygiene  Community Resource Referral / Chronic Care Management: CRR required this visit?  No   CCM required this visit?  No      Plan:     I have personally reviewed and noted the following in the patient's chart:   Medical and social history Use of alcohol, tobacco or illicit drugs  Current medications and supplements including opioid prescriptions.  Functional ability and status Nutritional status Physical activity Advanced directives List of other physicians Hospitalizations, surgeries, and ER visits in previous 12 months Vitals Screenings to include cognitive, depression, and falls Referrals and appointments  In addition, I have reviewed and discussed with patient certain  preventive protocols, quality metrics, and best practice recommendations. A written personalized care plan for preventive services as well as general preventive health recommendations were provided to patient.     Kellie Simmering, LPN   [id number]   Nurse Notes:

## 2021-06-11 NOTE — Progress Notes (Signed)
I,[employer] as a Education administrator for [employer], FNP.,have documented all relevant documentation on the behalf of Linda Brine, FNP,as directed by  Linda Brine, FNP while in the presence of Linda Mccoy, [place].  This visit occurred during the SARS-CoV-2 public health emergency.  Safety protocols were in place, including screening questions prior to the visit, additional usage of staff PPE, and extensive cleaning of exam room while observing appropriate contact time as indicated for disinfecting solutions.  Subjective:     Patient ID: Linda Mccoy , female    DOB: [birth year] , 70 y.o.   MRN: [id number]   Chief Complaint  Patient presents with   Hypertension    HPI  Linda Mccoy presents today for a follow up for her HTN.   Wt Readings from Last 3 Encounters: 06/11/21 : 243 lb 13.3 oz (110.6 kg) 06/11/21 : 243 lb 12.8 oz (110.6 kg) 03/03/21 : 245 lb (111.1 kg)    Hypertension This is a chronic problem. The current episode started more than 1 year ago. The problem has been gradually improving since onset. The problem is controlled. Pertinent negatives include no anxiety, chest pain, headaches or palpitations. There are no associated agents to hypertension. Risk factors for coronary artery disease include obesity. Past treatments include beta blockers, calcium channel blockers and diuretics. The current treatment provides moderate improvement. There is no history of angina or kidney disease. There is no history of chronic renal disease.    Past Medical History:  Diagnosis Date   Anemia    History   Borderline diabetes    no meds   Depression    no meds   Endometrial polyp 05/30/2014   Fibroids    Goiter    History of blood transfusion    approx 20 yrs ago at [facility]   Hypertension    Multiple thyroid nodules 11/13/2018   Obesity, unspecified 05/30/2014   Postmenopausal bleeding 05/30/2014   Prediabetes 02/12/2019   Sleep apnea    Does not use CPAP   SVD (spontaneous  vaginal delivery)    x 3   Unspecified sleep apnea 05/30/2014   Vitamin D deficiency 11/29/2018     Family History  Problem Relation Age of Onset   Diabetes Mother    Hypertension Mother    Alcohol abuse Mother    Breast cancer Neg Hx      Current Outpatient Medications:    aspirin EC 81 MG tablet, Take 81 mg by mouth daily., Disp: , Rfl:    Calcium-Magnesium-Zinc 333-133-5 MG TABS, Take 1 tablet by mouth daily., Disp: , Rfl:    Cholecalciferol (VITAMIN D) 2000 UNITS CAPS, Take 2,000 Units by mouth daily., Disp: , Rfl:    diclofenac Sodium (VOLTAREN) 1 % GEL, Apply 2 g topically 4 (four) times daily., Disp: 100 g, Rfl: 2   lisinopril-hydrochlorothiazide (ZESTORETIC) 20-25 MG tablet, TAKE 1 TABLET EVERY DAY, Disp: 90 tablet, Rfl: 1   metoprolol tartrate (LOPRESSOR) 25 MG tablet, Take 1 tablet (25 mg total) by mouth 2 (two) times daily. (Patient not taking: No sig reported), Disp: 180 tablet, Rfl: 2   Allergies  Allergen Reactions   Penicillins Hives     Review of Systems  Constitutional: Negative.   HENT: Negative.    Eyes: Negative.   Respiratory: Negative.    Cardiovascular: Negative.  Negative for chest pain, palpitations and leg swelling.  Gastrointestinal: Negative.        Reports feeling a heaviness at times after urinating.   Endocrine:  Negative.   Genitourinary: Negative.   Musculoskeletal: Negative.   Skin: Negative.   Allergic/Immunologic: Negative.   Neurological: Negative.  Negative for headaches.  Hematological: Negative.   Psychiatric/Behavioral: Negative.      Today's Vitals   06/11/21 1052  BP: 128/80  Pulse: 84  Temp: 98.4 F (36.9 C)  Weight: 243 lb 13.3 oz (110.6 kg)  Height: 5' 4.6" (1.641 m)  PainSc: 0-No pain   Body mass index is 41.08 kg/m.   Objective:  Physical Exam Constitutional:      General: She is not in acute distress.    Appearance: Normal appearance. She is obese.  Cardiovascular:     Rate and Rhythm: Normal rate and  regular rhythm.     Pulses: Normal pulses.     Heart sounds: Normal heart sounds. No murmur heard. Pulmonary:     Effort: Pulmonary effort is normal. No respiratory distress.     Breath sounds: Normal breath sounds. No wheezing.  Abdominal:     General: Abdomen is flat. Bowel sounds are normal.     Palpations: Abdomen is soft.  Musculoskeletal:        General: Normal range of motion.     Cervical back: Normal range of motion and neck supple.  Skin:    General: Skin is warm and dry.     Capillary Refill: Capillary refill takes less than 2 seconds.  Neurological:     General: No focal deficit present.     Mental Status: She is alert.  Psychiatric:        Mood and Affect: Mood normal.        Behavior: Behavior normal.        Thought Content: Thought content normal.        Judgment: Judgment normal.        Assessment And Plan:     1. Hypertension, unspecified type B/P is controlled.  CMP ordered to check renal function.  The importance of regular exercise and dietary modification was stressed to the patient.  - CMP14+EGFR  2. Prediabetes Chronic, no current medications - CMP14+EGFR - Hemoglobin A1c - POCT UA - Microalbumin  3. Vitamin D deficiency Will check vitamin D level and supplement as needed.    Also encouraged to spend 15 minutes in the sun daily.   4. Class 3 obesity with alveolar hypoventilation and body mass index (BMI) of 40.0 to 44.9 in adult, unspecified whether serious comorbidity present (HCC) Chronic Discussed healthy diet and regular exercise options  Encouraged to exercise at least 150 minutes per week with 2 days of strength training as tolerated   Patient was given opportunity to ask questions. Patient verbalized understanding of the plan and was able to repeat key elements of the plan. All questions were answered to their satisfaction.  Linda Brine, FNP   I, Linda Brine, FNP, have reviewed all documentation for this visit. The documentation on  07/08/21 for the exam, diagnosis, procedures, and orders are all accurate and complete.   IF YOU HAVE BEEN REFERRED TO A SPECIALIST, IT MAY TAKE 1-2 WEEKS TO SCHEDULE/PROCESS THE REFERRAL. IF YOU HAVE NOT HEARD FROM US/SPECIALIST IN TWO WEEKS, PLEASE GIVE [place] A CALL AT [phone].   THE PATIENT IS ENCOURAGED TO PRACTICE SOCIAL DISTANCING DUE TO THE COVID-19 PANDEMIC.

## 2021-06-11 NOTE — Patient Instructions (Signed)
Linda Mccoy , Thank you for taking time to come for your Medicare Wellness Visit. I appreciate your ongoing commitment to your health goals. Please review the following plan we discussed and let me know if I can assist you in the future.   Screening recommendations/referrals: Colonoscopy: completed 08/31/2019 Mammogram: completed 03/17/2021 Bone Density: completed 12/08/2017 Recommended yearly ophthalmology/optometry visit for glaucoma screening and checkup Recommended yearly dental visit for hygiene and checkup  Vaccinations: Influenza vaccine: due 07/13/2021 Pneumococcal vaccine: decline Tdap vaccine: sent to pharmacy Shingles vaccine: decline   Covid-19:04/16/2021, 10/07/2020, 02/19/2020, 01/25/2020  Advanced directives: Advance directive discussed with you today. Even though you declined this today please call our office should you change your mind and we can give you the proper paperwork for you to fill out.  Conditions/risks identified: none  Next appointment: Follow up in one year for your annual wellness visit    Preventive Care 65 Years and Older, Female Preventive care refers to lifestyle choices and visits with your health care provider that can promote health and wellness. What does preventive care include? A yearly physical exam. This is also called an annual well check. Dental exams once or twice a year. Routine eye exams. Ask your health care provider how often you should have your eyes checked. Personal lifestyle choices, including: Daily care of your teeth and gums. Regular physical activity. Eating a healthy diet. Avoiding tobacco and drug use. Limiting alcohol use. Practicing safe sex. Taking low-dose aspirin every day. Taking vitamin and mineral supplements as recommended by your health care provider. What happens during an annual well check? The services and screenings done by your health care provider during your annual well check will depend on your age, overall  health, lifestyle risk factors, and family history of disease. Counseling  Your health care provider may ask you questions about your: Alcohol use. Tobacco use. Drug use. Emotional well-being. Home and relationship well-being. Sexual activity. Eating habits. History of falls. Memory and ability to understand (cognition). Work and work Statistician. Reproductive health. Screening  You may have the following tests or measurements: Height, weight, and BMI. Blood pressure. Lipid and cholesterol levels. These may be checked every 5 years, or more frequently if you are over 78 years old. Skin check. Lung cancer screening. You may have this screening every year starting at age 73 if you have a 30-pack-year history of smoking and currently smoke or have quit within the past 15 years. Fecal occult blood test (FOBT) of the stool. You may have this test every year starting at age 73. Flexible sigmoidoscopy or colonoscopy. You may have a sigmoidoscopy every 5 years or a colonoscopy every 10 years starting at age 49. Hepatitis C blood test. Hepatitis B blood test. Sexually transmitted disease (STD) testing. Diabetes screening. This is done by checking your blood sugar (glucose) after you have not eaten for a while (fasting). You may have this done every 1-3 years. Bone density scan. This is done to screen for osteoporosis. You may have this done starting at age 47. Mammogram. This may be done every 1-2 years. Talk to your health care provider about how often you should have regular mammograms. Talk with your health care provider about your test results, treatment options, and if necessary, the need for more tests. Vaccines  Your health care provider may recommend certain vaccines, such as: Influenza vaccine. This is recommended every year. Tetanus, diphtheria, and acellular pertussis (Tdap, Td) vaccine. You may need a Td booster every 10 years. Zoster vaccine.  You may need this after age  5. Pneumococcal 13-valent conjugate (PCV13) vaccine. One dose is recommended after age 38. Pneumococcal polysaccharide (PPSV23) vaccine. One dose is recommended after age 93. Talk to your health care provider about which screenings and vaccines you need and how often you need them. This information is not intended to replace advice given to you by your health care provider. Make sure you discuss any questions you have with your health care provider. Document Released: 12/26/2015 Document Revised: 08/18/2016 Document Reviewed: 09/30/2015 Elsevier Interactive Patient Education  2017 [place] Prevention in the Home Falls can cause injuries. They can happen to people of all ages. There are many things you can do to make your home safe and to help prevent falls. What can I do on the outside of my home? Regularly fix the edges of walkways and driveways and fix any cracks. Remove anything that might make you trip as you walk through a door, such as a raised step or threshold. Trim any bushes or trees on the path to your home. Use bright outdoor lighting. Clear any walking paths of anything that might make someone trip, such as rocks or tools. Regularly check to see if handrails are loose or broken. Make sure that both sides of any steps have handrails. Any raised decks and porches should have guardrails on the edges. Have any leaves, snow, or ice cleared regularly. Use sand or salt on walking paths during winter. Clean up any spills in your garage right away. This includes oil or grease spills. What can I do in the bathroom? Use night lights. Install grab bars by the toilet and in the tub and shower. Do not use towel bars as grab bars. Use non-skid mats or decals in the tub or shower. If you need to sit down in the shower, use a plastic, non-slip stool. Keep the floor dry. Clean up any water that spills on the floor as soon as it happens. Remove soap buildup in the tub or shower  regularly. Attach bath mats securely with double-sided non-slip rug tape. Do not have throw rugs and other things on the floor that can make you trip. What can I do in the bedroom? Use night lights. Make sure that you have a light by your bed that is easy to reach. Do not use any sheets or blankets that are too big for your bed. They should not hang down onto the floor. Have a firm chair that has side arms. You can use this for support while you get dressed. Do not have throw rugs and other things on the floor that can make you trip. What can I do in the kitchen? Clean up any spills right away. Avoid walking on wet floors. Keep items that you use a lot in easy-to-reach places. If you need to reach something above you, use a strong step stool that has a grab bar. Keep electrical cords out of the way. Do not use floor polish or wax that makes floors slippery. If you must use wax, use non-skid floor wax. Do not have throw rugs and other things on the floor that can make you trip. What can I do with my stairs? Do not leave any items on the stairs. Make sure that there are handrails on both sides of the stairs and use them. Fix handrails that are broken or loose. Make sure that handrails are as long as the stairways. Check any carpeting to make sure that it is  firmly attached to the stairs. Fix any carpet that is loose or worn. Avoid having throw rugs at the top or bottom of the stairs. If you do have throw rugs, attach them to the floor with carpet tape. Make sure that you have a light switch at the top of the stairs and the bottom of the stairs. If you do not have them, ask someone to add them for you. What else can I do to help prevent falls? Wear shoes that: Do not have high heels. Have rubber bottoms. Are comfortable and fit you well. Are closed at the toe. Do not wear sandals. If you use a stepladder: Make sure that it is fully opened. Do not climb a closed stepladder. Make sure that  both sides of the stepladder are locked into place. Ask someone to hold it for you, if possible. Clearly mark and make sure that you can see: Any grab bars or handrails. First and last steps. Where the edge of each step is. Use tools that help you move around (mobility aids) if they are needed. These include: Canes. Walkers. Scooters. Crutches. Turn on the lights when you go into a dark area. Replace any light bulbs as soon as they burn out. Set up your furniture so you have a clear path. Avoid moving your furniture around. If any of your floors are uneven, fix them. If there are any pets around you, be aware of where they are. Review your medicines with your doctor. Some medicines can make you feel dizzy. This can increase your chance of falling. Ask your doctor what other things that you can do to help prevent falls. This information is not intended to replace advice given to you by your health care provider. Make sure you discuss any questions you have with your health care provider. Document Released: 09/25/2009 Document Revised: 05/06/2016 Document Reviewed: 01/03/2015 Elsevier Interactive Patient Education  2017 [employer].

## 2021-06-12 LAB — CMP14+EGFR
ALT: 13 IU/L (ref 0–32)
AST: 19 IU/L (ref 0–40)
Albumin/Globulin Ratio: 1.3 (ref 1.2–2.2)
Albumin: 4.4 g/dL (ref 3.8–4.8)
Alkaline Phosphatase: 87 IU/L (ref 44–121)
BUN/Creatinine Ratio: 16 (ref 12–28)
BUN: 11 mg/dL (ref 8–27)
Bilirubin Total: 0.6 mg/dL (ref 0.0–1.2)
CO2: 27 mmol/L (ref 20–29)
Calcium: 9.7 mg/dL (ref 8.7–10.3)
Chloride: 96 mmol/L (ref 96–106)
Creatinine, Ser: 0.69 mg/dL (ref 0.57–1.00)
Globulin, Total: 3.3 g/dL (ref 1.5–4.5)
Glucose: 93 mg/dL (ref 65–99)
Potassium: 4.6 mmol/L (ref 3.5–5.2)
Sodium: 137 mmol/L (ref 134–144)
Total Protein: 7.7 g/dL (ref 6.0–8.5)
eGFR: 95 mL/min/{1.73_m2} (ref >59)

## 2021-06-12 LAB — HEMOGLOBIN A1C
Est. average glucose Bld gHb Est-mCnc: 117 mg/dL
Hgb A1c MFr Bld: 5.7 % — ABNORMAL HIGH (ref 4.8–5.6)

## 2021-06-16 ENCOUNTER — Telehealth: Payer: Self-pay

## 2021-06-16 ENCOUNTER — Telehealth: Payer: Medicare HMO

## 2021-06-16 NOTE — Telephone Encounter (Signed)
  Care Management   Follow Up Note   06/16/2021 Name: Linda Mccoy MRN: [id number] DOB: 13-Jan-1951   Referred by: Minette Brine, FNP Reason for referral : Chronic Care Management   An unsuccessful telephone outreach was attempted today. The patient was referred to the case management team for assistance with care management and care coordination.   Follow Up Plan: A HIPPA compliant phone message was left for the patient providing contact information and requesting a return call.   Barb Merino, RN, BSN, CCM Care Management Coordinator [place] [facility]  Direct Phone: [phone]

## 2021-07-08 ENCOUNTER — Encounter: Payer: Self-pay | Admitting: Nurse Practitioner

## 2021-07-20 ENCOUNTER — Ambulatory Visit (INDEPENDENT_AMBULATORY_CARE_PROVIDER_SITE_OTHER): Payer: Medicare HMO

## 2021-07-20 ENCOUNTER — Telehealth: Payer: Medicare HMO

## 2021-07-20 DIAGNOSIS — G8929 Other chronic pain: Secondary | ICD-10-CM

## 2021-07-20 DIAGNOSIS — E662 Morbid (severe) obesity with alveolar hypoventilation: Secondary | ICD-10-CM

## 2021-07-20 DIAGNOSIS — I10 Essential (primary) hypertension: Secondary | ICD-10-CM

## 2021-07-20 DIAGNOSIS — R7303 Prediabetes: Secondary | ICD-10-CM

## 2021-07-20 DIAGNOSIS — M25561 Pain in right knee: Secondary | ICD-10-CM

## 2021-07-20 DIAGNOSIS — Z6841 Body Mass Index (BMI) 40.0 and over, adult: Secondary | ICD-10-CM

## 2021-07-24 NOTE — Chronic Care Management (AMB) (Signed)
Chronic Care Management   CCM RN Visit Note  07/20/2021 Name: Linda Mccoy MRN: [id number] DOB: 1951-11-21  Subjective: Linda Mccoy is a 70 y.o. year old female who is a primary care patient of Minette Brine, [place]. The care management team was consulted for assistance with disease management and care coordination needs.    Engaged with patient by telephone for follow up visit in response to provider referral for case management and/or care coordination services.   Consent to Services:  The patient was given information about Chronic Care Management services, agreed to services, and gave verbal consent prior to initiation of services.  Please see initial visit note for detailed documentation.   Patient agreed to services and verbal consent obtained.   Assessment: Review of patient past medical history, allergies, medications, health status, including review of consultants reports, laboratory and other test data, was performed as part of comprehensive evaluation and provision of chronic care management services.   SDOH (Social Determinants of Health) assessments and interventions performed:  Yes, no acute needs   CCM Care Plan  Allergies  Allergen Reactions   Penicillins Hives    Outpatient Encounter Medications as of 07/20/2021  Medication Sig   aspirin EC 81 MG tablet Take 81 mg by mouth daily.   Calcium-Magnesium-Zinc 333-133-5 MG TABS Take 1 tablet by mouth daily.   Cholecalciferol (VITAMIN D) 2000 UNITS CAPS Take 2,000 Units by mouth daily.   diclofenac Sodium (VOLTAREN) 1 % GEL Apply 2 g topically 4 (four) times daily.   lisinopril-hydrochlorothiazide (ZESTORETIC) 20-25 MG tablet TAKE 1 TABLET EVERY DAY   metoprolol tartrate (LOPRESSOR) 25 MG tablet Take 1 tablet (25 mg total) by mouth 2 (two) times daily. (Patient not taking: No sig reported)   No facility-administered encounter medications on file as of 07/20/2021.    Patient Active Problem List   Diagnosis  Date Noted   Palpitations 02/26/2020   Prediabetes 02/12/2019   Vitamin D deficiency 11/29/2018   Multiple thyroid nodules 11/13/2018   Postmenopausal bleeding 05/30/2014   Anemia 05/30/2014   Obesity, unspecified 05/30/2014   Unspecified sleep apnea 05/30/2014   Endometrial polyp 05/30/2014   Hypertension 02/05/2013    Conditions to be addressed/monitored: HTN, Obesity, Prediabetes, Chronic pain   Care Plan : Chronic Pain (Adult)  Updates made by Lynne Logan, RN since 07/20/2021 12:00 AM     Problem: Chronic Pain Management (Chronic Pain)   Priority: High     Long-Range Goal: Chronic Pain Managed   Start Date: 04/09/2021  Expected End Date: 04/09/2022  Recent Progress: On track  Priority: High  Note:   Current Barriers:  Ineffective Self Health Maintenance  Clinical Goal(s):  Collaboration with Minette Brine, FNP regarding development and update of comprehensive plan of care as evidenced by provider attestation and co-signature Inter-disciplinary care team collaboration (see longitudinal plan of care) patient will work with care management team to address care coordination and chronic disease management needs related to Disease Management Educational Needs Care Coordination Medication Management and Education Psychosocial Support   Interventions:  07/20/21 completed successful outbound call with patient  Evaluation of current treatment plan related to  Chronic pain to right knee  , self-management and patient's adherence to plan as established by provider. Collaboration with Minette Brine, FNP regarding development and update of comprehensive plan of care as evidenced by provider attestation       and co-signature Inter-disciplinary care team collaboration (see longitudinal plan of care) Assessed pain level, treatment efficacy and patient  response at regular intervals using a consistent pain scale.  Assessed if pain is associated with mobility or at rest, location,  intensity, frequency, duration, recurrence, pattern and description (e.g., cramping, burning, aching), triggers and relieving factors.   Re-Educated patient on nonpharmacologic measures, such as cognitive behavior therapy, mindfulness, guided imagery, massage, distraction, relaxation, chiropractic manipulation, dietary supplements or acupuncture.  Provided multimodal treatment interventions, such as physical activity, therapeutic exercise, yoga, TENS (transcutaneous electrical nerve stimulation) and manual therapy.  Educated on fall prevention measures; Assessed for DME needs Discussed plans with patient for ongoing care management follow up and provided patient with direct contact information for care management team Self Care Activities:  Continue to keep all scheduled follow up appointments Take medications as directed  Let your healthcare team know if you are unable to take your medications Call your pharmacy for refills at least 7 days prior to running out of medication Use caution when walking and wear good supportive shoes; Avoid walking on uneven ground and use DME as needed to help with balance/gait Follow up with Orthopedics for evaluation and treatment of chronic right knee pain  Patient Goals: - to continue to have mobility and be independent  Follow Up Plan: Telephone follow up appointment with care management team member scheduled for: 09/23/21    Care Plan : Prediabetes  Updates made by Lynne Logan, RN since 07/20/2021 12:00 AM     Problem: Prediabetes   Priority: Medium     Long-Range Goal: Disease Progression Prevented or Minimized   Start Date: 07/20/2021  Expected End Date: 07/20/2022  This Visit's Progress: On track  Priority: Medium  Note:   Objective:  Lab Results  Component Value Date   HGBA1C 5.7 (H) 06/11/2021   Lab Results  Component Value Date   CREATININE 0.69 06/11/2021   CREATININE 0.67 12/03/2020   CREATININE 0.76 06/04/2020   Lab Results   Component Value Date   EGFR 95 06/11/2021   Current Barriers:  Knowledge Deficits related to basic Diabetes pathophysiology and self care/management Knowledge Deficits related to medications used for management of diabetes Case Manager Clinical Goal(s):  patient will demonstrate improved adherence to prescribed treatment plan for diabetes self care/management as evidenced by: adherence to ADA/ carb modified diet exercise 5 days/week contacting provider for new or worsened symptoms or questions Interventions:  07/20/21 completed successful outbound call with patient  Collaboration with Minette Brine, [place] regarding development and update of comprehensive plan of care as evidenced by provider attestation and co-signature Inter-disciplinary care team collaboration (see longitudinal plan of care) Provided education to patient about basic Prediabetes disease process Review of patient status, including review of consultant's reports, relevant laboratory and other test results, and medications completed. Reviewed medications with patient and discussed importance of medication adherence Educated on dietary and exercise recommendations Mailed printed educational materials related to Prediabetes  Discussed plans with patient for ongoing care management follow up and provided patient with direct contact information for care management team Self-Care Activities Self administers oral medications as prescribed Attends all scheduled provider appointments Adheres to prescribed ADA/carb modified Patient Goals: - drink 6 to 8 glasses of water each day - manage portion size  Follow Up Plan: Telephone follow up appointment with care management team member scheduled for: 09/23/21     Plan:Telephone follow up appointment with care management team member scheduled for:  09/23/21  Barb Merino, RN, BSN, CCM Care Management Coordinator [place] [facility]  Direct Phone:  [phone]

## 2021-07-24 NOTE — Patient Instructions (Signed)
Goals Addressed      Disease progression prevented or minimized   On track    Timeframe:  Long-Range Goal Priority:  Medium Start Date:  07/20/21                           Expected End Date:  07/20/22  Next Scheduled follow up: 09/23/21     Self-Care Activities Self administers oral medications as prescribed Attends all scheduled provider appointments Adheres to prescribed ADA/carb modified Patient Goals: - drink 6 to 8 glasses of water each day - manage portion size                      Manage Chronic Pain   On track    Timeframe:  Long-Range Goal Priority:  High Start Date: 04/09/21                            Expected End Date: 04/09/22                    Follow Up Date: 09/23/21   Self Care Activities:  Continue to keep all scheduled follow up appointments Take medications as directed  Let your healthcare team know if you are unable to take your medications Call your pharmacy for refills at least 7 days prior to running out of medication Use caution when walking and wear good supportive shoes; Avoid walking on uneven ground and use DME as needed to help with balance/gait Follow up with Orthopedics for evaluation and treatment of chronic right knee pain  Patient Goals: - to continue to have mobility and be independent   Why is this important?   Day-to-day life can be hard when you have chronic pain.  Pain medicine is just one piece of the treatment puzzle.  You can try these action steps to help you manage your pain.    Notes:

## 2021-09-09 ENCOUNTER — Other Ambulatory Visit: Payer: Self-pay | Admitting: Nurse Practitioner

## 2021-09-09 DIAGNOSIS — I10 Essential (primary) hypertension: Secondary | ICD-10-CM

## 2021-09-21 ENCOUNTER — Ambulatory Visit (INDEPENDENT_AMBULATORY_CARE_PROVIDER_SITE_OTHER): Payer: Medicare HMO | Admitting: Nurse Practitioner

## 2021-09-21 ENCOUNTER — Other Ambulatory Visit: Payer: Self-pay

## 2021-09-21 ENCOUNTER — Encounter: Payer: Self-pay | Admitting: Nurse Practitioner

## 2021-09-21 VITALS — BP 132/80 | HR 90 | Temp 98.6°F | Ht 64.6 in | Wt 244.2 lb

## 2021-09-21 DIAGNOSIS — E2839 Other primary ovarian failure: Secondary | ICD-10-CM

## 2021-09-21 DIAGNOSIS — R051 Acute cough: Secondary | ICD-10-CM | POA: Diagnosis not present

## 2021-09-21 DIAGNOSIS — Z23 Encounter for immunization: Secondary | ICD-10-CM | POA: Diagnosis not present

## 2021-09-21 DIAGNOSIS — I10 Essential (primary) hypertension: Secondary | ICD-10-CM

## 2021-09-21 LAB — POC COVID19 BINAXNOW: SARS Coronavirus 2 Ag: NEGATIVE

## 2021-09-21 LAB — POC INFLUENZA A&B (BINAX/QUICKVUE)
Influenza A, POC: NEGATIVE
Influenza B, POC: NEGATIVE

## 2021-09-21 MED ORDER — TETANUS-DIPHTH-ACELL PERTUSSIS 5-2.5-18.5 LF-MCG/0.5 IM SUSP: 0.5000 mL | Freq: Once | INTRAMUSCULAR | 0 refills | Status: AC

## 2021-09-21 NOTE — Patient Instructions (Signed)

## 2021-09-21 NOTE — Progress Notes (Signed)
I,Yamilka J Llittleton,acting as a Education administrator for [employer], FNP.,have documented all relevant documentation on the behalf of Minette Brine, FNP,as directed by  Minette Brine, FNP while in the presence of Minette Brine, [place].  This visit occurred during the SARS-CoV-2 public health emergency.  Safety protocols were in place, including screening questions prior to the visit, additional usage of staff PPE, and extensive cleaning of exam room while observing appropriate contact time as indicated for disinfecting solutions.  Subjective:     Patient ID: Linda Mccoy , female    DOB: 06/20/1951 , 70 y.o.   MRN: [id number]   Chief Complaint  Patient presents with   Hypertension    HPI  Ms. Lasalle presents today for a follow up for her HTN. She reports she had her flu, pneumonia and her 3rd booster at [facility]     Wt Readings from Last 3 Encounters: 09/21/21 : 244 lb 3.2 oz (110.8 kg) 06/11/21 : 243 lb 13.3 oz (110.6 kg) 06/11/21 : 243 lb 12.8 oz (110.6 kg)      Hypertension This is a chronic problem. The current episode started more than 1 year ago. The problem has been gradually improving since onset. The problem is controlled. Pertinent negatives include no anxiety, chest pain, headaches or palpitations. There are no associated agents to hypertension. Risk factors for coronary artery disease include obesity. Past treatments include beta blockers, calcium channel blockers and diuretics. The current treatment provides moderate improvement. There is no history of angina or kidney disease. There is no history of chronic renal disease.    Past Medical History:  Diagnosis Date   Anemia    History   Borderline diabetes    no meds   Depression    no meds   Endometrial polyp 05/30/2014   Fibroids    Goiter    History of blood transfusion    approx 20 yrs ago at [facility]   Hypertension    Multiple thyroid nodules 11/13/2018   Obesity, unspecified 05/30/2014   Postmenopausal  bleeding 05/30/2014   Prediabetes 02/12/2019   Sleep apnea    Does not use CPAP   SVD (spontaneous vaginal delivery)    x 3   Unspecified sleep apnea 05/30/2014   Vitamin D deficiency 11/29/2018     Family History  Problem Relation Age of Onset   Diabetes Mother    Hypertension Mother    Alcohol abuse Mother    Breast cancer Neg Hx      Current Outpatient Medications:    aspirin EC 81 MG tablet, Take 81 mg by mouth daily., Disp: , Rfl:    Cholecalciferol (VITAMIN D) 2000 UNITS CAPS, Take 2,000 Units by mouth daily., Disp: , Rfl:    diclofenac Sodium (VOLTAREN) 1 % GEL, Apply 2 g topically 4 (four) times daily., Disp: 100 g, Rfl: 2   lisinopril-hydrochlorothiazide (ZESTORETIC) 20-25 MG tablet, TAKE 1 TABLET EVERY DAY, Disp: 90 tablet, Rfl: 1   Tdap (BOOSTRIX) 5-2.5-18.5 LF-MCG/0.5 injection, Inject 0.5 mLs into the muscle once for 1 dose., Disp: 0.5 mL, Rfl: 0   metoprolol tartrate (LOPRESSOR) 25 MG tablet, Take 1 tablet (25 mg total) by mouth 2 (two) times daily. (Patient not taking: No sig reported), Disp: 180 tablet, Rfl: 2   Allergies  Allergen Reactions   Penicillins Hives     Review of Systems  Constitutional: Negative.   HENT:  Negative for congestion, rhinorrhea, sinus pressure, sinus pain and sore throat.   Respiratory:  Positive for  cough (taken cough drops, began over the weekend. Mucous does not have a color.  She has been around kids who have had runny noses.).   Cardiovascular: Negative.  Negative for chest pain, palpitations and leg swelling.  Endocrine: Negative for polydipsia, polyphagia and polyuria.  Musculoskeletal:  Negative for arthralgias and myalgias.  Neurological: Negative.  Negative for dizziness and headaches.  Psychiatric/Behavioral: Negative.      Today's Vitals   09/21/21 0836  BP: 132/80  Pulse: 90  Temp: 98.6 F (37 C)  Weight: 244 lb 3.2 oz (110.8 kg)  Height: 5' 4.6" (1.641 m)  PainSc: 0-No pain   Body mass index is 41.14 kg/m.    Objective:  Physical Exam Vitals reviewed.  Constitutional:      General: She is not in acute distress.    Appearance: Normal appearance. She is obese.  HENT:     Nose: No congestion.  Cardiovascular:     Rate and Rhythm: Normal rate and regular rhythm.     Pulses: Normal pulses.     Heart sounds: Normal heart sounds. No murmur heard. Pulmonary:     Effort: Pulmonary effort is normal. No respiratory distress.     Breath sounds: Normal breath sounds. No wheezing.     Comments: Dry cough noted during visit Skin:    General: Skin is warm and dry.     Capillary Refill: Capillary refill takes less than 2 seconds.  Neurological:     General: No focal deficit present.     Mental Status: She is alert.  Psychiatric:        Mood and Affect: Mood normal.        Behavior: Behavior normal.        Thought Content: Thought content normal.        Judgment: Judgment normal.        Assessment And Plan:     1. Hypertension, unspecified type Comments: Blood pressure is fairly controlled.  Continue current medications and avoid high salt foods.   2. Decreased estrogen level Comments: Referral placed for bone density - [facility] Bone Density; Future  3. Acute cough Comments: Will check for Influenza A/B and rapid Covid - both negative, will send PCR Take Coricidan cough syrup, if not effective will call tessalon perles - Novel Coronavirus, NAA ([facility]) - POC Influenza A&B(BINAX/QUICKVUE) - POC COVID-19  4. Immunization due Will give tetanus vaccine today while in office. Refer to order management. TDAP will be administered to adults 68-9 years old every 10 years. - Tdap (BOOSTRIX) 5-2.5-18.5 LF-MCG/0.5 injection; Inject 0.5 mLs into the muscle once for 1 dose.  Dispense: 0.5 mL; Refill: 0      Patient was given opportunity to ask questions. Patient verbalized understanding of the plan and was able to repeat key elements of the plan. All questions were answered to their satisfaction.   Minette Brine, FNP   I, Minette Brine, FNP, have reviewed all documentation for this visit. The documentation on 09/21/21 for the exam, diagnosis, procedures, and orders are all accurate and complete.   IF YOU HAVE BEEN REFERRED TO A SPECIALIST, IT MAY TAKE 1-2 WEEKS TO SCHEDULE/PROCESS THE REFERRAL. IF YOU HAVE NOT HEARD FROM US/SPECIALIST IN TWO WEEKS, PLEASE GIVE [place] A CALL AT [phone].   THE PATIENT IS ENCOURAGED TO PRACTICE SOCIAL DISTANCING DUE TO THE COVID-19 PANDEMIC.

## 2021-09-22 LAB — NOVEL CORONAVIRUS, NAA: SARS-CoV-2, NAA: NOT DETECTED

## 2021-09-22 LAB — SARS-COV-2, NAA 2 DAY TAT

## 2021-09-23 ENCOUNTER — Telehealth: Payer: Medicare HMO

## 2021-09-30 ENCOUNTER — Other Ambulatory Visit: Payer: Self-pay

## 2021-09-30 ENCOUNTER — Other Ambulatory Visit: Payer: Self-pay | Admitting: Nurse Practitioner

## 2021-09-30 DIAGNOSIS — Z1231 Encounter for screening mammogram for malignant neoplasm of breast: Secondary | ICD-10-CM

## 2021-10-05 ENCOUNTER — Ambulatory Visit (INDEPENDENT_AMBULATORY_CARE_PROVIDER_SITE_OTHER): Payer: Medicare HMO

## 2021-10-05 DIAGNOSIS — I10 Essential (primary) hypertension: Secondary | ICD-10-CM

## 2021-10-05 DIAGNOSIS — G8929 Other chronic pain: Secondary | ICD-10-CM

## 2021-10-05 DIAGNOSIS — R7303 Prediabetes: Secondary | ICD-10-CM

## 2021-10-05 NOTE — Chronic Care Management (AMB) (Signed)
Chronic Care Management   CCM RN Visit Note  10/05/2021 Name: Linda Mccoy MRN: [id number] DOB: 18-Aug-1951  Subjective: Linda Mccoy is a 70 y.o. year old female who is a primary care patient of Minette Brine, [place]. The care management team was consulted for assistance with disease management and care coordination needs.    Engaged with patient by telephone for follow up visit in response to provider referral for case management and/or care coordination services.   Consent to Services:  The patient was given information about Chronic Care Management services, agreed to services, and gave verbal consent prior to initiation of services.  Please see initial visit note for detailed documentation.   Patient agreed to services and verbal consent obtained.   Assessment: Review of patient past medical history, allergies, medications, health status, including review of consultants reports, laboratory and other test data, was performed as part of comprehensive evaluation and provision of chronic care management services.   SDOH (Social Determinants of Health) assessments and interventions performed:  Yes, transportation needs, see care plan   CCM Care Plan  Allergies  Allergen Reactions   Penicillins Hives    Outpatient Encounter Medications as of 10/05/2021  Medication Sig   aspirin EC 81 MG tablet Take 81 mg by mouth daily.   Cholecalciferol (VITAMIN D) 2000 UNITS CAPS Take 2,000 Units by mouth daily.   diclofenac Sodium (VOLTAREN) 1 % GEL Apply 2 g topically 4 (four) times daily.   lisinopril-hydrochlorothiazide (ZESTORETIC) 20-25 MG tablet TAKE 1 TABLET EVERY DAY   metoprolol tartrate (LOPRESSOR) 25 MG tablet Take 1 tablet (25 mg total) by mouth 2 (two) times daily. (Patient not taking: No sig reported)   No facility-administered encounter medications on file as of 10/05/2021.    Patient Active Problem List   Diagnosis Date Noted   Palpitations 02/26/2020    Prediabetes 02/12/2019   Vitamin D deficiency 11/29/2018   Multiple thyroid nodules 11/13/2018   Postmenopausal bleeding 05/30/2014   Anemia 05/30/2014   Obesity, unspecified 05/30/2014   Unspecified sleep apnea 05/30/2014   Endometrial polyp 05/30/2014   Hypertension 02/05/2013    Conditions to be addressed/monitored: HTN, Obesity, Prediabetes, Chronic pain   Care Plan : RN Care Manager Plan of Care  Updates made by Lynne Logan, RN since 10/05/2021 12:00 AM     Problem: Chronic disease education and Care Coordination needs for HTN, Obesity, Prediabetes, Chronic pain   Priority: High     Long-Range Goal: Assist with Chronic disease education and Care Coordination needs for HTN, Obesity, Prediabetes, Chronic pain   Start Date: 10/05/2021  Expected End Date: 10/05/2022  This Visit's Progress: On track  Priority: High  Note:   Current Barriers:  Knowledge Deficits related to plan of care for management of HTN, Obesity, Prediabetes, Chronic pain  Chronic Disease Management support and education needs related to  HTN, Obesity, Prediabetes, Chronic pain   RNCM Clinical Goal(s):  Patient will demonstrate Ongoing health management independence   continue to work with RN Care Manager to address care management and care coordination needs related to   HTN, Obesity, Prediabetes, Chronic pain  will demonstrate ongoing self health care management ability    through collaboration with RN Care manager, provider, and care team.   Interventions: 1:1 collaboration with primary care provider regarding development and update of comprehensive plan of care as evidenced by provider attestation and co-signature Inter-disciplinary care team collaboration (see longitudinal plan of care) Evaluation of current treatment plan related to  self management and patient's adherence to plan as established by provider  Pain Interventions: Pain assessment performed Medications reviewed Reviewed provider  established plan for pain management; Reviewed with patient prescribed pharmacological and nonpharmacological pain relief strategies; essential oils and moist heat (Peppermint) for pain management   Hypertension Interventions: Last practice recorded BP readings:  BP Readings from Last 3 Encounters:  09/21/21 132/80  06/11/21 128/80  06/11/21 128/80  Most recent eGFR/CrCl:  Lab Results  Component Value Date   EGFR 95 06/11/2021    No components found for: CRCL  Evaluation of current treatment plan related to hypertension self management and patient's adherence to plan as established by provider; Discussed plans with patient for ongoing care management follow up and provided patient with direct contact information for care management team; Advised patient, providing education and rationale, to monitor blood pressure daily and record, calling PCP for findings outside established parameters;  Provided education on prescribed diet low Sodium diet;  Discussed complications of poorly controlled blood pressure such as heart disease, stroke, circulatory complications, vision complications, kidney impairment, sexual dysfunction;  Assessed social determinant of health barriers;  Sent referral to embedded [place] for assistance with transportation  Educated on target BP <130/80  Instructed patient on how to accurately monitor BP; Mailed printed instructions  Prediabetes Interventions: Assessed patient's understanding of A1c goal:  <5.7 Discussed plans with patient for ongoing care management follow up and provided patient with direct contact information for care management team; Review of patient status, including review of consultants reports, relevant laboratory and other test results, and medications completed; Educated on dietary recommendations including Meal planning using the plate method and portion control, increasing water to 64 oz daily Lab Results  Component Value Date    HGBA1C 5.7 (H) 06/11/2021    New Goal:  Evaluation of current treatment plan related to Impaired Urinary Elimination, self-management and patient's adherence to plan as established by provider. Discussed plans with patient for ongoing care management follow up and provided patient with direct contact information for care management team Advised patient to keep a journal to track symptoms in order to pin point possible triggers and share with PCP at next scheduled visit; Provided education to patient re: potential triggers for frequent and painful urination, assessed for symptoms suggestive of UTI; Educated patient on the importance to increase water intake to 64 oz daily and avoid caffiene Collaborated with PCP regarding alternative supplement for Urinary health such as Azo Cranberry, PCP approved Instructed patient on use of OTC Azo Cranberry and adding Cranberry juice to diet Mailed printed educational materials related to potential triggers for Cystitis   Patient Goals/Self-Care Activities: Patient will self administer medications as prescribed Patient will attend all scheduled provider appointments Patient will call pharmacy for medication refills Patient will attend church or other social activities Patient will continue to perform ADL's independently Patient will continue to perform IADL's independently Patient will call provider office for new concerns or questions Patient will work with BSW to address care coordination needs and will continue to work with the clinical team to address health care and disease management related needs.    Follow Up Plan:  Telephone follow up appointment with care management team member scheduled for:  12/16/21      Plan:Telephone follow up appointment with care management team member scheduled for:  12/16/21  Barb Merino, RN, BSN, CCM Care Management Coordinator [facility]  Direct Phone: [phone]

## 2021-10-05 NOTE — Progress Notes (Signed)
This encounter was created in error - please disregard.

## 2021-10-05 NOTE — Patient Instructions (Addendum)
Visit Information  Ms. Leyendecker was given information about Chronic Care Management services including:  CCM service includes personalized support from designated clinical staff supervised by her physician, including individualized plan of care and coordination with other care providers 24/7 contact phone numbers for assistance for urgent and routine care needs. Service will only be billed when office clinical staff spend 20 minutes or more in a month to coordinate care. Only one practitioner may furnish and bill the service in a calendar month. The patient may stop CCM services at any time (effective at the end of the month) by phone call to the office staff. The patient will be responsible for cost sharing (co-pay) of up to 20% of the service fee (after annual deductible is met).  Patient agreed to services and verbal consent obtained.   The patient verbalized understanding of instructions, educational materials, and care plan provided today and declined offer to receive copy of patient instructions, educational materials, and care plan.   Telephone follow up appointment with care management team member scheduled for: 12/16/21  Barb Merino, RN, BSN, CCM Care Management Coordinator [facility]  Direct Phone: [phone]    CLINICAL CARE PLAN:  Patient Care Plan: RN Care Manager Plan of Care     Problem Identified: Chronic disease education and Care Coordination needs for HTN, Obesity, Prediabetes, Chronic pain   Priority: High     Long-Range Goal: Assist with Chronic disease education and Care Coordination needs for HTN, Obesity, Prediabetes, Chronic pain   Start Date: 10/05/2021  Expected End Date: 10/05/2022  This Visit's Progress: On track  Priority: High  Note:   Current Barriers:  Knowledge Deficits related to plan of care for management of HTN, Obesity, Prediabetes, Chronic pain  Chronic Disease Management support and education  needs related to  HTN, Obesity, Prediabetes, Chronic pain   RNCM Clinical Goal(s):  Patient will demonstrate Ongoing health management independence   continue to work with RN Care Manager to address care management and care coordination needs related to   HTN, Obesity, Prediabetes, Chronic pain  will demonstrate ongoing self health care management ability    through collaboration with RN Care manager, provider, and care team.   Interventions: 1:1 collaboration with primary care provider regarding development and update of comprehensive plan of care as evidenced by provider attestation and co-signature Inter-disciplinary care team collaboration (see longitudinal plan of care) Evaluation of current treatment plan related to  self management and patient's adherence to plan as established by provider  Pain Interventions: Pain assessment performed Medications reviewed Reviewed provider established plan for pain management; Reviewed with patient prescribed pharmacological and nonpharmacological pain relief strategies; essential oils and moist heat (Peppermint) for pain management   Hypertension Interventions: Last practice recorded BP readings:  BP Readings from Last 3 Encounters:  09/21/21 132/80  06/11/21 128/80  06/11/21 128/80  Most recent eGFR/CrCl:  Lab Results  Component Value Date   EGFR 95 06/11/2021    No components found for: CRCL  Evaluation of current treatment plan related to hypertension self management and patient's adherence to plan as established by provider; Discussed plans with patient for ongoing care management follow up and provided patient with direct contact information for care management team; Advised patient, providing education and rationale, to monitor blood pressure daily and record, calling PCP for findings outside established parameters;  Provided education on prescribed diet low Sodium diet;  Discussed complications of poorly controlled blood pressure  such as heart disease, stroke, circulatory  complications, vision complications, kidney impairment, sexual dysfunction;  Assessed social determinant of health barriers;  Sent referral to embedded [place] for assistance with transportation  Educated on target BP <130/80  Instructed patient on how to accurately monitor BP; Mailed printed instructions  Prediabetes Interventions: Assessed patient's understanding of A1c goal:  <5.7 Discussed plans with patient for ongoing care management follow up and provided patient with direct contact information for care management team; Review of patient status, including review of consultants reports, relevant laboratory and other test results, and medications completed; Educated on dietary recommendations including Meal planning using the plate method and portion control, increasing water to 64 oz daily Lab Results  Component Value Date   HGBA1C 5.7 (H) 06/11/2021    New Goal:  Evaluation of current treatment plan related to Impaired Urinary Elimination, self-management and patient's adherence to plan as established by provider. Discussed plans with patient for ongoing care management follow up and provided patient with direct contact information for care management team Advised patient to keep a journal to track symptoms in order to pin point possible triggers and share with PCP at next scheduled visit; Provided education to patient re: potential triggers for frequent and painful urination, assessed for symptoms suggestive of UTI; Educated patient on the importance to increase water intake to 64 oz daily and avoid caffiene Collaborated with PCP regarding alternative supplement for Urinary health such as Azo Cranberry, PCP approved Instructed patient on use of OTC Azo Cranberry and adding Cranberry juice to diet Mailed printed educational materials related to potential triggers for Cystitis   Patient Goals/Self-Care Activities: Patient will self  administer medications as prescribed Patient will attend all scheduled provider appointments Patient will call pharmacy for medication refills Patient will attend church or other social activities Patient will continue to perform ADL's independently Patient will continue to perform IADL's independently Patient will call provider office for new concerns or questions Patient will work with BSW to address care coordination needs and will continue to work with the clinical team to address health care and disease management related needs.    Follow Up Plan:  Telephone follow up appointment with care management team member scheduled for:  12/16/21

## 2021-10-12 ENCOUNTER — Ambulatory Visit: Payer: Medicare HMO

## 2021-10-12 DIAGNOSIS — G8929 Other chronic pain: Secondary | ICD-10-CM

## 2021-10-12 DIAGNOSIS — I10 Essential (primary) hypertension: Secondary | ICD-10-CM | POA: Diagnosis not present

## 2021-10-12 DIAGNOSIS — R7303 Prediabetes: Secondary | ICD-10-CM

## 2021-10-12 NOTE — Chronic Care Management (AMB) (Signed)
Chronic Care Management    Social Work Note  10/12/2021 Name: Deann Mclaine MRN: [id number] DOB: 04-23-1951  Karrie Fluellen is a 70 y.o. year old female who is a primary care patient of Minette Brine, [place]. The CCM team was consulted to assist the patient with chronic disease management and/or care coordination needs related to:  HTN, Obesity, Prediabetes, Chronic Pain .   Engaged with patient by telephone for follow up visit in response to provider referral for social work chronic care management and care coordination services.   Consent to Services:  The patient was given information about Chronic Care Management services, agreed to services, and gave verbal consent prior to initiation of services.  Please see initial visit note for detailed documentation.   Patient agreed to services and consent obtained.   Assessment: Review of patient past medical history, allergies, medications, and health status, including review of relevant consultants reports was performed today as part of a comprehensive evaluation and provision of chronic care management and care coordination services.     SDOH (Social Determinants of Health) assessments and interventions performed:  SDOH Interventions    Flowsheet Row Most Recent Value  SDOH Interventions   Food Insecurity Interventions Intervention Not Indicated  Housing Interventions Intervention Not Indicated  Transportation Interventions Other (Comment)  [Education on health plan transportation benefit]        Advanced Directives Status: Not addressed in this encounter.  CCM Care Plan  Allergies  Allergen Reactions   Penicillins Hives    Outpatient Encounter Medications as of 10/12/2021  Medication Sig   aspirin EC 81 MG tablet Take 81 mg by mouth daily.   Cholecalciferol (VITAMIN D) 2000 UNITS CAPS Take 2,000 Units by mouth daily.   diclofenac Sodium (VOLTAREN) 1 % GEL Apply 2 g topically 4 (four) times daily.    lisinopril-hydrochlorothiazide (ZESTORETIC) 20-25 MG tablet TAKE 1 TABLET EVERY DAY   metoprolol tartrate (LOPRESSOR) 25 MG tablet Take 1 tablet (25 mg total) by mouth 2 (two) times daily. (Patient not taking: No sig reported)   No facility-administered encounter medications on file as of 10/12/2021.    Patient Active Problem List   Diagnosis Date Noted   Palpitations 02/26/2020   Prediabetes 02/12/2019   Vitamin D deficiency 11/29/2018   Multiple thyroid nodules 11/13/2018   Postmenopausal bleeding 05/30/2014   Anemia 05/30/2014   Obesity, unspecified 05/30/2014   Unspecified sleep apnea 05/30/2014   Endometrial polyp 05/30/2014   Hypertension 02/05/2013    Conditions to be addressed/monitored:  HTN, Obesity, Prediabetes, Chronic Pain ; Transportation  Care Plan : Social Work [facility] Plan of Care  Updates made by Daneen Schick since 10/12/2021 12:00 AM     Problem: Barriers to Treatment      Goal: Barriers to Treatment Identified and Managed   Start Date: 10/12/2021  Priority: High  Note:   Current Barriers:  Chronic disease management support and education needs related to  HTN, Obesity, Prediabetes, Chronic Pain   Transportation Limited understanding of health plan benefit  Social Worker Clinical Goal(s):  patient will work with SW to identify and address any acute and/or chronic care coordination needs related to the self health management of  HTN, Obesity, Prediabetes, Chronic Pain   explore community resource options for unmet needs related to:  Transportation  SW Interventions:  Inter-disciplinary care team collaboration (see longitudinal plan of care) Collaboration with Minette Brine, FNP regarding development and update of comprehensive plan of care as evidenced by provider attestation and co-signature  Collaboration with [place] who indicates patient is in need of transportation resource information for physician appointments Successful  outbound call placed to the patient who indicates her car was broke down when she requested resources. Her car has since been repaired but she is not sure how reliable it is Verbal education provided about Financial trader to the patients home for review Discussed plan for SW to follow up with the patient over the next month to confirm receipt of mailed resource  Patient Goals/Self-Care Activities patient will:   -  Review mailed resource information -Contact SW as needed prior to next scheduled call  Follow Up Plan:  SW will follow up with the patient over the next month       Follow Up Plan: SW will follow up with patient by phone over the next month      Daneen Schick, BSW, CDP Social Worker, Certified Dementia Practitioner [place] [facility] [place] [facility] [phone]

## 2021-10-12 NOTE — Patient Instructions (Signed)
Social Worker Visit Information  Goals we discussed today:   Goals Addressed             This Visit's Progress    Barriers to Treatment Identified and Managed       Timeframe:  Short-Term Goal Priority:  High Start Date:  10.31.22                                        Next planned outreach: 11.21.22  Patient Goals/Self-Care Activities patient will:   - Review mailed resource information -Contact SW as needed prior to next scheduled call         Materials Provided: Yes: written health plan transportation education provided by mail  Patient verbalizes understanding of instructions provided today and agrees to view in [place].   Follow Up Plan: SW will follow up with patient by phone over the next month   Daneen Schick, BSW, CDP Social Worker, Certified Dementia Practitioner [place] [facility] [place] [facility] [phone]

## 2021-10-15 ENCOUNTER — Other Ambulatory Visit: Payer: Self-pay

## 2021-10-15 DIAGNOSIS — R7303 Prediabetes: Secondary | ICD-10-CM

## 2021-10-15 MED ORDER — TRUEPLUS LANCETS 33G MISC
1 refills | Status: AC
Start: 2021-10-15 — End: ?

## 2021-10-15 MED ORDER — TRUE METRIX METER DEVI
1 refills | Status: AC
Start: 2021-10-15 — End: ?

## 2021-10-15 MED ORDER — TRUE METRIX BLOOD GLUCOSE TEST VI STRP: ORAL_STRIP | 12 refills | Status: AC

## 2021-11-02 ENCOUNTER — Ambulatory Visit (INDEPENDENT_AMBULATORY_CARE_PROVIDER_SITE_OTHER): Payer: Medicare HMO

## 2021-11-02 DIAGNOSIS — R7303 Prediabetes: Secondary | ICD-10-CM

## 2021-11-02 DIAGNOSIS — I10 Essential (primary) hypertension: Secondary | ICD-10-CM

## 2021-11-02 DIAGNOSIS — G8929 Other chronic pain: Secondary | ICD-10-CM

## 2021-11-02 NOTE — Patient Instructions (Signed)
Social Worker Visit Information  Goals we discussed today:   Goals Addressed             This Visit's Progress    COMPLETED: Barriers to Treatment Identified and Managed       Timeframe:  Short-Term Goal Priority:  High Start Date:  10.31.22                                         Patient Goals/Self-Care Activities patient will:  - [employer] health plan transportation benefit as needed -Contact SW as needed          Materials Provided: Verbal education about transportation benefit provided by phone  Patient verbalizes understanding of instructions provided today and agrees to view in [place].   Follow Up Plan:  No SW follow up planned at this time. Please contact me as needed.  Daneen Schick, BSW, CDP Social Worker, Certified Dementia Practitioner [place] [facility] [place] [facility] [phone]

## 2021-11-02 NOTE — Chronic Care Management (AMB) (Signed)
Chronic Care Management    Social Work Note  11/02/2021 Name: Shella Lahman MRN: [id number] DOB: Mar 07, 1951  Anna-Marie Coller is a 70 y.o. year old female who is a primary care patient of Minette Brine, [place]. The CCM team was consulted to assist the patient with chronic disease management and/or care coordination needs related to:  HTN, Obesity, Prediabetes, Chronic Pain .   Engaged with patient by telephone for follow up visit in response to provider referral for social work chronic care management and care coordination services.   Consent to Services:  The patient was given information about Chronic Care Management services, agreed to services, and gave verbal consent prior to initiation of services.  Please see initial visit note for detailed documentation.   Patient agreed to services and consent obtained.   Assessment: Review of patient past medical history, allergies, medications, and health status, including review of relevant consultants reports was performed today as part of a comprehensive evaluation and provision of chronic care management and care coordination services.     SDOH (Social Determinants of Health) assessments and interventions performed:  SDOH Interventions    Flowsheet Row Most Recent Value  SDOH Interventions   Transportation Interventions Intervention Not Indicated        Advanced Directives Status: Not addressed in this encounter.  CCM Care Plan  Allergies  Allergen Reactions   Penicillins Hives    Outpatient Encounter Medications as of 11/02/2021  Medication Sig   aspirin EC 81 MG tablet Take 81 mg by mouth daily.   Blood Glucose Monitoring Suppl (TRUE METRIX METER) DEVI Use to check blood sugars once daily R73.03   Cholecalciferol (VITAMIN D) 2000 UNITS CAPS Take 2,000 Units by mouth daily.   diclofenac Sodium (VOLTAREN) 1 % GEL Apply 2 g topically 4 (four) times daily.   glucose blood (TRUE METRIX BLOOD GLUCOSE TEST) test strip Use as  instructed to check blood sugars once daily R73.03   lisinopril-hydrochlorothiazide (ZESTORETIC) 20-25 MG tablet TAKE 1 TABLET EVERY DAY   metoprolol tartrate (LOPRESSOR) 25 MG tablet Take 1 tablet (25 mg total) by mouth 2 (two) times daily. (Patient not taking: No sig reported)   TRUEplus Lancets 33G MISC Check blood sugars once daily R73.03   No facility-administered encounter medications on file as of 11/02/2021.    Patient Active Problem List   Diagnosis Date Noted   Palpitations 02/26/2020   Prediabetes 02/12/2019   Vitamin D deficiency 11/29/2018   Multiple thyroid nodules 11/13/2018   Postmenopausal bleeding 05/30/2014   Anemia 05/30/2014   Obesity, unspecified 05/30/2014   Unspecified sleep apnea 05/30/2014   Endometrial polyp 05/30/2014   Hypertension 02/05/2013    Conditions to be addressed/monitored:  HTN, Prediabetes, Obesity, Chronic Pain ; Transportation  Care Plan : Social Work [facility] Plan of Care  Updates made by Daneen Schick since 11/02/2021 12:00 AM  Completed 11/02/2021   Problem: Barriers to Treatment Resolved 11/02/2021     Goal: Barriers to Treatment Identified and Managed Completed 11/02/2021  Start Date: 10/12/2021  Priority: High  Note:   Current Barriers:  Chronic disease management support and education needs related to  HTN, Obesity, Prediabetes, Chronic Pain   Transportation Limited understanding of health plan benefit  Social Worker Clinical Goal(s):  patient will work with SW to identify and address any acute and/or chronic care coordination needs related to the self health management of  HTN, Obesity, Prediabetes, Chronic Pain   explore community resource options for unmet needs related to:  Transportation  SW Interventions:  Inter-disciplinary care team collaboration (see longitudinal plan of care) Collaboration with Minette Brine, [place] regarding development and update of comprehensive plan of care as evidenced by provider attestation and  co-signature Telephonic visit completed with patient to assess goal progression Confirmed receipt of mailed resource  Reviewed option to access transportation benefit as needed for future physician appointments [facility] assessment completed - no acute challenges Encouraged the patient to contact SW as needed  Patient Goals/Self-Care Activities patient will:   - [employer] health plan transportation benefit as needed -Contact SW as needed         Follow Up Plan:  No SW follow up needed at this time. Patient will remain engaged with RN Care Manager.      Daneen Schick, BSW, CDP Social Worker, Certified Dementia Practitioner [place] [facility] [place] [facility] [phone]

## 2021-11-11 DIAGNOSIS — I10 Essential (primary) hypertension: Secondary | ICD-10-CM

## 2021-12-09 ENCOUNTER — Encounter: Payer: Medicare HMO | Admitting: Nurse Practitioner

## 2021-12-16 ENCOUNTER — Telehealth: Payer: Self-pay

## 2021-12-16 ENCOUNTER — Telehealth: Payer: Medicare HMO

## 2021-12-16 NOTE — Telephone Encounter (Signed)
°  Care Management   Follow Up Note   12/16/2021 Name: Linda Mccoy MRN: [id number] DOB: 04/19/1951   Referred by: Minette Brine, FNP Reason for referral : Chronic Care Management (RN CM Follow up call )   An unsuccessful telephone outreach was attempted today. The patient was referred to the case management team for assistance with care management and care coordination.   Follow Up Plan: Telephone follow up appointment with care management team member scheduled for: 12/17/21  Barb Merino, RN, BSN, CCM Care Management Coordinator [place] [facility]  Direct Phone: [phone]

## 2021-12-17 ENCOUNTER — Telehealth: Payer: Medicare HMO

## 2021-12-17 ENCOUNTER — Ambulatory Visit (INDEPENDENT_AMBULATORY_CARE_PROVIDER_SITE_OTHER): Payer: Medicare HMO

## 2021-12-17 DIAGNOSIS — Z6841 Body Mass Index (BMI) 40.0 and over, adult: Secondary | ICD-10-CM

## 2021-12-17 DIAGNOSIS — I10 Essential (primary) hypertension: Secondary | ICD-10-CM

## 2021-12-17 DIAGNOSIS — R7303 Prediabetes: Secondary | ICD-10-CM

## 2021-12-17 DIAGNOSIS — M25561 Pain in right knee: Secondary | ICD-10-CM

## 2021-12-17 DIAGNOSIS — E662 Morbid (severe) obesity with alveolar hypoventilation: Secondary | ICD-10-CM

## 2021-12-17 DIAGNOSIS — G8929 Other chronic pain: Secondary | ICD-10-CM

## 2021-12-17 NOTE — Patient Instructions (Signed)
Visit Information  Thank you for taking time to visit with me today. Please don't hesitate to contact me if I can be of assistance to you before our next scheduled telephone appointment.  Following are the goals we discussed today:  (Copy and paste patient goals from clinical care plan here)  Our next appointment is by telephone on 02/15/22 at 11:50 AM  Please call the care guide team at [phone] if you need to cancel or reschedule your appointment.   If you are experiencing a Mental Health or [facility] or need someone to talk to, please call 1-800-273-TALK (toll free, 24 hour hotline)   The patient verbalized understanding of instructions, educational materials, and care plan provided today and declined offer to receive copy of patient instructions, educational materials, and care plan.   Barb Merino, RN, BSN, CCM Care Management Coordinator [place] [facility]  Direct Phone: [phone]

## 2021-12-17 NOTE — Chronic Care Management (AMB) (Signed)
Chronic Care Management   CCM RN Visit Note  12/17/2021 Name: Linda Mccoy MRN: [id number] DOB: 1951-11-26  Subjective: Linda Mccoy is a 71 y.o. year old female who is a primary care patient of Minette Brine, [place]. The care management team was consulted for assistance with disease management and care coordination needs.    Engaged with patient by telephone for follow up visit in response to provider referral for case management and/or care coordination services.   Consent to Services:  The patient was given information about Chronic Care Management services, agreed to services, and gave verbal consent prior to initiation of services.  Please see initial visit note for detailed documentation.   Patient agreed to services and verbal consent obtained.   Assessment: Review of patient past medical history, allergies, medications, health status, including review of consultants reports, laboratory and other test data, was performed as part of comprehensive evaluation and provision of chronic care management services.   SDOH (Social Determinants of Health) assessments and interventions performed:  Yes, no acute challenges   CCM Care Plan  Allergies  Allergen Reactions   Penicillins Hives    Outpatient Encounter Medications as of 12/17/2021  Medication Sig   aspirin EC 81 MG tablet Take 81 mg by mouth daily.   Blood Glucose Monitoring Suppl (TRUE METRIX METER) DEVI Use to check blood sugars once daily R73.03   Cholecalciferol (VITAMIN D) 2000 UNITS CAPS Take 2,000 Units by mouth daily.   diclofenac Sodium (VOLTAREN) 1 % GEL Apply 2 g topically 4 (four) times daily.   glucose blood (TRUE METRIX BLOOD GLUCOSE TEST) test strip Use as instructed to check blood sugars once daily R73.03   lisinopril-hydrochlorothiazide (ZESTORETIC) 20-25 MG tablet TAKE 1 TABLET EVERY DAY   metoprolol tartrate (LOPRESSOR) 25 MG tablet Take 1 tablet (25 mg total) by mouth 2 (two) times daily. (Patient  not taking: No sig reported)   TRUEplus Lancets 33G MISC Check blood sugars once daily R73.03   No facility-administered encounter medications on file as of 12/17/2021.    Patient Active Problem List   Diagnosis Date Noted   Palpitations 02/26/2020   Prediabetes 02/12/2019   Vitamin D deficiency 11/29/2018   Multiple thyroid nodules 11/13/2018   Postmenopausal bleeding 05/30/2014   Anemia 05/30/2014   Obesity, unspecified 05/30/2014   Unspecified sleep apnea 05/30/2014   Endometrial polyp 05/30/2014   Hypertension 02/05/2013    Conditions to be addressed/monitored: HTN, Obesity, Prediabetes, Chronic pain  Care Plan : RN Care Manager Plan of Care  Updates made by Lynne Logan, RN since 12/17/2021 12:00 AM     Problem: Chronic disease education and Care Coordination needs for HTN, Obesity, Prediabetes, Chronic pain   Priority: High     Long-Range Goal: Assist with Chronic disease education and Care Coordination needs for HTN, Obesity, Prediabetes, Chronic pain   Start Date: 10/05/2021  Expected End Date: 10/05/2022  Recent Progress: On track  Priority: High  Note:   Current Barriers:  Knowledge Deficits related to plan of care for management of HTN, Obesity, Prediabetes, Chronic pain  Chronic Disease Management support and education needs related to  HTN, Obesity, Prediabetes, Chronic pain   RNCM Clinical Goal(s):  Patient will demonstrate Ongoing health management independence   continue to work with RN Care Manager to address care management and care coordination needs related to   HTN, Obesity, Prediabetes, Chronic pain  will demonstrate ongoing self health care management ability    through collaboration with RN Care  manager, provider, and care team.   Interventions: 1:1 collaboration with primary care provider regarding development and update of comprehensive plan of care as evidenced by provider attestation and co-signature Inter-disciplinary care team collaboration  (see longitudinal plan of care) Evaluation of current treatment plan related to  self management and patient's adherence to plan as established by provider  Pain Interventions: (Status: Condition stable. Not addressed this visit.) Pain assessment performed Medications reviewed Reviewed provider established plan for pain management; Reviewed with patient prescribed pharmacological and nonpharmacological pain relief strategies; essential oils and moist heat (Peppermint) for pain management  Discussed plans with patient for ongoing care management follow up and provided patient with direct contact information for care management team  Hypertension Interventions: (Status: Goal on track. Yes) Last practice recorded BP readings:  BP Readings from Last 3 Encounters:  09/21/21 132/80  06/11/21 128/80  06/11/21 128/80  Most recent eGFR/CrCl:  Lab Results  Component Value Date   EGFR 95 06/11/2021    No components found for: CRCL  Evaluation of current treatment plan related to hypertension self management and patient's adherence to plan as established by provider Advised patient, providing education and rationale, to monitor blood pressure daily and record, calling PCP for findings outside established parameters Provided education on prescribed diet low Sodium diet Sent referral to embedded [place] for assistance with transportation  Educated on target BP <130/80  Instructed patient on how to accurately monitor BP; Determined patient did not receive her mailed printed instructions for self monitoring BP, will resend  Discussed plans with patient for ongoing care management follow up and provided patient with direct contact information for care management team  Prediabetes Interventions: (Status: Condition stable. Not addressed this call.) Assessed patient's understanding of A1c goal:  <5.7 Discussed plans with patient for ongoing care management follow up and provided patient with  direct contact information for care management team; Review of patient status, including review of consultants reports, relevant laboratory and other test results, and medications completed; Educated on dietary recommendations including Meal planning using the plate method and portion control, increasing water to 64 oz daily Lab Results  Component Value Date   HGBA1C 5.7 (H) 06/11/2021    Impaired Urinary Elimination: Goal on track:  Yes. Evaluation of current treatment plan related to  Impaired Urinary Elimination , self-management and patient's adherence to plan as established by provider. Determined patient started Cranberry juice and AZO Cranberry, she is using as directed and reports her urinary frequency has improved Educated patient on importance of keeping record of triggers that may contribute to urinary frequency  Determined patient did not receive mailed printed educational materials related to potential triggers for Cystitis, will resend Discussed plans with patient for ongoing care management follow up and provided patient with direct contact information for care management team    Patient Goals/Self-Care Activities: Take all medications as prescribed Attend all scheduled provider appointments Call pharmacy for medication refills 3-7 days in advance of running out of medications Perform all self care activities independently  Perform IADL's (shopping, preparing meals, housekeeping, managing finances) independently Call provider office for new concerns or questions  drink 6 to 8 glasses of water each day manage portion size check blood pressure 3 times per week take blood pressure log to all doctor appointments call doctor for signs and symptoms of high blood pressure take medications for blood pressure exactly as prescribed report new symptoms to your doctor  Follow Up Plan:  Telephone follow up appointment with care  management team member scheduled for:  02/15/22       Plan:Telephone follow up appointment with care management team member scheduled for:  02/15/22  Barb Merino, RN, BSN, CCM Care Management Coordinator [place] [facility]  Direct Phone: [phone]

## 2021-12-29 ENCOUNTER — Encounter: Payer: Self-pay | Admitting: Nurse Practitioner

## 2021-12-29 ENCOUNTER — Ambulatory Visit (INDEPENDENT_AMBULATORY_CARE_PROVIDER_SITE_OTHER): Payer: Medicare HMO | Admitting: Nurse Practitioner

## 2021-12-29 ENCOUNTER — Other Ambulatory Visit: Payer: Self-pay

## 2021-12-29 VITALS — BP 126/84 | HR 60 | Temp 98.3°F | Ht 64.6 in | Wt 244.0 lb

## 2021-12-29 DIAGNOSIS — I10 Essential (primary) hypertension: Secondary | ICD-10-CM | POA: Diagnosis not present

## 2021-12-29 DIAGNOSIS — E662 Morbid (severe) obesity with alveolar hypoventilation: Secondary | ICD-10-CM | POA: Diagnosis not present

## 2021-12-29 DIAGNOSIS — Z6841 Body Mass Index (BMI) 40.0 and over, adult: Secondary | ICD-10-CM | POA: Diagnosis not present

## 2021-12-29 NOTE — Progress Notes (Signed)
I,Linda Mccoy,acting as a Education administrator for [employer], NP.,have documented all relevant documentation on the behalf of [employer], NP,as directed by  Linda Castilla, NP while in the presence of Linda Castilla, NP.  This visit occurred during the SARS-CoV-2 public health emergency.  Safety protocols were in place, including screening questions prior to the visit, additional usage of staff PPE, and extensive cleaning of exam room while observing appropriate contact time as indicated for disinfecting solutions.  Subjective:     Patient ID: Linda Mccoy , female    DOB: Feb 07, 1951 , 71 y.o.   MRN: [id number]   Chief Complaint  Patient presents with   Hypertension    HPI  She is here for a BP check. She is not on a diet. She is trying to eat healthy. She is walking and trying to stay very hydrated with water. She is coming back in April for her physical and she would like to get her lab work done at that time. She is going to try to work on her diet and exercise. She denies HA, chest pain, SOB.     Past Medical History:  Diagnosis Date   Anemia    History   Borderline diabetes    no meds   Depression    no meds   Endometrial polyp 05/30/2014   Fibroids    Goiter    History of blood transfusion    approx 20 yrs ago at [facility]   Hypertension    Multiple thyroid nodules 11/13/2018   Obesity, unspecified 05/30/2014   Postmenopausal bleeding 05/30/2014   Prediabetes 02/12/2019   Sleep apnea    Does not use CPAP   SVD (spontaneous vaginal delivery)    x 3   Unspecified sleep apnea 05/30/2014   Vitamin D deficiency 11/29/2018     Family History  Problem Relation Age of Onset   Diabetes Mother    Hypertension Mother    Alcohol abuse Mother    Breast cancer Neg Hx      Current Outpatient Medications:    aspirin EC 81 MG tablet, Take 81 mg by mouth daily., Disp: , Rfl:    Blood Glucose Monitoring Suppl (TRUE METRIX METER) DEVI, Use to check blood sugars once  daily R73.03, Disp: 1 each, Rfl: 1   Cholecalciferol (VITAMIN D) 2000 UNITS CAPS, Take 2,000 Units by mouth daily., Disp: , Rfl:    diclofenac Sodium (VOLTAREN) 1 % GEL, Apply 2 g topically 4 (four) times daily., Disp: 100 g, Rfl: 2   glucose blood (TRUE METRIX BLOOD GLUCOSE TEST) test strip, Use as instructed to check blood sugars once daily R73.03, Disp: 100 each, Rfl: 12   lisinopril-hydrochlorothiazide (ZESTORETIC) 20-25 MG tablet, TAKE 1 TABLET EVERY DAY, Disp: 90 tablet, Rfl: 1   metoprolol tartrate (LOPRESSOR) 25 MG tablet, Take 1 tablet (25 mg total) by mouth 2 (two) times daily. (Patient not taking: Reported on 12/03/2020), Disp: 180 tablet, Rfl: 2   TRUEplus Lancets 33G MISC, Check blood sugars once daily R73.03, Disp: 100 each, Rfl: 1   Allergies  Allergen Reactions   Penicillins Hives     Review of Systems  Constitutional:  Negative for chills and fever.  HENT:  Negative for congestion.   Respiratory:  Negative for cough, choking and wheezing.   Cardiovascular:  Negative for chest pain and palpitations.  Gastrointestinal:  Negative for constipation and diarrhea.  Musculoskeletal:  Negative for myalgias.  Neurological:  Negative for headaches.    Today's Vitals  12/29/21 0913  BP: 126/84  Pulse: 60  Temp: 98.3 F (36.8 C)  Weight: 244 lb (110.7 kg)  Height: 5' 4.6" (1.641 m)   Body mass index is 41.11 kg/m.  Wt Readings from Last 3 Encounters:  12/29/21 244 lb (110.7 kg)  09/21/21 244 lb 3.2 oz (110.8 kg)  06/11/21 243 lb 13.3 oz (110.6 kg)    BP Readings from Last 3 Encounters:  12/29/21 126/84  09/21/21 132/80  06/11/21 128/80    Objective:  Physical Exam Constitutional:      Appearance: Normal appearance. She is obese.  HENT:     Head: Normocephalic and atraumatic.  Cardiovascular:     Rate and Rhythm: Normal rate and regular rhythm.     Pulses: Normal pulses.     Heart sounds: Normal heart sounds. No murmur heard. Pulmonary:     Effort: Pulmonary  effort is normal. No respiratory distress.     Breath sounds: Normal breath sounds. No wheezing.  Skin:    General: Skin is warm and dry.     Capillary Refill: Capillary refill takes less than 2 seconds.  Neurological:     Mental Status: She is alert.        Assessment And Plan:     1. Hypertension, unspecified type -Her BP today was 126/84 -She is compliant with her medication -She does not need any refills at this time  -She is trying to work on her diet limiting the intake of salt and processed foods.  -Labs at next visit   2. Class 3 obesity with alveolar hypoventilation and body mass index (BMI) of 40.0 to 44.9 in adult, unspecified whether serious comorbidity present ([place] -Advised patient on a healthy diet including avoiding fast food and red meats. Increase the intake of lean meats including grilled chicken and [place].  Drink a lot of water. Decrease intake of fatty foods. Exercise for 30-45 min. 4-5 a week to decrease the risk of cardiac event.    The patient was encouraged to call or send a message through [place] for any questions or concerns.   Follow up: if symptoms persist or do not get better.   Side effects and appropriate use of all the medication(s) were discussed with the patient today. Patient advised to use the medication(s) as directed by their healthcare provider. The patient was encouraged to read, review, and understand all associated package inserts and contact our office with any questions or concerns. The patient accepts the risks of the treatment plan and had an opportunity to ask questions.    Patient was given opportunity to ask questions. Patient verbalized understanding of the plan and was able to repeat key elements of the plan. All questions were answered to their satisfaction.  Linda Roarke Marciano, DNP   I, Linda Mccoy have reviewed all documentation for this visit. The documentation on 12/29/21 for the exam, diagnosis, procedures, and orders are all  accurate and complete.    IF YOU HAVE BEEN REFERRED TO A SPECIALIST, IT MAY TAKE 1-2 WEEKS TO SCHEDULE/PROCESS THE REFERRAL. IF YOU HAVE NOT HEARD FROM US/SPECIALIST IN TWO WEEKS, PLEASE GIVE [place] A CALL AT [phone].   THE PATIENT IS ENCOURAGED TO PRACTICE SOCIAL DISTANCING DUE TO THE COVID-19 PANDEMIC.

## 2021-12-29 NOTE — Patient Instructions (Signed)

## 2022-01-12 DIAGNOSIS — I10 Essential (primary) hypertension: Secondary | ICD-10-CM

## 2022-01-12 DIAGNOSIS — R7303 Prediabetes: Secondary | ICD-10-CM | POA: Diagnosis not present

## 2022-02-15 ENCOUNTER — Telehealth: Payer: Medicare HMO

## 2022-02-15 ENCOUNTER — Telehealth: Payer: Self-pay

## 2022-02-15 NOTE — Telephone Encounter (Signed)
?  Care Management  ? ?Follow Up Note ? ? ?02/15/2022 ?Name: Linda Mccoy MRN: [id number] DOB: 10/04/1951 ? ? ?Referred by: Minette Brine, FNP ?Reason for referral : Chronic Care Management (RN CM Follow up call ) ? ? ?An unsuccessful telephone outreach was attempted today. The patient was referred to the case management team for assistance with care management and care coordination.  ? ?Follow Up Plan: A HIPPA compliant phone message was left for the patient providing contact information and requesting a return call.  ? ?Barb Merino, RN, BSN, CCM ?Care Management Coordinator ?[facility]  ?Direct Phone: [phone] ? ? ?

## 2022-03-19 ENCOUNTER — Ambulatory Visit
Admission: RE | Admit: 2022-03-19 | Discharge: 2022-03-19 | Disposition: A | Discharge status: Home / Self Care | Payer: Medicare PPO | Source: Ambulatory Visit | Attending: Nurse Practitioner | Admitting: Nurse Practitioner

## 2022-03-19 DIAGNOSIS — Z1231 Encounter for screening mammogram for malignant neoplasm of breast: Secondary | ICD-10-CM

## 2022-03-19 DIAGNOSIS — E2839 Other primary ovarian failure: Secondary | ICD-10-CM

## 2022-03-19 DIAGNOSIS — Z780 Asymptomatic menopausal state: Secondary | ICD-10-CM | POA: Diagnosis not present

## 2022-03-19 DIAGNOSIS — M85852 Other specified disorders of bone density and structure, left thigh: Secondary | ICD-10-CM | POA: Diagnosis not present

## 2022-03-31 ENCOUNTER — Encounter: Payer: Self-pay | Admitting: Nurse Practitioner

## 2022-03-31 ENCOUNTER — Ambulatory Visit (INDEPENDENT_AMBULATORY_CARE_PROVIDER_SITE_OTHER): Payer: Medicare PPO | Admitting: Nurse Practitioner

## 2022-03-31 VITALS — BP 132/80 | HR 95 | Temp 98.4°F | Ht 64.8 in | Wt 248.4 lb

## 2022-03-31 DIAGNOSIS — E66813 Obesity, class 3: Secondary | ICD-10-CM

## 2022-03-31 DIAGNOSIS — Z6841 Body Mass Index (BMI) 40.0 and over, adult: Secondary | ICD-10-CM

## 2022-03-31 DIAGNOSIS — Z23 Encounter for immunization: Secondary | ICD-10-CM | POA: Diagnosis not present

## 2022-03-31 DIAGNOSIS — I119 Hypertensive heart disease without heart failure: Secondary | ICD-10-CM | POA: Diagnosis not present

## 2022-03-31 DIAGNOSIS — E559 Vitamin D deficiency, unspecified: Secondary | ICD-10-CM

## 2022-03-31 DIAGNOSIS — R7303 Prediabetes: Secondary | ICD-10-CM

## 2022-03-31 DIAGNOSIS — Z0000 Encounter for general adult medical examination without abnormal findings: Secondary | ICD-10-CM | POA: Diagnosis not present

## 2022-03-31 DIAGNOSIS — I10 Essential (primary) hypertension: Secondary | ICD-10-CM

## 2022-03-31 LAB — POCT URINALYSIS DIPSTICK
Bilirubin, UA: NEGATIVE
Glucose, UA: NEGATIVE
Ketones, UA: NEGATIVE
Leukocytes, UA: NEGATIVE
Nitrite, UA: NEGATIVE
Protein, UA: NEGATIVE
Spec Grav, UA: 1.025 (ref 1.010–1.025)
Urobilinogen, UA: 0.2 E.U./dL
pH, UA: 5 (ref 5.0–8.0)

## 2022-03-31 MED ORDER — WEGOVY 0.25 MG/0.5ML ~~LOC~~ SOAJ: 0.2500 mg | SUBCUTANEOUS | 0 refills | Status: AC

## 2022-03-31 NOTE — Progress Notes (Addendum)
Rich Brave [place],acting as a Education administrator for Minette Brine, FNP.,have documented all relevant documentation on the behalf of Minette Brine, FNP,as directed by  Minette Brine, FNP while in the presence of Minette Brine, [place].  This visit occurred during the SARS-CoV-2 public health emergency.  Safety protocols were in place, including screening questions prior to the visit, additional usage of staff PPE, and extensive cleaning of exam room while observing appropriate contact time as indicated for disinfecting solutions.  Subjective:     Patient ID: Linda Mccoy , female    DOB: 27-Sep-1951 , 71 y.o.   MRN: [id number]   Chief Complaint  Patient presents with   Annual Exam    HPI  Patient presents today for a physical. Patient reports compliance with her medications. Patient has a question on whether she should be taking the metoprolol or not.   Wt Readings from Last 3 Encounters: 12/29/21 : 244 lb (110.7 kg) 09/21/21 : 244 lb 3.2 oz (110.8 kg) 06/11/21 : 243 lb 13.3 oz (110.6 kg)      Past Medical History:  Diagnosis Date   Anemia    History   Borderline diabetes    no meds   Depression    no meds   Endometrial polyp 05/30/2014   Fibroids    Goiter    History of blood transfusion    approx 20 yrs ago at [facility]   Hypertension    Multiple thyroid nodules 11/13/2018   Obesity, unspecified 05/30/2014   Postmenopausal bleeding 05/30/2014   Prediabetes 02/12/2019   Sleep apnea    Does not use CPAP   SVD (spontaneous vaginal delivery)    x 3   Unspecified sleep apnea 05/30/2014   Vitamin D deficiency 11/29/2018     Family History  Problem Relation Age of Onset   Diabetes Mother    Hypertension Mother    Alcohol abuse Mother    Breast cancer Neg Hx      Current Outpatient Medications:    aspirin EC 81 MG tablet, Take 81 mg by mouth daily., Disp: , Rfl:    Blood Glucose Monitoring Suppl (TRUE METRIX METER) DEVI, Use to check blood sugars once daily R73.03, Disp: 1 each, Rfl: 1    Cholecalciferol (VITAMIN D) 2000 UNITS CAPS, Take 2,000 Units by mouth daily., Disp: , Rfl:    diclofenac Sodium (VOLTAREN) 1 % GEL, Apply 2 g topically 4 (four) times daily., Disp: 100 g, Rfl: 2   glucose blood (TRUE METRIX BLOOD GLUCOSE TEST) test strip, Use as instructed to check blood sugars once daily R73.03, Disp: 100 each, Rfl: 12   lisinopril-hydrochlorothiazide (ZESTORETIC) 20-25 MG tablet, TAKE 1 TABLET EVERY DAY, Disp: 90 tablet, Rfl: 1   Semaglutide-Weight Management (WEGOVY) 0.25 MG/0.5ML SOAJ, Inject 0.25 mg into the skin once a week., Disp: 2 mL, Rfl: 0   TRUEplus Lancets 33G MISC, Check blood sugars once daily R73.03, Disp: 100 each, Rfl: 1   metoprolol tartrate (LOPRESSOR) 25 MG tablet, Take 1 tablet (25 mg total) by mouth 2 (two) times daily. (Patient not taking: Reported on 12/03/2020), Disp: 180 tablet, Rfl: 2   Allergies  Allergen Reactions   Penicillins Hives      The patient states she is post menopausal status.  Patient's last menstrual period was 12/26/2010.. Negative for Dysmenorrhea and Negative for Menorrhagia. Negative for: breast discharge, breast lump(s), breast pain and breast self exam. Associated symptoms include abnormal vaginal bleeding. Pertinent negatives include abnormal bleeding (hematology), anxiety, decreased libido, depression, difficulty  falling sleep, dyspareunia, history of infertility, nocturia, sexual dysfunction, sleep disturbances, urinary incontinence, urinary urgency, vaginal discharge and vaginal itching. Diet regular.  The patient states her exercise level is walking - 4-5 days a week - 20 minutes  The patient's tobacco use is:  Social History   Tobacco Use  Smoking Status Never  Smokeless Tobacco Never   She has been exposed to passive smoke. The patient's alcohol use is:  Social History   Substance and Sexual Activity  Alcohol Use Not Currently   Additional information: Last pap 12/03/2020, next one scheduled for 12/04/2023.     Review of Systems  Constitutional: Negative.   HENT: Negative.    Eyes: Negative.   Respiratory: Negative.    Cardiovascular: Negative.   Gastrointestinal: Negative.   Endocrine: Negative.   Genitourinary: Negative.   Musculoskeletal: Negative.   Skin: Negative.   Allergic/Immunologic: Negative.   Neurological: Negative.   Hematological: Negative.   Psychiatric/Behavioral: Negative.      Today's Vitals   03/31/22 1416  BP: 132/80  Pulse: 95  Temp: 98.4 F (36.9 C)  Weight: 248 lb 6.4 oz (112.7 kg)  Height: 5' 4.8" (1.646 m)  PainSc: 0-No pain   Body mass index is 41.59 kg/m.   Objective:  Physical Exam Vitals reviewed. Exam conducted with a chaperone present.  Constitutional:      General: She is not in acute distress.    Appearance: Normal appearance. She is well-developed. She is obese.  HENT:     Head: Normocephalic and atraumatic.     Right Ear: Hearing, tympanic membrane, ear canal and external ear normal.     Left Ear: Hearing, tympanic membrane, ear canal and external ear normal.     Nose:     Comments: Deferred - masked    Mouth/Throat:     Comments: Deferred - masked Eyes:     General: Lids are normal.     Extraocular Movements: Extraocular movements intact.     Conjunctiva/sclera: Conjunctivae normal.     Pupils: Pupils are equal, round, and reactive to light.     Funduscopic exam:    Right eye: No papilledema.        Left eye: No papilledema.  Neck:     Thyroid: No thyroid mass.     Vascular: No carotid bruit.  Cardiovascular:     Rate and Rhythm: Normal rate and regular rhythm.     Pulses: Normal pulses.     Heart sounds: Normal heart sounds. No murmur heard. Pulmonary:     Effort: Pulmonary effort is normal. No respiratory distress.     Breath sounds: Normal breath sounds. No wheezing.  Chest:  Breasts:    Right: Normal. No mass or tenderness.     Left: Normal. No mass or tenderness.  Abdominal:     General: Abdomen is flat. Bowel  sounds are normal.     Palpations: Abdomen is soft.  Genitourinary:    Pubic Area: No rash.      Tanner stage (genital): 5.     Labia:        Right: No rash or tenderness.        Left: No rash or tenderness.      Vagina: Normal.     Adnexa: Right adnexa normal and left adnexa normal.       Right: No tenderness.         Left: No tenderness.    Musculoskeletal:        General: No  swelling or tenderness. Normal range of motion.     Cervical back: Full passive range of motion without pain, normal range of motion and neck supple.     Right lower leg: No edema.     Left lower leg: No edema.  Lymphadenopathy:     Upper Body:     Right upper body: No supraclavicular, axillary or pectoral adenopathy.     Left upper body: No supraclavicular, axillary or pectoral adenopathy.  Skin:    General: Skin is warm and dry.     Capillary Refill: Capillary refill takes less than 2 seconds.  Neurological:     General: No focal deficit present.     Mental Status: She is alert and oriented to person, place, and time.     Cranial Nerves: No cranial nerve deficit.     Sensory: No sensory deficit.     Motor: No weakness.  Psychiatric:        Mood and Affect: Mood normal.        Behavior: Behavior normal.        Thought Content: Thought content normal.        Judgment: Judgment normal.        Assessment And Plan:     1. Encounter for general adult medical examination w/o abnormal findings Behavior modifications discussed and diet history reviewed.   Pt will continue to exercise regularly and modify diet with low GI, plant based foods and decrease intake of processed foods.  Recommend intake of daily multivitamin, Vitamin D, and calcium.  Recommend mammogram and colonoscopy(up to date) for preventive screenings, as well as recommend immunizations that include influenza, TDAP (given this vsiit), and Shingles (declines at this time)  - Lipid panel  2. Benign Hypertension without congestive heart  failure Comments: Blood pressure is fairly controlled, continue current medications. She is to call Dr Irish Lack to see if she is to continue Metoprolol she has not been taking - POCT Urinalysis Dipstick (81002) - Microalbumin / Creatinine Urine Ratio - EKG 12-Lead - CBC - CMP14+EGFR  3. Prediabetes Comments: Stable, no current medications - Hemoglobin A1c  4. Vitamin D deficiency Will check vitamin D level and supplement as needed.    Also encouraged to spend 15 minutes in the sun daily.  - Vitamin D (25 hydroxy)  5. Immunization due Will give tetanus vaccine today while in office. Refer to order management. TDAP will be administered to adults 70-3 years old every 10 years. - Tdap vaccine greater than or equal to 7yo IM  6. Class 3 severe obesity with serious comorbidity and body mass index (BMI) of 40.0 to 44.9 in adult, unspecified obesity type (HCC) Chronic Discussed healthy diet and regular exercise options  Encouraged to exercise at least 150 minutes per week with 2 days of strength training Will start wegovy pending insurance approval she is to titrate weekly, discussed side effects of nausea, abdominal pain or difficulty swallowing to notify office. [facility] teaching done , will see if she is approved with insurance Return in 2 months for weight check. we have started [facility], discussed side effects to include nausea, difficulty swallowing and abdominal pain. She is to titrate weekly as tolerated. Goal to lose 10% body weight in 4 months if approved by insurance - Semaglutide-Weight Management (WEGOVY) 0.25 MG/0.5ML SOAJ; Inject 0.25 mg into the skin once a week.  Dispense: 2 mL; Refill: 0     Patient was given opportunity to ask questions. Patient verbalized understanding of the plan and  was able to repeat key elements of the plan. All questions were answered to their satisfaction.   Minette Brine, FNP   I, Minette Brine, FNP, have reviewed all documentation for this visit.  The documentation on 03/31/22 for the exam, diagnosis, procedures, and orders are all accurate and complete.   THE PATIENT IS ENCOURAGED TO PRACTICE SOCIAL DISTANCING DUE TO THE COVID-19 PANDEMIC.

## 2022-03-31 NOTE — Patient Instructions (Signed)

## 2022-04-01 ENCOUNTER — Encounter: Payer: Self-pay | Admitting: Nurse Practitioner

## 2022-04-01 LAB — CMP14+EGFR
ALT: 15 IU/L (ref 0–32)
AST: 17 IU/L (ref 0–40)
Albumin/Globulin Ratio: 1.3 (ref 1.2–2.2)
Albumin: 4.2 g/dL (ref 3.8–4.8)
Alkaline Phosphatase: 88 IU/L (ref 44–121)
BUN/Creatinine Ratio: 14 (ref 12–28)
BUN: 10 mg/dL (ref 8–27)
Bilirubin Total: 0.3 mg/dL (ref 0.0–1.2)
CO2: 26 mmol/L (ref 20–29)
Calcium: 10.1 mg/dL (ref 8.7–10.3)
Chloride: 99 mmol/L (ref 96–106)
Creatinine, Ser: 0.73 mg/dL (ref 0.57–1.00)
Globulin, Total: 3.3 g/dL (ref 1.5–4.5)
Glucose: 91 mg/dL (ref 70–99)
Potassium: 4.4 mmol/L (ref 3.5–5.2)
Sodium: 140 mmol/L (ref 134–144)
Total Protein: 7.5 g/dL (ref 6.0–8.5)
eGFR: 90 mL/min/{1.73_m2} (ref >59)

## 2022-04-01 LAB — MICROALBUMIN / CREATININE URINE RATIO
Creatinine, Urine: 90 mg/dL
Microalb/Creat Ratio: 47 mg/g creat — ABNORMAL HIGH (ref 0–29)
Microalbumin, Urine: 42.6 ug/mL

## 2022-04-01 LAB — HEMOGLOBIN A1C
Est. average glucose Bld gHb Est-mCnc: 123 mg/dL
Hgb A1c MFr Bld: 5.9 % — ABNORMAL HIGH (ref 4.8–5.6)

## 2022-04-01 LAB — CBC
Hematocrit: 42.5 % (ref 34.0–46.6)
Hemoglobin: 13.1 g/dL (ref 11.1–15.9)
MCH: 20.5 pg — ABNORMAL LOW (ref 26.6–33.0)
MCHC: 30.8 g/dL — ABNORMAL LOW (ref 31.5–35.7)
MCV: 66 fL — ABNORMAL LOW (ref 79–97)
Platelets: 318 10*3/uL (ref 150–450)
RBC: 6.4 x10E6/uL — ABNORMAL HIGH (ref 3.77–5.28)
RDW: 19.9 % — ABNORMAL HIGH (ref 11.7–15.4)
WBC: 6.5 10*3/uL (ref 3.4–10.8)

## 2022-04-01 LAB — LIPID PANEL
Chol/HDL Ratio: 2 ratio (ref 0.0–4.4)
Cholesterol, Total: 166 mg/dL (ref 100–199)
HDL: 81 mg/dL (ref >39)
LDL Chol Calc (NIH): 70 mg/dL (ref 0–99)
Triglycerides: 84 mg/dL (ref 0–149)
VLDL Cholesterol Cal: 15 mg/dL (ref 5–40)

## 2022-04-01 LAB — VITAMIN D 25 HYDROXY (VIT D DEFICIENCY, FRACTURES): Vit D, 25-Hydroxy: 54.8 ng/mL (ref 30.0–100.0)

## 2022-04-02 ENCOUNTER — Telehealth: Payer: Medicare HMO

## 2022-04-02 ENCOUNTER — Ambulatory Visit (INDEPENDENT_AMBULATORY_CARE_PROVIDER_SITE_OTHER): Payer: Medicare PPO

## 2022-04-02 DIAGNOSIS — I10 Essential (primary) hypertension: Secondary | ICD-10-CM

## 2022-04-02 DIAGNOSIS — R7303 Prediabetes: Secondary | ICD-10-CM

## 2022-04-02 DIAGNOSIS — G8929 Other chronic pain: Secondary | ICD-10-CM

## 2022-04-05 NOTE — Chronic Care Management (AMB) (Signed)
?Chronic Care Management  ? ?CCM RN Visit Note ? ?04/02/2022 ?Name: Linda Mccoy MRN: [id number] DOB: 11/23/1951 ? ?Subjective: ?Linda Mccoy is a 71 y.o. year old female who is a primary care patient of Moore, Janece, FNP. The care management team was consulted for assistance with disease management and care coordination needs.   ? ?Engaged with patient by telephone for follow up visit in response to provider referral for case management and/or care coordination services.  ? ?Consent to Services:  ?The patient was given information about Chronic Care Management services, agreed to services, and gave verbal consent prior to initiation of services.  Please see initial visit note for detailed documentation.  ? ?Patient agreed to services and verbal consent obtained.  ? ?Assessment: Review of patient past medical history, allergies, medications, health status, including review of consultants reports, laboratory and other test data, was performed as part of comprehensive evaluation and provision of chronic care management services.  ? ?SDOH (Social Determinants of Health) assessments and interventions performed:  Yes, no acute challenges ? ?CCM Care Plan ? ?Allergies  ?Allergen Reactions  ? Penicillins Hives  ? ? ?Outpatient Encounter Medications as of 04/02/2022  ?Medication Sig  ? aspirin EC 81 MG tablet Take 81 mg by mouth daily.  ? Blood Glucose Monitoring Suppl (TRUE METRIX METER) DEVI Use to check blood sugars once daily R73.03  ? Cholecalciferol (VITAMIN D) 2000 UNITS CAPS Take 2,000 Units by mouth daily.  ? diclofenac Sodium (VOLTAREN) 1 % GEL Apply 2 g topically 4 (four) times daily.  ? glucose blood (TRUE METRIX BLOOD GLUCOSE TEST) test strip Use as instructed to check blood sugars once daily R73.03  ? lisinopril-hydrochlorothiazide (ZESTORETIC) 20-25 MG tablet TAKE 1 TABLET EVERY DAY  ? metoprolol tartrate (LOPRESSOR) 25 MG tablet Take 1 tablet (25 mg total) by mouth 2 (two) times daily. (Patient not  taking: Reported on 12/03/2020)  ? Semaglutide-Weight Management (WEGOVY) 0.25 MG/0.5ML SOAJ Inject 0.25 mg into the skin once a week.  ? TRUEplus Lancets 33G MISC Check blood sugars once daily R73.03  ? ?No facility-administered encounter medications on file as of 04/02/2022.  ? ? ?Patient Active Problem List  ? Diagnosis Date Noted  ? Palpitations 02/26/2020  ? Prediabetes 02/12/2019  ? Vitamin D deficiency 11/29/2018  ? Multiple thyroid nodules 11/13/2018  ? Postmenopausal bleeding 05/30/2014  ? Anemia 05/30/2014  ? Obesity, unspecified 05/30/2014  ? Unspecified sleep apnea 05/30/2014  ? Endometrial polyp 05/30/2014  ? Hypertension 02/05/2013  ? ? ?Conditions to be addressed/monitored: HTN, Obesity, Prediabetes, Chronic pain  ? ?Care Plan : RN Care Manager Plan of Care  ?Updates made by Little, Angel L, RN since 04/02/2022 12:00 AM  ?  ? ?Problem: Chronic disease education and Care Coordination needs for HTN, Obesity, Prediabetes, Chronic pain   ?Priority: High  ?  ? ?Long-Range Goal: Assist with Chronic disease education and Care Coordination needs for HTN, Obesity, Prediabetes, Chronic pain   ?Start Date: 10/05/2021  ?Expected End Date: 10/05/2022  ?Recent Progress: On track  ?Priority: High  ?Note:   ?Current Barriers:  ?Knowledge Deficits related to plan of care for management of HTN, Obesity, Prediabetes, Chronic pain  ?Chronic Disease Management support and education needs related to  HTN, Obesity, Prediabetes, Chronic pain  ? ?RNCM Clinical Goal(s):  ?Patient will demonstrate Ongoing health management independence   ?continue to work with RN Care Manager to address care management and care coordination needs related to   HTN, Obesity, Prediabetes, Chronic pain  ?  will demonstrate ongoing self health care management ability    through collaboration with RN Care manager, provider, and care team.  ? ?Interventions: ?1:1 collaboration with primary care provider regarding development and update of comprehensive  plan of care as evidenced by provider attestation and co-signature ?Inter-disciplinary care team collaboration (see longitudinal plan of care) ?Evaluation of current treatment plan related to  self management and patient's adherence to plan as established by provider ? ?Pain Interventions: (Status: Condition stable. Not addressed this visit.) ?Pain assessment performed ?Medications reviewed ?Reviewed provider established plan for pain management; ?Reviewed with patient prescribed pharmacological and nonpharmacological pain relief strategies; essential oils and moist heat (Peppermint) for pain management  ?Discussed plans with patient for ongoing care management follow up and provided patient with direct contact information for care management team ? ? ?Hypertension Interventions:  (Status:  Goal on track:  Yes.) Long Term Goal ?Last practice recorded BP readings:  ?BP Readings from Last 3 Encounters:  ?03/31/22 132/80  ?12/29/21 126/84  ?09/21/21 132/80  ?Most recent eGFR/CrCl:  ?Lab Results  ?Component Value Date  ? EGFR 90 03/31/2022  ?  No components found for: CRCL ?Evaluation of current treatment plan related to hypertension self management and patient's adherence to plan as established by provider ?Counseled on the importance of exercise goals with target of 150 minutes per week ?Advised patient, providing education and rationale, to monitor blood pressure daily and record, calling PCP for findings outside established parameters ?Provided education on prescribed diet low Sodium ?Discussed complications of poorly controlled blood pressure such as heart disease, stroke, circulatory complications, vision complications, kidney impairment, sexual dysfunction ? Mailed printed educational material related to How to accurately monitor BP at home ? ?Prediabetes Interventions:  (Status:  Goal on track:  Yes.) Long Term Goal ?Assessed patient's understanding of A1c goal:  <5.7 % ?Review of patient status, including  review of consultants reports, relevant laboratory and other test results, and medications completed ?Educated patient on dietary and exercise recommendations ?Mailed printed educational materials related to prediabetes management ?Lab Results  ?Component Value Date  ? HGBA1C 5.9 (H) 03/31/2022  ? ?Impaired Urinary Elimination: Goal Met. ?Evaluation of current treatment plan related to  Impaired Urinary Elimination , self-management and patient's adherence to plan as established by provider. ?Determined patient started Cranberry juice and AZO Cranberry, she is using as directed and reports her urinary frequency has improved ?Educated patient on importance of keeping record of triggers that may contribute to urinary frequency  ?Determined patient feels her symptoms have resolved, she denies having concerns at this time  ?Instructed patient to notify PCP promptly of any/all recurrent symptoms of impaired urinary elimination  ?Discussed plans with patient for ongoing care management follow up and provided patient with direct contact information for care management team  ? ?Obesity:  (Status:  New goal.)  Long Term Goal ?Evaluation of current treatment plan related to  Obesity ,  self-management and patient's adherence to plan as established by provider ?Review of patient status, including review of consultant's reports, relevant laboratory and other test results, and medications completed. ?Reviewed medications with patient and discussed importance of medication adherence ?Discussed PCP Rx for Wegovy; educated patient regarding the indication, dosage and frequency of this medication per Manufacturer PI ?Determined patient plans to contact her health plan to confirm that Wegovy is a covered medication, and will ask if a PA is needed ?Educated patient on dietary and exercise recommendations to help lose weight and reach improved BMI ?Encouraged patient to set   a realistic and achievable goal to ensure she can be  successful ?Engineer, civil (consulting) related to Chair Exercises and How to get started with losing weight ?Discussed plans with patient for ongoing care management follow up and provided patient with direct contact i

## 2022-04-05 NOTE — Patient Instructions (Signed)
Visit Information ? ?Thank you for taking time to visit with me today. Please don't hesitate to contact me if I can be of assistance to you before our next scheduled telephone appointment. ? ?Following are the goals we discussed today:  ?(Copy and paste patient goals from clinical care plan here) ? ?Our next appointment is by telephone on 06/21/22 at 12:45 PM ? ?Please call the care guide team at [phone] if you need to cancel or reschedule your appointment.  ? ?If you are experiencing a Mental Health or [place] or need someone to talk to, please call 1-800-273-TALK (toll free, 24 hour hotline)  ? ?Patient verbalizes understanding of instructions and care plan provided today and agrees to view in [place]. Active MyChart status confirmed with patient.   ? ?Barb Merino, RN, BSN, CCM ?Care Management Coordinator ?[place] [facility]  ?Direct Phone: [phone] ? ? ?

## 2022-04-11 DIAGNOSIS — I10 Essential (primary) hypertension: Secondary | ICD-10-CM

## 2022-04-30 ENCOUNTER — Ambulatory Visit (INDEPENDENT_AMBULATORY_CARE_PROVIDER_SITE_OTHER): Payer: Medicare PPO

## 2022-04-30 ENCOUNTER — Telehealth: Payer: Medicare PPO

## 2022-04-30 DIAGNOSIS — R7303 Prediabetes: Secondary | ICD-10-CM

## 2022-04-30 DIAGNOSIS — G8929 Other chronic pain: Secondary | ICD-10-CM

## 2022-04-30 DIAGNOSIS — I10 Essential (primary) hypertension: Secondary | ICD-10-CM

## 2022-04-30 NOTE — Chronic Care Management (AMB) (Signed)
Chronic Care Management   CCM RN Visit Note  04/30/2022 Name: Linda Mccoy MRN: [id number] DOB: 1950-12-18  Subjective: Linda Mccoy is a 71 y.o. year old female who is a primary care patient of Minette Brine, [place]. The care management team was consulted for assistance with disease management and care coordination needs.    Engaged with patient by telephone for follow up visit in response to provider referral for case management and/or care coordination services.   Consent to Services:  The patient was given information about Chronic Care Management services, agreed to services, and gave verbal consent prior to initiation of services.  Please see initial visit note for detailed documentation.   Patient agreed to services and verbal consent obtained.   Assessment: Review of patient past medical history, allergies, medications, health status, including review of consultants reports, laboratory and other test data, was performed as part of comprehensive evaluation and provision of chronic care management services.   SDOH (Social Determinants of Health) assessments and interventions performed:  Yes, no acute changes   CCM Care Plan  Allergies  Allergen Reactions   Penicillins Hives    Outpatient Encounter Medications as of 04/30/2022  Medication Sig   aspirin EC 81 MG tablet Take 81 mg by mouth daily.   Blood Glucose Monitoring Suppl (TRUE METRIX METER) DEVI Use to check blood sugars once daily R73.03   Cholecalciferol (VITAMIN D) 2000 UNITS CAPS Take 2,000 Units by mouth daily.   diclofenac Sodium (VOLTAREN) 1 % GEL Apply 2 g topically 4 (four) times daily.   glucose blood (TRUE METRIX BLOOD GLUCOSE TEST) test strip Use as instructed to check blood sugars once daily R73.03   lisinopril-hydrochlorothiazide (ZESTORETIC) 20-25 MG tablet TAKE 1 TABLET EVERY DAY   metoprolol tartrate (LOPRESSOR) 25 MG tablet Take 1 tablet (25 mg total) by mouth 2 (two) times daily. (Patient not taking:  Reported on 12/03/2020)   Semaglutide-Weight Management (WEGOVY) 0.25 MG/0.5ML SOAJ Inject 0.25 mg into the skin once a week.   TRUEplus Lancets 33G MISC Check blood sugars once daily R73.03   No facility-administered encounter medications on file as of 04/30/2022.    Patient Active Problem List   Diagnosis Date Noted   Palpitations 02/26/2020   Prediabetes 02/12/2019   Vitamin D deficiency 11/29/2018   Multiple thyroid nodules 11/13/2018   Postmenopausal bleeding 05/30/2014   Anemia 05/30/2014   Obesity, unspecified 05/30/2014   Unspecified sleep apnea 05/30/2014   Endometrial polyp 05/30/2014   Hypertension 02/05/2013    Conditions to be addressed/monitored: HTN, Obesity, Prediabetes, Chronic pain   Care Plan : RN Care Manager Plan of Care  Updates made by Lynne Logan, RN since 04/30/2022 12:00 AM     Problem: Chronic disease education and Care Coordination needs for HTN, Obesity, Prediabetes, Chronic pain   Priority: High     Long-Range Goal: Assist with Chronic disease education and Care Coordination needs for HTN, Obesity, Prediabetes, Chronic pain   Start Date: 10/05/2021  Expected End Date: 10/05/2022  Recent Progress: On track  Priority: High  Note:   Current Barriers:  Knowledge Deficits related to plan of care for management of HTN, Obesity, Prediabetes, Chronic pain  Chronic Disease Management support and education needs related to  HTN, Obesity, Prediabetes, Chronic pain   RNCM Clinical Goal(s):  Patient will demonstrate Ongoing health management independence   continue to work with RN Care Manager to address care management and care coordination needs related to   HTN, Obesity, Prediabetes, Chronic  pain  will demonstrate ongoing self health care management ability    through collaboration with RN Care manager, provider, and care team.   Interventions: 1:1 collaboration with primary care provider regarding development and update of comprehensive plan of  care as evidenced by provider attestation and co-signature Inter-disciplinary care team collaboration (see longitudinal plan of care) Evaluation of current treatment plan related to  self management and patient's adherence to plan as established by provider  Pain Interventions: (Status: Condition stable. Not addressed this visit.) Pain assessment performed Medications reviewed Reviewed provider established plan for pain management; Reviewed with patient prescribed pharmacological and nonpharmacological pain relief strategies; essential oils and moist heat (Peppermint) for pain management  Discussed plans with patient for ongoing care management follow up and provided patient with direct contact information for care management team   Hypertension Interventions:  (Status:  Condition stable.  Not addressed this visit.) Long Term Goal Last practice recorded BP readings:  BP Readings from Last 3 Encounters:  03/31/22 132/80  12/29/21 126/84  09/21/21 132/80  Most recent eGFR/CrCl:  Lab Results  Component Value Date   EGFR 90 03/31/2022    No components found for: CRCL Evaluation of current treatment plan related to hypertension self management and patient's adherence to plan as established by provider Counseled on the importance of exercise goals with target of 150 minutes per week Advised patient, providing education and rationale, to monitor blood pressure daily and record, calling PCP for findings outside established parameters Provided education on prescribed diet low Sodium Discussed complications of poorly controlled blood pressure such as heart disease, stroke, circulatory complications, vision complications, kidney impairment, sexual dysfunction  Mailed printed educational material related to How to accurately monitor BP at home  Prediabetes Interventions:  (Status:  Condition stable.  Not addressed this visit.) Long Term Goal Assessed patient's understanding of A1c goal:  <5.7  % Review of patient status, including review of consultants reports, relevant laboratory and other test results, and medications completed Educated patient on dietary and exercise recommendations Mailed printed educational materials related to prediabetes management Lab Results  Component Value Date   HGBA1C 5.9 (H) 03/31/2022   Impaired Urinary Elimination: Goal Met. Evaluation of current treatment plan related to  Impaired Urinary Elimination , self-management and patient's adherence to plan as established by provider. Determined patient started Cranberry juice and AZO Cranberry, she is using as directed and reports her urinary frequency has improved Educated patient on importance of keeping record of triggers that may contribute to urinary frequency  Determined patient feels her symptoms have resolved, she denies having concerns at this time  Instructed patient to notify PCP promptly of any/all recurrent symptoms of impaired urinary elimination  Discussed plans with patient for ongoing care management follow up and provided patient with direct contact information for care management team   Obesity:  (Status:  Goal on track:  Yes.)  Long Term Goal Evaluation of current treatment plan related to  Obesity ,  self-management and patient's adherence to plan as established by provider Determined Mancel Parsons is not covered by patient's Medicare plan Determined patient contacted her health plan and was advised Darcel Bayley is covered Discussed patient would like PCP to send new Rx for this medication to her preferred pharmacy Educated patient on PA process and advised this medication may require a PA from her PCP provider Determined patient has not tried other medications in the past for weight loss Educated patient on the indication usage and dosing of this medication, mailed printed PI  from [employer] for patient's review Sent in basket message to PCP Minette Brine FNP requesting an Rx for [facility] be sent  to patient's preferred pharmacy Discussed patient would like to come into the office for training on how to inject this medication once approved Discussed plans with patient for ongoing care management follow up and provided patient with direct contact information for care management team BMI 41.59 kg/m/BSA 2.27 m  Wt Readings from Last 3 Encounters: 12/29/21 : 244 lb (110.7 kg) 09/21/21 : 244 lb 3.2 oz (110.8 kg) 06/11/21 : 243 lb 13.3 oz (110.6 kg)   Patient Goals/Self-Care Activities: Take all medications as prescribed Attend all scheduled provider appointments Call pharmacy for medication refills 3-7 days in advance of running out of medications Perform all self care activities independently  Perform IADL's (shopping, preparing meals, housekeeping, managing finances) independently Call provider office for new concerns or questions  drink 6 to 8 glasses of water each day manage portion size check blood pressure 3 times per week take blood pressure log to all doctor appointments call doctor for signs and symptoms of high blood pressure take medications for blood pressure exactly as prescribed report new symptoms to your doctor  Follow Up Plan:  Telephone follow up appointment with care management team member scheduled for:  06/21/22     Barb Merino, RN, BSN, CCM Care Management Coordinator [place] [facility]  Direct Phone: [phone]

## 2022-04-30 NOTE — Patient Instructions (Signed)
Visit Information  Thank you for taking time to visit with me today. Please don't hesitate to contact me if I can be of assistance to you before our next scheduled telephone appointment.  Following are the goals we discussed today:  (Copy and paste patient goals from clinical care plan here)  Our next appointment is by telephone on 06/21/22 at 12:45 PM   Please call the care guide team at [phone] if you need to cancel or reschedule your appointment.   If you are experiencing a Mental Health or [place] or need someone to talk to, please call 1-800-273-TALK (toll free, 24 hour hotline)   Patient verbalizes understanding of instructions and care plan provided today and agrees to view in [place]. Active MyChart status and patient understanding of how to access instructions and care plan via MyChart confirmed with patient.     Barb Merino, RN, BSN, CCM Care Management Coordinator [place] [facility]  Direct Phone: [phone]

## 2022-05-05 ENCOUNTER — Ambulatory Visit: Payer: Self-pay

## 2022-05-05 ENCOUNTER — Telehealth: Payer: Medicare PPO

## 2022-05-05 DIAGNOSIS — I10 Essential (primary) hypertension: Secondary | ICD-10-CM

## 2022-05-05 DIAGNOSIS — R7303 Prediabetes: Secondary | ICD-10-CM

## 2022-05-05 DIAGNOSIS — G8929 Other chronic pain: Secondary | ICD-10-CM

## 2022-05-05 NOTE — Chronic Care Management (AMB) (Signed)
Chronic Care Management   CCM RN Visit Note  05/05/2022 Name: Linda Mccoy MRN: [id number] DOB: 04-16-1951  Subjective: Linda Mccoy is a 71 y.o. year old female who is a primary care patient of Minette Brine, [place]. The care management team was consulted for assistance with disease management and care coordination needs.    Engaged with patient by telephone for follow up visit in response to provider referral for case management and/or care coordination services.   Consent to Services:  The patient was given information about Chronic Care Management services, agreed to services, and gave verbal consent prior to initiation of services.  Please see initial visit note for detailed documentation.   Patient agreed to services and verbal consent obtained.   Assessment: Review of patient past medical history, allergies, medications, health status, including review of consultants reports, laboratory and other test data, was performed as part of comprehensive evaluation and provision of chronic care management services.   SDOH (Social Determinants of Health) assessments and interventions performed:  no acute needs  CCM Care Plan  Allergies  Allergen Reactions   Penicillins Hives    Outpatient Encounter Medications as of 05/05/2022  Medication Sig   aspirin EC 81 MG tablet Take 81 mg by mouth daily.   Blood Glucose Monitoring Suppl (TRUE METRIX METER) DEVI Use to check blood sugars once daily R73.03   Cholecalciferol (VITAMIN D) 2000 UNITS CAPS Take 2,000 Units by mouth daily.   diclofenac Sodium (VOLTAREN) 1 % GEL Apply 2 g topically 4 (four) times daily.   glucose blood (TRUE METRIX BLOOD GLUCOSE TEST) test strip Use as instructed to check blood sugars once daily R73.03   lisinopril-hydrochlorothiazide (ZESTORETIC) 20-25 MG tablet TAKE 1 TABLET EVERY DAY   metoprolol tartrate (LOPRESSOR) 25 MG tablet Take 1 tablet (25 mg total) by mouth 2 (two) times daily. (Patient not taking:  Reported on 12/03/2020)   Semaglutide-Weight Management (WEGOVY) 0.25 MG/0.5ML SOAJ Inject 0.25 mg into the skin once a week.   TRUEplus Lancets 33G MISC Check blood sugars once daily R73.03   No facility-administered encounter medications on file as of 05/05/2022.    Patient Active Problem List   Diagnosis Date Noted   Palpitations 02/26/2020   Prediabetes 02/12/2019   Vitamin D deficiency 11/29/2018   Multiple thyroid nodules 11/13/2018   Postmenopausal bleeding 05/30/2014   Anemia 05/30/2014   Obesity, unspecified 05/30/2014   Unspecified sleep apnea 05/30/2014   Endometrial polyp 05/30/2014   Hypertension 02/05/2013    Conditions to be addressed/monitored: HTN, Obesity, Prediabetes, Chronic pain   Care Plan : RN Care Manager Plan of Care  Updates made by Lynne Logan, RN since 05/05/2022 12:00 AM     Problem: Chronic disease education and Care Coordination needs for HTN, Obesity, Prediabetes, Chronic pain   Priority: High     Long-Range Goal: Assist with Chronic disease education and Care Coordination needs for HTN, Obesity, Prediabetes, Chronic pain   Start Date: 10/05/2021  Expected End Date: 10/05/2022  Recent Progress: On track  Priority: High  Note:   Current Barriers:  Knowledge Deficits related to plan of care for management of HTN, Obesity, Prediabetes, Chronic pain  Chronic Disease Management support and education needs related to  HTN, Obesity, Prediabetes, Chronic pain   RNCM Clinical Goal(s):  Patient will demonstrate Ongoing health management independence   continue to work with RN Care Manager to address care management and care coordination needs related to   HTN, Obesity, Prediabetes, Chronic pain  will demonstrate ongoing self health care management ability    through collaboration with RN Care manager, provider, and care team.   Interventions: 1:1 collaboration with primary care provider regarding development and update of comprehensive plan of  care as evidenced by provider attestation and co-signature Inter-disciplinary care team collaboration (see longitudinal plan of care) Evaluation of current treatment plan related to  self management and patient's adherence to plan as established by provider  Pain Interventions: (Status: Condition stable. Not addressed this visit.) Pain assessment performed Medications reviewed Reviewed provider established plan for pain management; Reviewed with patient prescribed pharmacological and nonpharmacological pain relief strategies; essential oils and moist heat (Peppermint) for pain management  Discussed plans with patient for ongoing care management follow up and provided patient with direct contact information for care management team  Hypertension Interventions:  (Status:  Condition stable.  Not addressed this visit.) Long Term Goal Last practice recorded BP readings:  BP Readings from Last 3 Encounters:  03/31/22 132/80  12/29/21 126/84  09/21/21 132/80  Most recent eGFR/CrCl:  Lab Results  Component Value Date   EGFR 90 03/31/2022    No components found for: CRCL Evaluation of current treatment plan related to hypertension self management and patient's adherence to plan as established by provider Counseled on the importance of exercise goals with target of 150 minutes per week Advised patient, providing education and rationale, to monitor blood pressure daily and record, calling PCP for findings outside established parameters Provided education on prescribed diet low Sodium Discussed complications of poorly controlled blood pressure such as heart disease, stroke, circulatory complications, vision complications, kidney impairment, sexual dysfunction  Mailed printed educational material related to How to accurately monitor BP at home  Prediabetes Interventions:  (Status:  Condition stable.  Not addressed this visit.) Long Term Goal Assessed patient's understanding of A1c goal:  <5.7  % Review of patient status, including review of consultants reports, relevant laboratory and other test results, and medications completed Educated patient on dietary and exercise recommendations Mailed printed educational materials related to prediabetes management Lab Results  Component Value Date   HGBA1C 5.9 (H) 03/31/2022  Impaired Urinary Elimination: (Status: Goal Met.) Short Term Goal  Evaluation of current treatment plan related to  Impaired Urinary Elimination , self-management and patient's adherence to plan as established by provider. Determined patient started Cranberry juice and AZO Cranberry, she is using as directed and reports her urinary frequency has improved Educated patient on importance of keeping record of triggers that may contribute to urinary frequency  Determined patient feels her symptoms have resolved, she denies having concerns at this time  Instructed patient to notify PCP promptly of any/all recurrent symptoms of impaired urinary elimination  Discussed plans with patient for ongoing care management follow up and provided patient with direct contact information for care management team   Obesity:  (Status:  Goal on track:  Yes.)  Long Term Goal Evaluation of current treatment plan related to  Obesity ,  self-management and patient's adherence to plan as established by provider Determined Mancel Parsons is not covered by patient's Medicare plan Determined patient contacted her health plan and was advised Darcel Bayley is covered Collaborated with PCP provider regarding patient eligibility for start of Mounjaro Determined patient is not eligible for use of Mounjaro due to having low A1c of 5.9, patient is aware Discussed the benefits of having patient work with a nutritionist to help with meal planning, patient will contact her health plan to inquire about her benefits for this provider  and will discuss with PCP at next visit Mailed printed educational materials to patient  related to Nutritional support and how to get started with her weight loss goals Discussed plans with patient for ongoing care management follow up and provided patient with direct contact information for care management team BMI 41.59 kg/m/BSA 2.27 m  Wt Readings from Last 3 Encounters: 12/29/21 : 244 lb (110.7 kg) 09/21/21 : 244 lb 3.2 oz (110.8 kg) 06/11/21 : 243 lb 13.3 oz (110.6 kg)   Patient Goals/Self-Care Activities: Take all medications as prescribed Attend all scheduled provider appointments Call pharmacy for medication refills 3-7 days in advance of running out of medications Perform all self care activities independently  Perform IADL's (shopping, preparing meals, housekeeping, managing finances) independently Call provider office for new concerns or questions  drink 6 to 8 glasses of water each day manage portion size check blood pressure 3 times per week take blood pressure log to all doctor appointments call doctor for signs and symptoms of high blood pressure take medications for blood pressure exactly as prescribed report new symptoms to your doctor  Follow Up Plan:  Telephone follow up appointment with care management team member scheduled for:  06/21/22     Barb Merino, RN, BSN, CCM Care Management Coordinator [place] [facility]  Direct Phone: [phone]

## 2022-05-05 NOTE — Patient Instructions (Signed)
Visit Information  Thank you for taking time to visit with me today. Please don't hesitate to contact me if I can be of assistance to you before our next scheduled telephone appointment.  Following are the goals we discussed today:  (Copy and paste patient goals from clinical care plan here)  Our next appointment is by telephone on 06/21/22 at 12:45 PM   Please call the care guide team at [phone] if you need to cancel or reschedule your appointment.   If you are experiencing a Mental Health or [place] or need someone to talk to, please call 1-800-273-TALK (toll free, 24 hour hotline)   Patient verbalizes understanding of instructions and care plan provided today and agrees to view in [place]. Active MyChart status and patient understanding of how to access instructions and care plan via MyChart confirmed with patient.     Barb Merino, RN, BSN, CCM Care Management Coordinator [place] [facility]  Direct Phone: [phone]

## 2022-05-12 DIAGNOSIS — I10 Essential (primary) hypertension: Secondary | ICD-10-CM

## 2022-06-17 ENCOUNTER — Encounter: Payer: Self-pay | Admitting: Nurse Practitioner

## 2022-06-17 ENCOUNTER — Ambulatory Visit (INDEPENDENT_AMBULATORY_CARE_PROVIDER_SITE_OTHER): Payer: Medicare PPO

## 2022-06-17 ENCOUNTER — Ambulatory Visit (INDEPENDENT_AMBULATORY_CARE_PROVIDER_SITE_OTHER): Payer: Medicare PPO | Admitting: Nurse Practitioner

## 2022-06-17 VITALS — BP 142/90 | HR 90 | Temp 97.9°F | Ht 60.0 in | Wt 242.0 lb

## 2022-06-17 VITALS — BP 140/84 | HR 90 | Temp 97.9°F | Ht 65.6 in | Wt 252.8 lb

## 2022-06-17 DIAGNOSIS — R7303 Prediabetes: Secondary | ICD-10-CM | POA: Diagnosis not present

## 2022-06-17 DIAGNOSIS — Z6841 Body Mass Index (BMI) 40.0 and over, adult: Secondary | ICD-10-CM

## 2022-06-17 DIAGNOSIS — Z87898 Personal history of other specified conditions: Secondary | ICD-10-CM

## 2022-06-17 DIAGNOSIS — I119 Hypertensive heart disease without heart failure: Secondary | ICD-10-CM | POA: Diagnosis not present

## 2022-06-17 DIAGNOSIS — Z0000 Encounter for general adult medical examination without abnormal findings: Secondary | ICD-10-CM

## 2022-06-17 LAB — POCT URINALYSIS DIPSTICK
Bilirubin, UA: NEGATIVE
Glucose, UA: NEGATIVE
Ketones, UA: NEGATIVE
Leukocytes, UA: NEGATIVE
Nitrite, UA: NEGATIVE
Protein, UA: NEGATIVE
Spec Grav, UA: 1.01 (ref 1.010–1.025)
Urobilinogen, UA: 0.2 E.U./dL
pH, UA: 6 (ref 5.0–8.0)

## 2022-06-17 MED ORDER — METOPROLOL TARTRATE 25 MG PO TABS: 25.0000 mg | ORAL_TABLET | Freq: Every day | ORAL | 2 refills | Status: DC

## 2022-06-17 NOTE — Patient Instructions (Addendum)
Linda Mccoy , Thank you for taking time to come for your Medicare Wellness Visit. I appreciate your ongoing commitment to your health goals. Please review the following plan we discussed and let me know if I can assist you in the future.   Screening recommendations/referrals: Colonoscopy: completed 08/31/2019, due 08/30/2029 Mammogram: completed 03/19/2022, due 03/21/2023 Bone Density: completed 03/19/2022 Recommended yearly ophthalmology/optometry visit for glaucoma screening and checkup Recommended yearly dental visit for hygiene and checkup  Vaccinations: Influenza vaccine: due 07/13/2022 Pneumococcal vaccine: completed 08/11/2021 Tdap vaccine: completed 03/31/2022, due 03/31/2032 Shingles vaccine: discussed   Covid-19:05/11/2022, 09/10/2021, 04/16/2021, 10/07/2020, 02/19/2020, 01/25/2020  Advanced directives: Advance directive discussed with you today. Even though you declined this today please call our office should you change your mind and we can give you the proper paperwork for you to fill out.  Conditions/risks identified: none  Next appointment: Follow up in one year for your annual wellness visit    Preventive Care 65 Years and Older, Female Preventive care refers to lifestyle choices and visits with your health care provider that can promote health and wellness. What does preventive care include? A yearly physical exam. This is also called an annual well check. Dental exams once or twice a year. Routine eye exams. Ask your health care provider how often you should have your eyes checked. Personal lifestyle choices, including: Daily care of your teeth and gums. Regular physical activity. Eating a healthy diet. Avoiding tobacco and drug use. Limiting alcohol use. Practicing safe sex. Taking low-dose aspirin every day. Taking vitamin and mineral supplements as recommended by your health care provider. What happens during an annual well check? The services and screenings done by your  health care provider during your annual well check will depend on your age, overall health, lifestyle risk factors, and family history of disease. Counseling  Your health care provider may ask you questions about your: Alcohol use. Tobacco use. Drug use. Emotional well-being. Home and relationship well-being. Sexual activity. Eating habits. History of falls. Memory and ability to understand (cognition). Work and work Statistician. Reproductive health. Screening  You may have the following tests or measurements: Height, weight, and BMI. Blood pressure. Lipid and cholesterol levels. These may be checked every 5 years, or more frequently if you are over 66 years old. Skin check. Lung cancer screening. You may have this screening every year starting at age 65 if you have a 30-pack-year history of smoking and currently smoke or have quit within the past 15 years. Fecal occult blood test (FOBT) of the stool. You may have this test every year starting at age 93. Flexible sigmoidoscopy or colonoscopy. You may have a sigmoidoscopy every 5 years or a colonoscopy every 10 years starting at age 33. Hepatitis C blood test. Hepatitis B blood test. Sexually transmitted disease (STD) testing. Diabetes screening. This is done by checking your blood sugar (glucose) after you have not eaten for a while (fasting). You may have this done every 1-3 years. Bone density scan. This is done to screen for osteoporosis. You may have this done starting at age 23. Mammogram. This may be done every 1-2 years. Talk to your health care provider about how often you should have regular mammograms. Talk with your health care provider about your test results, treatment options, and if necessary, the need for more tests. Vaccines  Your health care provider may recommend certain vaccines, such as: Influenza vaccine. This is recommended every year. Tetanus, diphtheria, and acellular pertussis (Tdap, Td) vaccine. You may  need a Td booster every 10 years. Zoster vaccine. You may need this after age 42. Pneumococcal 13-valent conjugate (PCV13) vaccine. One dose is recommended after age 10. Pneumococcal polysaccharide (PPSV23) vaccine. One dose is recommended after age 99. Talk to your health care provider about which screenings and vaccines you need and how often you need them. This information is not intended to replace advice given to you by your health care provider. Make sure you discuss any questions you have with your health care provider. Document Released: 12/26/2015 Document Revised: 08/18/2016 Document Reviewed: 09/30/2015 Elsevier Interactive Patient Education  2017 [place] Prevention in the Home Falls can cause injuries. They can happen to people of all ages. There are many things you can do to make your home safe and to help prevent falls. What can I do on the outside of my home? Regularly fix the edges of walkways and driveways and fix any cracks. Remove anything that might make you trip as you walk through a door, such as a raised step or threshold. Trim any bushes or trees on the path to your home. Use bright outdoor lighting. Clear any walking paths of anything that might make someone trip, such as rocks or tools. Regularly check to see if handrails are loose or broken. Make sure that both sides of any steps have handrails. Any raised decks and porches should have guardrails on the edges. Have any leaves, snow, or ice cleared regularly. Use sand or salt on walking paths during winter. Clean up any spills in your garage right away. This includes oil or grease spills. What can I do in the bathroom? Use night lights. Install grab bars by the toilet and in the tub and shower. Do not use towel bars as grab bars. Use non-skid mats or decals in the tub or shower. If you need to sit down in the shower, use a plastic, non-slip stool. Keep the floor dry. Clean up any water that spills on  the floor as soon as it happens. Remove soap buildup in the tub or shower regularly. Attach bath mats securely with double-sided non-slip rug tape. Do not have throw rugs and other things on the floor that can make you trip. What can I do in the bedroom? Use night lights. Make sure that you have a light by your bed that is easy to reach. Do not use any sheets or blankets that are too big for your bed. They should not hang down onto the floor. Have a firm chair that has side arms. You can use this for support while you get dressed. Do not have throw rugs and other things on the floor that can make you trip. What can I do in the kitchen? Clean up any spills right away. Avoid walking on wet floors. Keep items that you use a lot in easy-to-reach places. If you need to reach something above you, use a strong step stool that has a grab bar. Keep electrical cords out of the way. Do not use floor polish or wax that makes floors slippery. If you must use wax, use non-skid floor wax. Do not have throw rugs and other things on the floor that can make you trip. What can I do with my stairs? Do not leave any items on the stairs. Make sure that there are handrails on both sides of the stairs and use them. Fix handrails that are broken or loose. Make sure that handrails are as long as the stairways. Check  any carpeting to make sure that it is firmly attached to the stairs. Fix any carpet that is loose or worn. Avoid having throw rugs at the top or bottom of the stairs. If you do have throw rugs, attach them to the floor with carpet tape. Make sure that you have a light switch at the top of the stairs and the bottom of the stairs. If you do not have them, ask someone to add them for you. What else can I do to help prevent falls? Wear shoes that: Do not have high heels. Have rubber bottoms. Are comfortable and fit you well. Are closed at the toe. Do not wear sandals. If you use a stepladder: Make sure  that it is fully opened. Do not climb a closed stepladder. Make sure that both sides of the stepladder are locked into place. Ask someone to hold it for you, if possible. Clearly mark and make sure that you can see: Any grab bars or handrails. First and last steps. Where the edge of each step is. Use tools that help you move around (mobility aids) if they are needed. These include: Canes. Walkers. Scooters. Crutches. Turn on the lights when you go into a dark area. Replace any light bulbs as soon as they burn out. Set up your furniture so you have a clear path. Avoid moving your furniture around. If any of your floors are uneven, fix them. If there are any pets around you, be aware of where they are. Review your medicines with your doctor. Some medicines can make you feel dizzy. This can increase your chance of falling. Ask your doctor what other things that you can do to help prevent falls. This information is not intended to replace advice given to you by your health care provider. Make sure you discuss any questions you have with your health care provider. Document Released: 09/25/2009 Document Revised: 05/06/2016 Document Reviewed: 01/03/2015 Elsevier Interactive Patient Education  2017 [employer].

## 2022-06-17 NOTE — Progress Notes (Signed)
I,Linda Mccoy,acting as a Education administrator for [employer], FNP.,have documented all relevant documentation on the behalf of Linda Brine, FNP,as directed by  Linda Brine, FNP while in the presence of Linda Mccoy, Linda Mccoy.    Patient ID: Linda Mccoy , female    DOB: [birth year] , 71 y.o.   MRN: [id number]   Chief Complaint  Patient presents with   Hypertension    HPI  Linda Mccoy presents today for a follow up for her HTN.   Hypertension This is a chronic problem. The current episode started more than 1 year ago. The problem has been gradually improving since onset. The problem is controlled. Pertinent negatives include no anxiety, chest pain, headaches or palpitations. There are no associated agents to hypertension. Risk factors for coronary artery disease include obesity. Past treatments include beta blockers, calcium channel blockers and diuretics. The current treatment provides moderate improvement. There is no history of angina or kidney disease. There is no history of chronic renal disease.     Past Medical History:  Diagnosis Date   Anemia    History   Borderline diabetes    no meds   Depression    no meds   Endometrial polyp 05/30/2014   Fibroids    Goiter    History of blood transfusion    approx 20 yrs ago at [facility]   Hypertension    Multiple thyroid nodules 11/13/2018   Obesity, unspecified 05/30/2014   Postmenopausal bleeding 05/30/2014   Prediabetes 02/12/2019   Sleep apnea    Does not use CPAP   SVD (spontaneous vaginal delivery)    x 3   Unspecified sleep apnea 05/30/2014   Vitamin D deficiency 11/29/2018     Family History  Problem Relation Age of Onset   Diabetes Mother    Hypertension Mother    Alcohol abuse Mother    Breast cancer Neg Hx      Current Outpatient Medications:    aspirin EC 81 MG tablet, Take 81 mg by mouth daily., Disp: , Rfl:    Blood Glucose Monitoring Suppl (TRUE METRIX METER) DEVI, Use to check blood sugars once daily R73.03, Disp: 1 each,  Rfl: 1   Cholecalciferol (VITAMIN D) 2000 UNITS CAPS, Take 2,000 Units by mouth daily., Disp: , Rfl:    diclofenac Sodium (VOLTAREN) 1 % GEL, Apply 2 g topically 4 (four) times daily. (Patient not taking: Reported on 06/17/2022), Disp: 100 g, Rfl: 2   glucose blood (TRUE METRIX BLOOD GLUCOSE TEST) test strip, Use as instructed to check blood sugars once daily R73.03, Disp: 100 each, Rfl: 12   lisinopril-hydrochlorothiazide (ZESTORETIC) 20-25 MG tablet, TAKE 1 TABLET EVERY DAY, Disp: 90 tablet, Rfl: 1   metoprolol tartrate (LOPRESSOR) 25 MG tablet, Take 1 tablet (25 mg total) by mouth daily., Disp: 90 tablet, Rfl: 2   Semaglutide-Weight Management (WEGOVY) 0.25 MG/0.5ML SOAJ, Inject 0.25 mg into the skin once a week., Disp: 2 mL, Rfl: 0   TRUEplus Lancets 33G MISC, Check blood sugars once daily R73.03, Disp: 100 each, Rfl: 1   Allergies  Allergen Reactions   Penicillins Hives     Review of Systems  Constitutional: Negative.   Respiratory: Negative.    Cardiovascular: Negative.  Negative for chest pain and palpitations.  Gastrointestinal: Negative.   Neurological: Negative.  Negative for headaches.     Today's Vitals   06/17/22 1502  BP: (!) 142/90  Pulse: 90  Temp: 97.9 F (36.6 C)  TempSrc: Oral  Weight: 242 lb (  109.8 kg)  Height: 5' (1.524 m)   Body mass index is 47.26 kg/m.  Wt Readings from Last 3 Encounters:  06/17/22 242 lb (109.8 kg)  06/17/22 252 lb 12.8 oz (114.7 kg)  03/31/22 248 lb 6.4 oz (112.7 kg)    Objective:  Physical Exam Vitals reviewed.  Constitutional:      General: She is not in acute distress.    Appearance: Normal appearance. She is obese.  HENT:     Nose: No congestion.  Cardiovascular:     Rate and Rhythm: Normal rate and regular rhythm.     Pulses: Normal pulses.     Heart sounds: Normal heart sounds. No murmur heard. Pulmonary:     Effort: Pulmonary effort is normal. No respiratory distress.     Breath sounds: Normal breath sounds. No  wheezing.  Skin:    General: Skin is warm and dry.     Capillary Refill: Capillary refill takes less than 2 seconds.  Neurological:     General: No focal deficit present.     Mental Status: She is alert and oriented to person, place, and time.     Cranial Nerves: No cranial nerve deficit.     Motor: No weakness.  Psychiatric:        Mood and Affect: Mood normal.        Behavior: Behavior normal.        Thought Content: Thought content normal.        Judgment: Judgment normal.         Assessment And Plan:     1. Hypertensive heart disease without heart failure Comments: Blood pressure is elevated, will refer for nutrition education. Will also refer to exercise program. Repeat BP slightly better.  - Amb ref to Medical Nutrition Therapy-MNT - BMP8+EGFR - metoprolol tartrate (LOPRESSOR) 25 MG tablet; Take 1 tablet (25 mg total) by mouth daily.  Dispense: 90 tablet; Refill: 2 - Amb Referral To Provider Referral Exercise Program (P.R.E.P) - POCT Urinalysis Dipstick (81002)  2. Prediabetes - Amb ref to Medical Nutrition Therapy-MNT - Hemoglobin A1c - Amb Referral To Provider Referral Exercise Program (P.R.E.P) - POCT Urinalysis Dipstick (81002)  3. History of palpitations - metoprolol tartrate (LOPRESSOR) 25 MG tablet; Take 1 tablet (25 mg total) by mouth daily.  Dispense: 90 tablet; Refill: 2  4. Class 3 severe obesity due to excess calories with serious comorbidity and body mass index (BMI) of 45.0 to 49.9 in adult [facility]) She is encouraged to strive for BMI less than 30 to decrease cardiac risk. Advised to aim for at least 150 minutes of exercise per week. - Amb ref to Medical Nutrition Therapy-MNT - Amb Referral To Provider Referral Exercise Program (P.R.E.P)     Patient was given opportunity to ask questions. Patient verbalized understanding of the plan and was able to repeat key elements of the plan. All questions were answered to their satisfaction.  Linda Brine, FNP    I, Linda Brine, FNP, have reviewed all documentation for this visit. The documentation on 06/17/22 for the exam, diagnosis, procedures, and orders are all accurate and complete.   IF YOU HAVE BEEN REFERRED TO A SPECIALIST, IT MAY TAKE 1-2 WEEKS TO SCHEDULE/PROCESS THE REFERRAL. IF YOU HAVE NOT HEARD FROM US/SPECIALIST IN TWO WEEKS, PLEASE GIVE [place] A CALL AT [phone].   THE PATIENT IS ENCOURAGED TO PRACTICE SOCIAL DISTANCING DUE TO THE COVID-19 PANDEMIC.

## 2022-06-17 NOTE — Patient Instructions (Signed)

## 2022-06-17 NOTE — Progress Notes (Signed)
Subjective:   Linda Mccoy is a 71 y.o. female who presents for Medicare Annual (Subsequent) preventive examination.  Review of Systems     Cardiac Risk Factors include: advanced age (>21mn, >>31women);hypertension;obesity (BMI >30kg/m2)     Objective:    Today's Vitals   06/17/22 1409 06/17/22 1523  BP: (!) 142/90 140/84  Pulse: 90   Temp: 97.9 F (36.6 C)   TempSrc: Oral   SpO2: 97%   Weight: 252 lb 12.8 oz (114.7 kg)   Height: 5' 5.6" (1.666 m)    Body mass index is 41.3 kg/m.     06/17/2022    3:26 PM 06/11/2021   10:35 AM 06/04/2020   12:50 PM 10/30/2019   10:29 AM 11/13/2018    9:50 AM 05/23/2014    4:16 PM  Advanced Directives  Does Patient Have a Medical Advance Directive? No No No No Yes Patient does not have advance directive;Patient would not like information  Does patient want to make changes to medical advance directive?     Yes (MAU/Ambulatory/Procedural Areas - Information given)   Would patient like information on creating a medical advance directive? No - Patient declined  No - Patient declined Yes (MAU/Ambulatory/Procedural Areas - Information given)    Pre-existing out of facility DNR order (yellow form or pink MOST form)      No    Current Medications (verified) Outpatient Encounter Medications as of 06/17/2022  Medication Sig   aspirin EC 81 MG tablet Take 81 mg by mouth daily.   Blood Glucose Monitoring Suppl (TRUE METRIX METER) DEVI Use to check blood sugars once daily R73.03   Cholecalciferol (VITAMIN D) 2000 UNITS CAPS Take 2,000 Units by mouth daily.   glucose blood (TRUE METRIX BLOOD GLUCOSE TEST) test strip Use as instructed to check blood sugars once daily R73.03   lisinopril-hydrochlorothiazide (ZESTORETIC) 20-25 MG tablet TAKE 1 TABLET EVERY DAY   diclofenac Sodium (VOLTAREN) 1 % GEL Apply 2 g topically 4 (four) times daily. (Patient not taking: Reported on 06/17/2022)   Semaglutide-Weight Management (WEGOVY) 0.25 MG/0.5ML SOAJ Inject 0.25  mg into the skin once a week.   TRUEplus Lancets 33G MISC Check blood sugars once daily R73.03   [DISCONTINUED] metoprolol tartrate (LOPRESSOR) 25 MG tablet Take 1 tablet (25 mg total) by mouth 2 (two) times daily. (Patient not taking: Reported on 12/03/2020)   No facility-administered encounter medications on file as of 06/17/2022.    Allergies (verified) Penicillins   History: Past Medical History:  Diagnosis Date   Anemia    History   Borderline diabetes    no meds   Depression    no meds   Endometrial polyp 05/30/2014   Fibroids    Goiter    [facility]  Hypertension    Multiple thyroid nodules 11/13/2018   Obesity, unspecified 05/30/2014   Postmenopausal bleeding 05/30/2014   Prediabetes 02/12/2019   Sleep apnea    Does not use CPAP   SVD (spontaneous vaginal delivery)    x 3   Unspecified sleep apnea 05/30/2014   Vitamin D deficiency 11/29/2018   Past Surgical History:  Procedure Laterality Date   BREAST BIOPSY Right 01/08/2019   COLONOSCOPY     DILATATION & CURRETTAGE/HYSTEROSCOPY WITH RESECTOCOPE N/A 05/30/2014   Procedure: DILATATION & CURETTAGE/HYSTEROSCOPY WITH RESECTion of endometrial polyp;  Surgeon: AEna Dawley MD;  [facility];  Service: Gynecology;  Laterality: N/A;  TUBAL LIGATION     Family History  Problem Relation Age of Onset   Diabetes Mother    Hypertension Mother    Alcohol abuse Mother    Breast cancer Neg Hx    Social History   Socioeconomic History   Marital status: Widowed    Spouse name: Not on file   Number of children: Not on file   Years of education: Not on file   Highest education level: Not on file  Occupational History   Occupation: retired  Tobacco Use   Smoking status: Never   Smokeless tobacco: Never  Vaping Use   Vaping Use: Never used  Substance and Sexual Activity   Alcohol use: Not Currently   Drug use: No   Sexual activity: Not Currently    Partners: Male     Birth control/protection: Post-menopausal, Surgical    Comment: BTL   Other Topics Concern   Not on file  Social History Narrative   Not on file   Social Determinants of Health   Financial Resource Strain: Low Risk  (06/17/2022)   Overall Financial Resource Strain (CARDIA)    Difficulty of Paying Living Expenses: Not hard at all  Food Insecurity: No Food Insecurity (06/17/2022)   Hunger Vital Sign    Worried About Running Out of Food in the Last Year: Never true    [place] in the Last Year: Never true  Transportation Needs: No Transportation Needs (06/17/2022)   PRAPARE - Hydrologist (Medical): No    Lack of Transportation (Non-Medical): No  Physical Activity: Insufficiently Active (06/17/2022)   Exercise Vital Sign    Days of Exercise per Week: 2 days    Minutes of Exercise per Session: 40 min  Stress: No Stress Concern Present (06/17/2022)   [place]    Feeling of Stress : Not at all  Social Connections: Not on file    Tobacco Counseling Counseling given: Not Answered   Clinical Intake:  Pre-visit preparation completed: Yes  Pain : No/denies pain     Nutritional Status: BMI > 30  Obese Nutritional Risks: None Diabetes: No  How often do you need to have someone help you when you read instructions, pamphlets, or other written materials from your doctor or pharmacy?: 1 - Never What is the last grade level you completed in school?: 12th grade  Diabetic? no  Interpreter Needed?: No  Information entered by :: NAllen LPN   Activities of Daily Living    06/17/2022    3:31 PM  In your present state of health, do you have any difficulty performing the following activities:  Hearing? 1  Comment sometimes  Vision? 0  Difficulty concentrating or making decisions? 1  Walking or climbing stairs? 0  Dressing or bathing? 0  Doing errands, shopping? 0  Preparing Food  and eating ? N  Using the Toilet? N  In the past six months, have you accidently leaked urine? Y  Comment if held too long  Do you have problems with loss of bowel control? N  Managing your Medications? N  Managing your Finances? N  Housekeeping or managing your Housekeeping? N    Patient Care Team: Minette Brine, FNP as PCP - General ([place]) Jettie Booze, MD as PCP - Cardiology (Cardiology) Rex Kras, Claudette Stapler, RN as Case Manager  Indicate any recent Medical Services you may have received from other than [facility] providers in the  past year (date may be approximate).     Assessment:   This is a routine wellness examination for [place].  Hearing/Vision screen Vision Screening - Comments:: Regular eye exams, [facility]  Dietary issues and exercise activities discussed: Current Exercise Habits: Home exercise routine, Type of exercise: walking, Time (Minutes): 45, Frequency (Times/Week): 2, Weekly Exercise (Minutes/Week): 90   Goals Addressed             This Visit's Progress    Patient Stated       06/17/2022, no goals       Depression Screen    06/17/2022    3:26 PM 06/11/2021   10:36 AM 06/04/2020   12:51 PM 11/28/2019   10:02 AM 10/30/2019   10:33 AM 11/13/2018    9:02 AM  PHQ 2/9 Scores  PHQ - 2 Score 3 0 1 0 2 0  PHQ- 9 Score 8    3     Fall Risk    06/17/2022    3:26 PM 06/11/2021   10:36 AM 06/04/2020   12:51 PM 11/28/2019   10:02 AM 10/30/2019   10:33 AM  Fall Risk   Falls in the past year? 0 0 0 0 0  Number falls in past yr: 0      Injury with Fall? 0      Risk for fall due to : Medication side effect Medication side effect Medication side effect  Medication side effect  Follow up Falls evaluation completed;Education provided;Falls prevention discussed Falls evaluation completed;Education provided;Falls prevention discussed Falls evaluation completed;Education provided;Falls prevention discussed  Falls evaluation completed;Education  provided;Falls prevention discussed    FALL RISK PREVENTION PERTAINING TO THE HOME:  Any stairs in or around the home? No  If so, are there any without handrails?  N/a Home free of loose throw rugs in walkways, pet beds, electrical cords, etc? Yes  Adequate lighting in your home to reduce risk of falls? Yes   ASSISTIVE DEVICES UTILIZED TO PREVENT FALLS:  Life alert? No  Use of a cane, walker or w/c? No  Grab bars in the bathroom? No  Shower chair or bench in shower? No  Elevated toilet seat or a handicapped toilet? No   TIMED UP AND GO:  Was the test performed? No .    Gait slow and steady without use of assistive device  Cognitive Function:        06/17/2022    3:32 PM 06/11/2021   10:37 AM 06/04/2020   12:53 PM 10/30/2019   10:39 AM  6CIT Screen  What Year? 0 points 0 points 0 points 0 points  What month? 0 points 0 points 0 points 0 points  What time? 0 points 0 points 0 points 0 points  Count back from 20 0 points 0 points 0 points 0 points  Months in reverse 0 points 0 points 0 points 0 points  Repeat phrase 2 points 2 points 0 points 0 points  Total Score 2 points 2 points 0 points 0 points    Immunizations Immunization History  Administered Date(s) Administered   Fluad Quad(high Dose 65+) 08/11/2021   Influenza, High Dose Seasonal PF 11/13/2018   PFIZER(Purple Top)SARS-COV-2 Vaccination 01/25/2020, 02/19/2020, 10/07/2020, 04/16/2021, 09/10/2021   PNEUMOCOCCAL CONJUGATE-20 08/11/2021   Tdap 03/31/2022    TDAP status: Up to date  Flu Vaccine status: Up to date  Pneumococcal vaccine status: Up to date  Covid-19 vaccine status: Completed vaccines  Qualifies for Shingles Vaccine? Yes   Zostavax  completed No   Shingrix Completed?: No.    Education has been provided regarding the importance of this vaccine. Patient has been advised to call insurance company to determine out of pocket expense if they have not yet received this vaccine. Advised may also  receive vaccine at local pharmacy or Health Dept. Verbalized acceptance and understanding.  Screening Tests Health Maintenance  Topic Date Due   COVID-19 Vaccine (6 - Booster for Pfizer series) 11/05/2021   Zoster Vaccines- Shingrix (1 of 2) 06/30/2022 (Originally 10/18/1970)   INFLUENZA VACCINE  07/13/2022   MAMMOGRAM  03/19/2024   DEXA SCAN  03/19/2024   COLONOSCOPY (Pts 45-17yr Insurance coverage will need to be confirmed)  08/30/2029   TETANUS/TDAP  03/31/2032   Pneumonia Vaccine 71 Years old  Completed   Hepatitis C Screening  Completed   HPV VACCINES  Aged Out    Health Maintenance  Health Maintenance Due  Topic Date Due   COVID-19 Vaccine [place]) 11/05/2021    Colorectal cancer screening: Type of screening: Colonoscopy. Completed 08/31/2019. Repeat every 10 years  Mammogram status: Completed 03/19/2022. Repeat every year  Bone Density status: Completed 03/19/2022.   Lung Cancer Screening: (Low Dose CT Chest recommended if Age 69-80years, 30 pack-year currently smoking OR have quit w/in 15years.) does not qualify.   Lung Cancer Screening Referral: no  Additional Screening:  Hepatitis C Screening: does qualify; Completed 11/13/2018  Vision Screening: Recommended annual ophthalmology exams for early detection of glaucoma and other disorders of the eye. Is the patient up to date with their annual eye exam?  Yes  Who is the provider or what is the name of the office in which the patient attends annual eye exams? [facility] If pt is not established with a provider, would they like to be referred to a provider to establish care? No .   Dental Screening: Recommended annual dental exams for proper oral hygiene  Community Resource Referral / Chronic Care Management: CRR required this visit?  No   CCM required this visit?  No      Plan:     I have personally reviewed and noted the following in the patient's chart:   Medical and social  history Use of alcohol, tobacco or illicit drugs  Current medications and supplements including opioid prescriptions.  Functional ability and status Nutritional status Physical activity Advanced directives List of other physicians Hospitalizations, surgeries, and ER visits in previous 12 months Vitals Screenings to include cognitive, depression, and falls Referrals and appointments  In addition, I have reviewed and discussed with patient certain preventive protocols, quality metrics, and best practice recommendations. A written personalized care plan for preventive services as well as general preventive health recommendations were provided to patient.     NKellie Simmering LPN   [id number]  Nurse Notes: none

## 2022-06-18 LAB — BMP8+EGFR
BUN/Creatinine Ratio: 16 (ref 12–28)
BUN: 11 mg/dL (ref 8–27)
CO2: 26 mmol/L (ref 20–29)
Calcium: 9.5 mg/dL (ref 8.7–10.3)
Chloride: 95 mmol/L — ABNORMAL LOW (ref 96–106)
Creatinine, Ser: 0.67 mg/dL (ref 0.57–1.00)
Glucose: 84 mg/dL (ref 70–99)
Potassium: 4.2 mmol/L (ref 3.5–5.2)
Sodium: 136 mmol/L (ref 134–144)
eGFR: 95 mL/min/{1.73_m2} (ref >59)

## 2022-06-18 LAB — HEMOGLOBIN A1C
Est. average glucose Bld gHb Est-mCnc: 120 mg/dL
Hgb A1c MFr Bld: 5.8 % — ABNORMAL HIGH (ref 4.8–5.6)

## 2022-06-21 ENCOUNTER — Telehealth: Payer: Medicare PPO

## 2022-06-21 ENCOUNTER — Telehealth: Payer: Self-pay

## 2022-06-21 NOTE — Telephone Encounter (Signed)
  Care Management   Follow Up Note   06/21/2022 Name: Linda Mccoy MRN: [id number] DOB: 1951/02/01   Referred by: Minette Brine, FNP Reason for referral : Chronic Care Management (RN CM Follow up call )   An unsuccessful telephone outreach was attempted today. The patient was referred to the case management team for assistance with care management and care coordination.   Follow Up Plan: A HIPPA compliant phone message was left for the patient providing contact information and requesting a return call.   Barb Merino, RN, BSN, CCM Care Management Coordinator [place] [facility]  Direct Phone: [phone]

## 2022-07-05 ENCOUNTER — Ambulatory Visit: Payer: Self-pay

## 2022-07-05 NOTE — Chronic Care Management (AMB) (Deleted)
Care Management    RN Visit Note  07/05/2022 Name: Linda Mccoy MRN: [id number] DOB: Aug 06, 1951  Subjective: Linda Mccoy is a 71 y.o. year old female who is a primary care patient of Linda Mccoy, [place]. The care management team was consulted for assistance with disease management and care coordination needs.    {CCMTELEPHONEFACETOFACE:21091510} for {CCMINITIALFOLLOWUPCHOICE:21091511} in response to provider referral for case management and/or care coordination services.   Consent to Services:   [name] was given information about Care Management services today including:  Care Management services includes personalized support from designated clinical staff supervised by her physician, including individualized plan of care and coordination with other care providers 24/7 contact phone numbers for assistance for urgent and routine care needs. The patient may stop case management services at any time by phone call to the office staff.  Patient agreed to services and consent obtained.   Assessment: Review of patient past medical history, allergies, medications, health status, including review of consultants reports, laboratory and other test data, was performed as part of comprehensive evaluation and provision of chronic care management services.   SDOH (Social Determinants of Health) assessments and interventions performed:    Care Plan  Allergies  Allergen Reactions   Penicillins Hives    Outpatient Encounter Medications as of 07/05/2022  Medication Sig   aspirin EC 81 MG tablet Take 81 mg by mouth daily.   Blood Glucose Monitoring Suppl (TRUE METRIX METER) DEVI Use to check blood sugars once daily R73.03   Cholecalciferol (VITAMIN D) 2000 UNITS CAPS Take 2,000 Units by mouth daily.   diclofenac Sodium (VOLTAREN) 1 % GEL Apply 2 g topically 4 (four) times daily. (Patient not taking: Reported on 06/17/2022)   glucose blood (TRUE METRIX BLOOD GLUCOSE TEST) test strip Use as  instructed to check blood sugars once daily R73.03   lisinopril-hydrochlorothiazide (ZESTORETIC) 20-25 MG tablet TAKE 1 TABLET EVERY DAY   metoprolol tartrate (LOPRESSOR) 25 MG tablet Take 1 tablet (25 mg total) by mouth daily.   Semaglutide-Weight Management (WEGOVY) 0.25 MG/0.5ML SOAJ Inject 0.25 mg into the skin once a week.   TRUEplus Lancets 33G MISC Check blood sugars once daily R73.03   No facility-administered encounter medications on file as of 07/05/2022.    Patient Active Problem List   Diagnosis Date Noted   Palpitations 02/26/2020   Prediabetes 02/12/2019   Vitamin D deficiency 11/29/2018   Multiple thyroid nodules 11/13/2018   Postmenopausal bleeding 05/30/2014   Anemia 05/30/2014   Obesity, unspecified 05/30/2014   Unspecified sleep apnea 05/30/2014   Endometrial polyp 05/30/2014   Hypertension 02/05/2013    Conditions to be addressed/monitored: {CCM ASSESSMENT DZ OPTIONS:25047}  Care Plan : RN Care Manager Plan of Care  Updates made by Lynne Logan, RN since 07/05/2022 12:00 AM  Completed 07/05/2022   Problem: Chronic disease education and Care Coordination needs for HTN, Obesity, Prediabetes, Chronic pain Resolved 07/05/2022  Priority: High     Long-Range Goal: Assist with Chronic disease education and Care Coordination needs for HTN, Obesity, Prediabetes, Chronic pain Completed 07/05/2022  Start Date: 10/05/2021  Expected End Date: 10/05/2022  Recent Progress: On track  Priority: High  Note:   Current Barriers:  Knowledge Deficits related to plan of care for management of HTN, Obesity, Prediabetes, Chronic pain  Chronic Disease Management support and education needs related to  HTN, Obesity, Prediabetes, Chronic pain   RNCM Clinical Goal(s):  Patient will demonstrate Ongoing health management independence   continue to  work with Consulting civil engineer to address care management and care coordination needs related to   HTN, Obesity, Prediabetes, Chronic pain   will demonstrate ongoing self health care management ability    through collaboration with RN Care manager, provider, and care team.   Interventions: 1:1 collaboration with primary care provider regarding development and update of comprehensive plan of care as evidenced by provider attestation and co-signature Inter-disciplinary care team collaboration (see longitudinal plan of care) Evaluation of current treatment plan related to  self management and patient's adherence to plan as established by provider  Pain Interventions: (Status: Condition stable. Not addressed this visit.) Pain assessment performed Medications reviewed Reviewed provider established plan for pain management; Reviewed with patient prescribed pharmacological and nonpharmacological pain relief strategies; essential oils and moist heat (Peppermint) for pain management  Discussed plans with patient for ongoing care management follow up and provided patient with direct contact information for care management team  Hypertension Interventions:  (Status:  Goal Met.) Long Term Goal Last practice recorded BP readings:  BP Readings from Last 3 Encounters:  06/17/22 (!) 142/90  06/17/22 140/84  03/31/22 132/80  Most recent eGFR/CrCl:  Lab Results  Component Value Date   EGFR 95 06/17/2022    No components found for: "CRCL" Evaluation of current treatment plan related to hypertension self management and patient's adherence to plan as established by provider Review of patient status, including review of consultant's reports, relevant laboratory and other test results, and medications completed Reviewed medications with patient and discussed importance of medication adherence Determined patient had not been taking her Metoprolol but has resumed taking it   Diabetes Interventions:  (Status:  Goal Met.) Long Term Goal Assessed patient's understanding of A1c goal: <6.5% Provided education to patient about basic DM disease  process Review of patient status, including review of consultant's reports, relevant laboratory and other test results, and medications completed. Reviewed medications with patient and discussed importance of medication adherence Lab Results  Component Value Date   HGBA1C 5.8 (H) 06/17/2022  Impaired Urinary Elimination: (Status: Goal Met.) Short Term Goal  Evaluation of current treatment plan related to  Impaired Urinary Elimination , self-management and patient's adherence to plan as established by provider. Determined patient started Cranberry juice and AZO Cranberry, she is using as directed and reports her urinary frequency has improved Educated patient on importance of keeping record of triggers that may contribute to urinary frequency  Determined patient feels her symptoms have resolved, she denies having concerns at this time  Instructed patient to notify PCP promptly of any/all recurrent symptoms of impaired urinary elimination  Discussed plans with patient for ongoing care management follow up and provided patient with direct contact information for care management team   Obesity:  (Status:  Goal Met.)  Long Term Goal Evaluation of current treatment plan related to  Obesity ,  self-management and patient's adherence to plan as established by provider Discussed PCP referrals sent for Nutritionist and PREP (provider exercise referral program) Determined patient cannot afford the out of pocket copay for nutritional support through [facility], she plans to call [employer] to ask for in network providers for this service Determined the PREP referral has been authorized and is ready for scheduling by 07/17/22 per referral, patient advised  Body mass index is 47.26 kg/m Wt Readings from Last 3 Encounters: 06/17/22 242 lb (109.8 kg)  06/17/22 252 lb 12.8 oz (114.7 kg)  03/31/22 248 lb 6.4 oz (112.7 kg)  Patient Goals/Self-Care Activities: Take all medications  as prescribed Attend all  scheduled provider appointments Call pharmacy for medication refills 3-7 days in advance of running out of medications Perform all self care activities independently  Perform IADL's (shopping, preparing meals, housekeeping, managing finances) independently Call provider office for new concerns or questions  drink 6 to 8 glasses of water each day manage portion size check blood pressure 3 times per week take blood pressure log to all doctor appointments call doctor for signs and symptoms of high blood pressure take medications for blood pressure exactly as prescribed report new symptoms to your doctor  Follow Up Plan:  No further follow up required      Plan: {CM FOLLOW UP PLAN:25073}  SIG***

## 2022-07-05 NOTE — Patient Outreach (Signed)
Care Coordination   Follow Up Visit Note   07/05/2022 Name: Linda Mccoy MRN: [id number] DOB: Jan 01, 1951  Linda Mccoy is a 71 y.o. year old female who sees Minette Brine, [place] for primary care. I spoke with  Micah Noel by phone today  What matters to the patients health and wellness today?  To follow up with her health plan regarding an in network nutritionist  Pain Interventions: (Status: Condition stable. Not addressed this visit.) Pain assessment performed Medications reviewed Reviewed provider established plan for pain management; Reviewed with patient prescribed pharmacological and nonpharmacological pain relief strategies; essential oils and moist heat (Peppermint) for pain management  Discussed plans with patient for ongoing care management follow up and provided patient with direct contact information for care management team   Hypertension Interventions:  (Status:  Goal Met.) Long Term Goal Last practice recorded BP readings:     BP Readings from Last 3 Encounters:  06/17/22 (!) 142/90  06/17/22 140/84  03/31/22 132/80  Most recent eGFR/CrCl:       Lab Results  Component Value Date    EGFR 95 06/17/2022    No components found for: "CRCL" Evaluation of current treatment plan related to hypertension self management and patient's adherence to plan as established by provider Review of patient status, including review of consultant's reports, relevant laboratory and other test results, and medications completed Reviewed medications with patient and discussed importance of medication adherence Determined patient had not been taking her Metoprolol but has resumed taking it     Diabetes Interventions:  (Status:  Goal Met.) Long Term Goal Assessed patient's understanding of A1c goal: <6.5% Provided education to patient about basic DM disease process Review of patient status, including review of consultant's reports, relevant laboratory and other test results, and  medications completed. Reviewed medications with patient and discussed importance of medication adherence      Lab Results  Component Value Date    HGBA1C 5.8 (H) 06/17/2022  Impaired Urinary Elimination: (Status: Goal Met.) Short Term Goal  Evaluation of current treatment plan related to  Impaired Urinary Elimination , self-management and patient's adherence to plan as established by provider. Determined patient started Cranberry juice and AZO Cranberry, she is using as directed and reports her urinary frequency has improved Educated patient on importance of keeping record of triggers that may contribute to urinary frequency  Determined patient feels her symptoms have resolved, she denies having concerns at this time  Instructed patient to notify PCP promptly of any/all recurrent symptoms of impaired urinary elimination  Discussed plans with patient for ongoing care management follow up and provided patient with direct contact information for care management team    Obesity:  (Status:  Goal Met.)  Long Term Goal Evaluation of current treatment plan related to  Obesity ,  self-management and patient's adherence to plan as established by provider Discussed PCP referrals sent for Nutritionist and PREP (provider exercise referral program) Determined patient cannot afford the out of pocket copay for nutritional support through [facility], she plans to call [employer] to ask for in network providers for this service Determined the PREP referral has been authorized and is ready for scheduling by 07/17/22 per referral, patient advised  Body mass index is 47.26 kg/m Wt Readings from Last 3 Encounters: 06/17/22 242 lb (109.8 kg)  06/17/22 252 lb 12.8 oz (114.7 kg)  03/31/22 248 lb 6.4 oz (112.7 kg)    SDOH assessments and interventions completed:   Yes  Care Coordination Interventions  Activated:  Yes, provided Care Coordination Interventions:  Yes, provided  Follow up plan: No further intervention  required.  Encounter Outcome:  Pt. Visit Completed

## 2022-07-05 NOTE — Patient Instructions (Signed)
Visit Information  Thank you for taking time to visit with me today. Please don't hesitate to contact me if I can be of assistance to you.   Following are the goals we discussed today:  Take all medications as prescribed Attend all scheduled provider appointments Call pharmacy for medication refills 3-7 days in advance of running out of medications Perform all self care activities independently  Perform IADL's (shopping, preparing meals, housekeeping, managing finances) independently Call provider office for new concerns or questions  drink 6 to 8 glasses of water each day manage portion size check blood pressure 3 times per week take blood pressure log to all doctor appointments call doctor for signs and symptoms of high blood pressure take medications for blood pressure exactly as prescribed report new symptoms to your doctor Check with your health plan regarding in network nutritionist and let your PCP know if you need a new referral sent You should receive a call from someone regarding PREP (provider referral exercise program) to help get you started   If you are experiencing a Mental Health or [place] or need someone to talk to, please call 1-800-273-TALK (toll free, 24 hour hotline)   Patient verbalizes understanding of instructions and care plan provided today and agrees to view in [place]. Active MyChart status and patient understanding of how to access instructions and care plan via MyChart confirmed with patient.     No further follow up required  Barb Merino, RN, BSN, CCM Care Management Coordinator [facility]  Direct Phone: [phone]

## 2022-07-05 NOTE — Progress Notes (Signed)
This encounter was created in error - please disregard.

## 2022-07-09 NOTE — Progress Notes (Signed)
This encounter was created in error - please disregard.

## 2022-09-24 DIAGNOSIS — G4730 Sleep apnea, unspecified: Secondary | ICD-10-CM | POA: Diagnosis not present

## 2022-09-24 DIAGNOSIS — E559 Vitamin D deficiency, unspecified: Secondary | ICD-10-CM | POA: Diagnosis not present

## 2022-09-24 DIAGNOSIS — I10 Essential (primary) hypertension: Secondary | ICD-10-CM | POA: Diagnosis not present

## 2022-09-24 DIAGNOSIS — R300 Dysuria: Secondary | ICD-10-CM | POA: Diagnosis not present

## 2022-09-24 DIAGNOSIS — R002 Palpitations: Secondary | ICD-10-CM | POA: Diagnosis not present

## 2022-09-24 DIAGNOSIS — Z1159 Encounter for screening for other viral diseases: Secondary | ICD-10-CM | POA: Diagnosis not present

## 2022-09-24 DIAGNOSIS — Z79899 Other long term (current) drug therapy: Secondary | ICD-10-CM | POA: Diagnosis not present

## 2022-09-24 DIAGNOSIS — R7303 Prediabetes: Secondary | ICD-10-CM | POA: Diagnosis not present

## 2022-10-25 ENCOUNTER — Ambulatory Visit: Payer: Medicare PPO | Admitting: Nurse Practitioner

## 2022-12-27 ENCOUNTER — Ambulatory Visit (HOSPITAL_COMMUNITY)
Admission: RE | Admit: 2022-12-27 | Discharge: 2022-12-27 | Disposition: A | Discharge status: Home / Self Care | Payer: Medicare HMO | Source: Ambulatory Visit | Attending: Surgery | Admitting: Surgery

## 2022-12-27 ENCOUNTER — Other Ambulatory Visit (HOSPITAL_COMMUNITY): Payer: Self-pay | Admitting: Surgery

## 2022-12-27 DIAGNOSIS — I739 Peripheral vascular disease, unspecified: Secondary | ICD-10-CM | POA: Insufficient documentation

## 2022-12-27 LAB — VAS US ABI WITH/WO TBI

## 2022-12-27 NOTE — Progress Notes (Unsigned)
[facility] [place]  ASSESSMENT / PLAN: Linda Mccoy is a 72 y.o. female with atherosclerosis of native arteries of left lower extremities causing intermittent claudication.  Patient counseled patients with asymptomatic peripheral arterial disease or claudication have a 1-2% risk of developing chronic limb threatening ischemia, but a 15-30% risk of mortality in the next 5 years. Intervention should only be considered for medically optimized patients with disabling symptoms.   Recommend the following which can slow the progression of atherosclerosis and reduce the risk of major adverse cardiac / limb events:  Complete cessation from all tobacco products. Blood glucose control with goal A1c < 7%. Blood pressure control with goal blood pressure < 140/90 mmHg. Lipid reduction therapy with goal LDL-C <100 mg/dL (<70 if symptomatic from PAD).  Aspirin '81mg'$  PO QD.  Atorvastatin 40-'80mg'$  PO QD (or other "high intensity" statin therapy). Daily walking to and past the point of discomfort. Patient counseled to keep a log of exercise distance.  Very mild symptoms and reassuring clinical exam / non-invasive studies. Follow up in 1 year. Encouraged weight loss.   CHIEF COMPLAINT: cramping pain with walking  HISTORY OF PRESENT ILLNESS: Linda Mccoy is a 72 y.o. female who presents clinic for evaluation of left lower extremity discomfort.  The patient reports that she has pain over the lateral aspect of her left knee.  She points to the joint when describing her discomfort.  She also reports cramping discomfort in her left calf with walking.  This cramping discomfort tends to only occur with walking.  She notices the pain will go away after resting for several minutes.  She has no pain about the distal aspect of her feet.  She has no pain that wakes her from sleeping.  She has no ulcers about her feet.  Past Medical History:  Diagnosis Date   Anemia    History   Borderline  diabetes    no meds   Depression    no meds   Endometrial polyp 05/30/2014   Fibroids    Goiter    History of blood transfusion    approx 20 yrs ago at [facility]   Hypertension    Multiple thyroid nodules 11/13/2018   Obesity, unspecified 05/30/2014   Postmenopausal bleeding 05/30/2014   Prediabetes 02/12/2019   Sleep apnea    Does not use CPAP   SVD (spontaneous vaginal delivery)    x 3   Unspecified sleep apnea 05/30/2014   Vitamin D deficiency 11/29/2018    Past Surgical History:  Procedure Laterality Date   BREAST BIOPSY Right 01/08/2019   COLONOSCOPY     DILATATION & CURRETTAGE/HYSTEROSCOPY WITH RESECTOCOPE N/A 05/30/2014   Procedure: DILATATION & CURETTAGE/HYSTEROSCOPY WITH RESECTion of endometrial polyp;  Surgeon: Ena Dawley, MD;  Location: [facility];  Service: Gynecology;  Laterality: N/A;   TUBAL LIGATION      Family History  Problem Relation Age of Onset   Diabetes Mother    Hypertension Mother    Alcohol abuse Mother    Breast cancer Neg Hx     Social History   Socioeconomic History   Marital status: Widowed    Spouse name: Not on file   Number of children: Not on file   Years of education: Not on file   Highest education level: Not on file  Occupational History   Occupation: retired  Tobacco Use   Smoking status: Never   Smokeless tobacco: Never  Vaping Use   Vaping Use: Never used  Substance and Sexual Activity   Alcohol use: Not Currently   Drug use: No   Sexual activity: Not Currently    Partners: Male    Birth control/protection: Post-menopausal, Surgical    Comment: BTL   Other Topics Concern   Not on file  Social History Narrative   Not on file   Social Determinants of Health   Financial Resource Strain: Low Risk  (06/17/2022)   Overall Financial Resource Strain (CARDIA)    Difficulty of Paying Living Expenses: Not hard at all  Food Insecurity: No Food Insecurity (06/17/2022)   Hunger Vital Sign    Worried About Running Out of Food in the  Last Year: Never true    Ran Out of Food in the Last Year: Never true  Transportation Needs: No Transportation Needs (06/17/2022)   PRAPARE - Hydrologist (Medical): No    Lack of Transportation (Non-Medical): No  Physical Activity: Insufficiently Active (06/17/2022)   Exercise Vital Sign    Days of Exercise per Week: 2 days    Minutes of Exercise per Session: 40 min  Stress: No Stress Concern Present (06/17/2022)   Gray    Feeling of Stress : Not at all  Social Connections: Not on file  Intimate Partner Violence: Not At Risk (10/30/2019)   Humiliation, Afraid, Rape, and Kick questionnaire    Fear of Current or Ex-Partner: No    Emotionally Abused: No    Physically Abused: No    Sexually Abused: No    Allergies  Allergen Reactions   Penicillins Hives    Current Outpatient Medications  Medication Sig Dispense Refill   aspirin EC 81 MG tablet Take 81 mg by mouth daily.     Blood Glucose Monitoring Suppl (TRUE METRIX METER) DEVI Use to check blood sugars once daily R73.03 1 each 1   Cholecalciferol (VITAMIN D) 2000 UNITS CAPS Take 2,000 Units by mouth daily.     diclofenac Sodium (VOLTAREN) 1 % GEL Apply 2 g topically 4 (four) times daily. (Patient not taking: Reported on 06/17/2022) 100 g 2   glucose blood (TRUE METRIX BLOOD GLUCOSE TEST) test strip Use as instructed to check blood sugars once daily R73.03 100 each 12   lisinopril-hydrochlorothiazide (ZESTORETIC) 20-25 MG tablet TAKE 1 TABLET EVERY DAY 90 tablet 1   metoprolol tartrate (LOPRESSOR) 25 MG tablet Take 1 tablet (25 mg total) by mouth daily. 90 tablet 2   Semaglutide-Weight Management (WEGOVY) 0.25 MG/0.5ML SOAJ Inject 0.25 mg into the skin once a week. 2 mL 0   TRUEplus Lancets 33G MISC Check blood sugars once daily R73.03 100 each 1   No current facility-administered medications for this visit.    PHYSICAL EXAM There were  no vitals filed for this visit.  He is woman in no acute distress Regular rate and rhythm Unlabored breathing Palpable pedal pulses about the left lower extremity Weakly palpable right dorsalis pedis pulse  PERTINENT LABORATORY AND RADIOLOGIC DATA  Most recent CBC    Latest Ref Rng & Units 03/31/2022    3:19 PM 12/03/2020   11:09 AM 05/23/2014    3:40 PM  CBC  WBC 3.4 - 10.8 x10E3/uL 6.5  5.8  8.1   Hemoglobin 11.1 - 15.9 g/dL 13.1  13.2  11.3   Hematocrit 34.0 - 46.6 % 42.5  44.2  35.2   Platelets 150 - 450 x10E3/uL 318  338  270  Most recent CMP    Latest Ref Rng & Units 06/17/2022    4:16 PM 03/31/2022    3:19 PM 06/11/2021   12:13 PM  CMP  Glucose 70 - 99 mg/dL 84  91  93   BUN 8 - 27 mg/dL '11  10  11   '$ Creatinine 0.57 - 1.00 mg/dL 0.67  0.73  0.69   Sodium 134 - 144 mmol/L 136  140  137   Potassium 3.5 - 5.2 mmol/L 4.2  4.4  4.6   Chloride 96 - 106 mmol/L 95  99  96   CO2 20 - 29 mmol/L '26  26  27   '$ Calcium 8.7 - 10.3 mg/dL 9.5  10.1  9.7   Total Protein 6.0 - 8.5 g/dL  7.5  7.7   Total Bilirubin 0.0 - 1.2 mg/dL  0.3  0.6   Alkaline Phos 44 - 121 IU/L  88  87   AST 0 - 40 IU/L  17  19   ALT 0 - 32 IU/L  15  13     Renal function CrCl cannot be calculated (Patient's most recent lab result is older than the maximum 21 days allowed.).  Hgb A1c MFr Bld (%)  Date Value  06/17/2022 5.8 (H)    LDL Chol Calc (NIH)  Date Value Ref Range Status  03/31/2022 70 0 - 99 mg/dL Final     +-------+-----------+-----------+------------+------------+  ABI/TBIToday's ABIToday's TBIPrevious ABIPrevious TBI  +-------+-----------+-----------+------------+------------+  Right [place]         0.82                   [place]            +-------+-----------+-----------+------------+------------+  Left  [place]         0.76                                 +-------+-----------+-----------+------------+------------+   Yevonne Aline. Stanford Breed, MD FACS [facility] Phone Number: [phone] 12/27/2022 9:06 PM   Total time spent on preparing this encounter including chart review, data review, collecting history, examining the patient, coordinating care for this new patient, 60 minutes.  Portions of this report may have been transcribed using voice recognition software.  Every effort has been made to ensure accuracy; however, inadvertent computerized transcription errors may still be present.

## 2022-12-28 ENCOUNTER — Encounter: Payer: Self-pay | Admitting: Vascular Surgery

## 2022-12-28 ENCOUNTER — Ambulatory Visit: Payer: Medicare HMO | Admitting: Vascular Surgery

## 2022-12-28 VITALS — BP 138/78 | HR 75 | Temp 98.3°F | Resp 20 | Ht 60.0 in | Wt 245.0 lb

## 2022-12-28 DIAGNOSIS — I739 Peripheral vascular disease, unspecified: Secondary | ICD-10-CM | POA: Diagnosis not present

## 2022-12-30 ENCOUNTER — Other Ambulatory Visit: Payer: Self-pay | Admitting: Nurse Practitioner

## 2022-12-30 DIAGNOSIS — I10 Essential (primary) hypertension: Secondary | ICD-10-CM

## 2023-02-19 ENCOUNTER — Other Ambulatory Visit: Payer: Self-pay | Admitting: Nurse Practitioner

## 2023-02-19 DIAGNOSIS — I119 Hypertensive heart disease without heart failure: Secondary | ICD-10-CM

## 2023-02-19 DIAGNOSIS — Z87898 Personal history of other specified conditions: Secondary | ICD-10-CM

## 2023-04-04 ENCOUNTER — Encounter: Payer: Medicare PPO | Admitting: Nurse Practitioner

## 2023-04-07 ENCOUNTER — Encounter: Payer: Medicare HMO | Admitting: Nurse Practitioner

## 2023-06-22 ENCOUNTER — Ambulatory Visit: Payer: Medicare PPO | Admitting: Nurse Practitioner

## 2023-10-19 ENCOUNTER — Other Ambulatory Visit: Payer: Self-pay | Admitting: Nurse Practitioner

## 2023-10-19 DIAGNOSIS — I10 Essential (primary) hypertension: Secondary | ICD-10-CM

## 2024-01-21 ENCOUNTER — Other Ambulatory Visit: Payer: Self-pay | Admitting: Nurse Practitioner

## 2024-01-21 DIAGNOSIS — I119 Hypertensive heart disease without heart failure: Secondary | ICD-10-CM

## 2024-01-21 DIAGNOSIS — Z87898 Personal history of other specified conditions: Secondary | ICD-10-CM

## 2024-03-02 ENCOUNTER — Other Ambulatory Visit (HOSPITAL_COMMUNITY): Payer: Self-pay | Admitting: Cardiology

## 2024-03-02 ENCOUNTER — Other Ambulatory Visit: Payer: Self-pay

## 2024-03-02 ENCOUNTER — Encounter (HOSPITAL_COMMUNITY): Payer: Self-pay | Admitting: Cardiology

## 2024-03-02 DIAGNOSIS — I739 Peripheral vascular disease, unspecified: Secondary | ICD-10-CM

## 2024-03-02 DIAGNOSIS — I7143 Infrarenal abdominal aortic aneurysm, without rupture: Secondary | ICD-10-CM

## 2024-03-02 DIAGNOSIS — R079 Chest pain, unspecified: Secondary | ICD-10-CM

## 2024-03-02 DIAGNOSIS — I6523 Occlusion and stenosis of bilateral carotid arteries: Secondary | ICD-10-CM

## 2024-03-09 ENCOUNTER — Encounter (HOSPITAL_COMMUNITY): Payer: Self-pay

## 2024-03-09 ENCOUNTER — Encounter (HOSPITAL_COMMUNITY): Admission: RE | Admit: 2024-03-09 | Source: Ambulatory Visit

## 2024-03-12 NOTE — Progress Notes (Unsigned)
 [facility] [place]  ASSESSMENT / PLAN: [name] is a 73 y.o. female with atherosclerosis of native arteries of left lower extremities causing intermittent claudication.  Patient counseled patients with asymptomatic peripheral arterial disease or claudication have a 1-2% risk of developing chronic limb threatening ischemia, but a 15-30% risk of mortality in the next 5 years. Intervention should only be considered for medically optimized patients with disabling symptoms.   Recommend the following which can slow the progression of atherosclerosis and reduce the risk of major adverse cardiac / limb events:  Complete cessation from all tobacco products. Blood glucose control with goal A1c < 7%. Blood pressure control with goal blood pressure < 140/90 mmHg. Lipid reduction therapy with goal LDL-C <100 mg/dL (<19 if symptomatic from PAD).  Aspirin 81mg  PO QD.  Atorvastatin 40-80mg  PO QD (or other "high intensity" statin therapy). Daily walking to and past the point of discomfort. Patient counseled to keep a log of exercise distance.  Very mild symptoms and reassuring clinical exam / non-invasive studies. Follow up in 1 year. Encouraged weight loss.   CHIEF COMPLAINT: cramping pain with walking  HISTORY OF PRESENT ILLNESS: Linda Mccoy is a 73 y.o. female who presents clinic for evaluation of left lower extremity discomfort.  The patient reports that she has pain over the lateral aspect of her left knee.  She points to the joint when describing her discomfort.  She also reports cramping discomfort in her left calf with walking.  This cramping discomfort tends to only occur with walking.  She notices the pain will go away after resting for several minutes.  She has no pain about the distal aspect of her feet.  She has no pain that wakes her from sleeping.  She has no ulcers about her feet.  Past Medical History:  Diagnosis Date   Anemia    History   Borderline  diabetes    no meds   Depression    no meds   Endometrial polyp 05/30/2014   Fibroids    Goiter    History of blood transfusion    approx 20 yrs ago at [facility]   Hypertension    Multiple thyroid nodules 11/13/2018   Obesity, unspecified 05/30/2014   Postmenopausal bleeding 05/30/2014   Prediabetes 02/12/2019   Sleep apnea    Does not use CPAP   SVD (spontaneous vaginal delivery)    x 3   Unspecified sleep apnea 05/30/2014   Vitamin D deficiency 11/29/2018    Past Surgical History:  Procedure Laterality Date   BREAST BIOPSY Right 01/08/2019   COLONOSCOPY     DILATATION & CURRETTAGE/HYSTEROSCOPY WITH RESECTOCOPE N/A 05/30/2014   Procedure: DILATATION & CURETTAGE/HYSTEROSCOPY WITH RESECTion of endometrial polyp;  Surgeon: Kirkland Hun, MD;  Location: [facility];  Service: Gynecology;  Laterality: N/A;   TUBAL LIGATION      Family History  Problem Relation Age of Onset   Diabetes Mother    Hypertension Mother    Alcohol abuse Mother    Breast cancer Neg Hx     Social History   Socioeconomic History   Marital status: Widowed    Spouse name: Not on file   Number of children: Not on file   Years of education: Not on file   Highest education level: Not on file  Occupational History   Occupation: retired  Tobacco Use   Smoking status: Never   Smokeless tobacco: Never  Vaping Use   Vaping status: Never Used  Substance and Sexual Activity   Alcohol use: Not Currently   Drug use: No   Sexual activity: Not Currently    Partners: Male    Birth control/protection: Post-menopausal, Surgical    Comment: BTL   Other Topics Concern   Not on file  Social History Narrative   Not on file   Social Drivers of Health   Financial Resource Strain: Low Risk  (06/17/2022)   Overall Financial Resource Strain (CARDIA)    Difficulty of Paying Living Expenses: Not hard at all  Food Insecurity: No Food Insecurity (06/17/2022)   Hunger Vital Sign    Worried About Running Out of Food in the  Last Year: Never true    Ran Out of Food in the Last Year: Never true  Transportation Needs: No Transportation Needs (06/17/2022)   PRAPARE - Administrator, Civil Service (Medical): No    Lack of Transportation (Non-Medical): No  Physical Activity: Insufficiently Active (06/17/2022)   Exercise Vital Sign    Days of Exercise per Week: 2 days    Minutes of Exercise per Session: 40 min  Stress: No Stress Concern Present (06/17/2022)   [employer] of Occupational Health - Occupational Stress Questionnaire    Feeling of Stress : Not at all  Social Connections: Not on file  Intimate Partner Violence: Not At Risk (10/30/2019)   Humiliation, Afraid, Rape, and Kick questionnaire    Fear of Current or Ex-Partner: No    Emotionally Abused: No    Physically Abused: No    Sexually Abused: No    Allergies  Allergen Reactions   Penicillins Hives    Current Outpatient Medications  Medication Sig Dispense Refill   aspirin EC 81 MG tablet Take 81 mg by mouth daily.     Blood Glucose Monitoring Suppl (TRUE METRIX METER) DEVI Use to check blood sugars once daily R73.03 1 each 1   Cholecalciferol (VITAMIN D) 2000 UNITS CAPS Take 2,000 Units by mouth daily.     diclofenac Sodium (VOLTAREN) 1 % GEL Apply 2 g topically 4 (four) times daily. 100 g 2   glucose blood (TRUE METRIX BLOOD GLUCOSE TEST) test strip Use as instructed to check blood sugars once daily R73.03 100 each 12   lisinopril-hydrochlorothiazide (ZESTORETIC) 20-25 MG tablet TAKE 1 TABLET EVERY DAY 90 tablet 3   metoprolol tartrate (LOPRESSOR) 25 MG tablet TAKE 1 TABLET EVERY DAY 90 tablet 3   Semaglutide-Weight Management (WEGOVY) 0.25 MG/0.5ML SOAJ Inject 0.25 mg into the skin once a week. 2 mL 0   TRUEplus Lancets 33G MISC Check blood sugars once daily R73.03 100 each 1   No current facility-administered medications for this visit.    PHYSICAL EXAM There were no vitals filed for this visit.  He is woman in no acute  distress Regular rate and rhythm Unlabored breathing Palpable pedal pulses about the left lower extremity Weakly palpable right dorsalis pedis pulse  PERTINENT LABORATORY AND RADIOLOGIC DATA  Most recent CBC    Latest Ref Rng & Units 03/31/2022    3:19 PM 12/03/2020   11:09 AM 05/23/2014    3:40 PM  CBC  WBC 3.4 - 10.8 x10E3/uL 6.5  5.8  8.1   Hemoglobin 11.1 - 15.9 g/dL 16.1  09.6  04.5   Hematocrit 34.0 - 46.6 % 42.5  44.2  35.2   Platelets 150 - 450 x10E3/uL 318  338  270      Most recent CMP    Latest Ref Rng &  Units 06/17/2022    4:16 PM 03/31/2022    3:19 PM 06/11/2021   12:13 PM  CMP  Glucose 70 - 99 mg/dL 84  91  93   BUN 8 - 27 mg/dL 11  10  11    Creatinine 0.57 - 1.00 mg/dL 7.82  9.56  2.13   Sodium 134 - 144 mmol/L 136  140  137   Potassium 3.5 - 5.2 mmol/L 4.2  4.4  4.6   Chloride 96 - 106 mmol/L 95  99  96   CO2 20 - 29 mmol/L 26  26  27    Calcium 8.7 - 10.3 mg/dL 9.5  08.6  9.7   Total Protein 6.0 - 8.5 g/dL  7.5  7.7   Total Bilirubin 0.0 - 1.2 mg/dL  0.3  0.6   Alkaline Phos 44 - 121 IU/L  88  87   AST 0 - 40 IU/L  17  19   ALT 0 - 32 IU/L  15  13     Renal function CrCl cannot be calculated (Patient's most recent lab result is older than the maximum 21 days allowed.).  Hgb A1c MFr Bld (%)  Date Value  06/17/2022 5.8 (H)    LDL Chol Calc (NIH)  Date Value Ref Range Status  03/31/2022 70 0 - 99 mg/dL Final     +-------+-----------+-----------+------------+------------+  ABI/TBIToday's ABIToday's TBIPrevious ABIPrevious TBI  +-------+-----------+-----------+------------+------------+  Right [place]         0.82                   [place]            +-------+-----------+-----------+------------+------------+  Left  [place]         0.76                                 +-------+-----------+-----------+------------+------------+   Rande Brunt. Lenell Antu, MD FACS [facility] Phone Number: [phone] 03/12/2024  10:57 AM   Total time spent on preparing this encounter including chart review, data review, collecting history, examining the patient, coordinating care for this new patient, 60 minutes.  Portions of this report may have been transcribed using voice recognition software.  Every effort has been made to ensure accuracy; however, inadvertent computerized transcription errors may still be present.

## 2024-03-13 ENCOUNTER — Encounter: Payer: Self-pay | Admitting: Vascular Surgery

## 2024-03-13 ENCOUNTER — Ambulatory Visit: Payer: Medicare HMO | Admitting: Vascular Surgery

## 2024-03-13 ENCOUNTER — Ambulatory Visit (INDEPENDENT_AMBULATORY_CARE_PROVIDER_SITE_OTHER)
Admission: RE | Admit: 2024-03-13 | Discharge: 2024-03-13 | Disposition: A | Discharge status: Home / Self Care | Payer: Medicare HMO | Source: Ambulatory Visit | Attending: Vascular Surgery | Admitting: Vascular Surgery

## 2024-03-13 ENCOUNTER — Ambulatory Visit (HOSPITAL_COMMUNITY)
Admission: RE | Admit: 2024-03-13 | Discharge: 2024-03-13 | Disposition: A | Discharge status: Home / Self Care | Payer: Medicare HMO | Source: Ambulatory Visit | Attending: Vascular Surgery | Admitting: Vascular Surgery

## 2024-03-13 VITALS — BP 184/88 | HR 69 | Temp 97.7°F | Wt 250.0 lb

## 2024-03-13 DIAGNOSIS — I6523 Occlusion and stenosis of bilateral carotid arteries: Secondary | ICD-10-CM | POA: Diagnosis present

## 2024-03-13 DIAGNOSIS — I7143 Infrarenal abdominal aortic aneurysm, without rupture: Secondary | ICD-10-CM | POA: Diagnosis not present

## 2024-03-13 DIAGNOSIS — I739 Peripheral vascular disease, unspecified: Secondary | ICD-10-CM | POA: Insufficient documentation

## 2024-03-13 LAB — VAS US ABI WITH/WO TBI

## 2024-06-27 NOTE — Progress Notes (Unsigned)
 New Patient Pulmonology Office Visit   Subjective:  Patient ID: Linda Mccoy, female    DOB: [birth year]  MRN: [id number]  Referred by: Delores Rojelio Caldron, NP  CC: No chief complaint on file.   Linda Mccoy is a 73 y.o. female referred courtesy of Rojelio Delores, NP with concern of OSA.  Discussed the use of AI scribe software for clinical note transcription with the patient, who gave verbal consent to proceed. History of Present Illness History of Present Illness   Linda Mccoy is a 73 year old female who presents with sleep disturbances and possible sleep apnea. She was referred by Dr. Rojelio Delores to evaluate her sleep disturbances and adjunct therapy potential qualification for weight loss medication.  She experiences sleep disturbances, waking approximately four times a night, often to use the bathroom. She is unsure if she snores or experiences apnea during sleep. Despite sleeping, she feels tired during the day and sometimes falls asleep unintentionally while watching TV. She naps for a couple of hours most days.  She has a history of heart issues but no known lung or breathing problems. She recalls undergoing a sleep study years ago but did not receive any follow-up or results from it.  She has been diagnosed with neuropathy, particularly affecting her left leg, causing pain and discomfort, which contributes to her tossing and turning at night. She has a history of a large thyroid  gland, described as a goiter, which was evaluated years ago and was not cancerous.       Past Medical History:  Diagnosis Date   Anemia    History   Borderline diabetes    no meds   Depression    no meds   Endometrial polyp 05/30/2014   Fibroids    Goiter    History of blood transfusion    approx 20 yrs ago at [facility]   Hypertension    Multiple thyroid  nodules 11/13/2018   Obesity, unspecified 05/30/2014   Postmenopausal bleeding 05/30/2014   Prediabetes 02/12/2019   Sleep apnea    Does  not use CPAP   SVD (spontaneous vaginal delivery)    x 3   Unspecified sleep apnea 05/30/2014   Vitamin D  deficiency 11/29/2018      ROS-see HPI   +=positive Constitutional:    weight loss, night sweats, fevers, chills, fatigue, lassitude. HEENT:    headaches, +difficulty swallowing, tooth/dental problems, +sore throat,       sneezing, itching, ear ache, nasal congestion, post nasal drip, snoring CV:    chest pain, orthopnea, PND, swelling in lower extremities, anasarca,                                   dizziness, +palpitations Resp:   +shortness of breath with exertion or at rest.                productive cough,   non-productive cough, coughing up of blood.              change in color of mucus.  wheezing.   Skin:    rash or lesions. GI:  +heartburn, indigestion, abdominal pain, nausea, vomiting, diarrhea,                 change in bowel habits, loss of appetite GU: dysuria, change in color of urine, no urgency or frequency.   flank pain. MS:   joint pain, stiffness, decreased  range of motion, back pain. Neuro-     nothing unusual Psych:  change in mood or affect.  +depression or +anxiety.   memory loss.   Allergies: Penicillins  Current Outpatient Medications:    aspirin EC 81 MG tablet, Take 81 mg by mouth daily., Disp: , Rfl:    Blood Glucose Monitoring Suppl (TRUE METRIX METER) DEVI, Use to check blood sugars once daily R73.03, Disp: 1 each, Rfl: 1   Cholecalciferol (VITAMIN D ) 2000 UNITS CAPS, Take 2,000 Units by mouth daily., Disp: , Rfl:    diclofenac  Sodium (VOLTAREN ) 1 % GEL, Apply 2 g topically 4 (four) times daily., Disp: 100 g, Rfl: 2   glucose blood (TRUE METRIX BLOOD GLUCOSE TEST) test strip, Use as instructed to check blood sugars once daily R73.03, Disp: 100 each, Rfl: 12   lisinopril -hydrochlorothiazide  (ZESTORETIC ) 20-25 MG tablet, TAKE 1 TABLET EVERY DAY, Disp: 90 tablet, Rfl: 3   metoprolol  tartrate (LOPRESSOR ) 25 MG tablet, TAKE 1 TABLET EVERY DAY, Disp: 90  tablet, Rfl: 3   Semaglutide -Weight Management (WEGOVY ) 0.25 MG/0.5ML SOAJ, Inject 0.25 mg into the skin once a week., Disp: 2 mL, Rfl: 0   TRUEplus Lancets 33G MISC, Check blood sugars once daily R73.03, Disp: 100 each, Rfl: 1 Past Medical History:  Diagnosis Date   Anemia    History   Borderline diabetes    no meds   Depression    no meds   Endometrial polyp 05/30/2014   Fibroids    Goiter    History of blood transfusion    approx 20 yrs ago at [facility]   Hypertension    Multiple thyroid  nodules 11/13/2018   Obesity, unspecified 05/30/2014   Postmenopausal bleeding 05/30/2014   Prediabetes 02/12/2019   Sleep apnea    Does not use CPAP   SVD (spontaneous vaginal delivery)    x 3   Unspecified sleep apnea 05/30/2014   Vitamin D  deficiency 11/29/2018   Past Surgical History:  Procedure Laterality Date   BREAST BIOPSY Right 01/08/2019   COLONOSCOPY     DILATATION & CURRETTAGE/HYSTEROSCOPY WITH RESECTOCOPE N/A 05/30/2014   Procedure: DILATATION & CURETTAGE/HYSTEROSCOPY WITH RESECTion of endometrial polyp;  Surgeon: Rome Rigg, MD;  Location: [facility];  Service: Gynecology;  Laterality: N/A;   TUBAL LIGATION     Family History  Problem Relation Age of Onset   Diabetes Mother    Hypertension Mother    Alcohol abuse Mother    Breast cancer Neg Hx    Social History   Socioeconomic History   Marital status: Widowed    Spouse name: Not on file   Number of children: Not on file   Years of education: Not on file   Highest education level: Not on file  Occupational History   Occupation: retired  Tobacco Use   Smoking status: Never   Smokeless tobacco: Never  Vaping Use   Vaping status: Never Used  Substance and Sexual Activity   Alcohol use: Not Currently   Drug use: No   Sexual activity: Not Currently    Partners: Male    Birth control/protection: Post-menopausal, Surgical    Comment: BTL   Other Topics Concern   Not on file  Social History Narrative   Not on file    Social Drivers of Health   Financial Resource Strain: Low Risk  (06/17/2022)   Overall Financial Resource Strain (CARDIA)    Difficulty of Paying Living Expenses: Not hard at all  Food Insecurity: No Food Insecurity (06/17/2022)  Hunger Vital Sign    Worried About Running Out of Food in the Last Year: Never true    Ran Out of Food in the Last Year: Never true  Transportation Needs: No Transportation Needs (06/17/2022)   PRAPARE - Administrator, Civil Service (Medical): No    Lack of Transportation (Non-Medical): No  Physical Activity: Insufficiently Active (06/17/2022)   Exercise Vital Sign    Days of Exercise per Week: 2 days    Minutes of Exercise per Session: 40 min  Stress: No Stress Concern Present (06/17/2022)   [employer] of Occupational Health - Occupational Stress Questionnaire    Feeling of Stress : Not at all  Social Connections: Not on file  Intimate Partner Violence: Not At Risk (10/30/2019)   Humiliation, Afraid, Rape, and Kick questionnaire    Fear of Current or Ex-Partner: No    Emotionally Abused: No    Physically Abused: No    Sexually Abused: No       Objective:  LMP 12/26/2010  Wt Readings from Last 3 Encounters:  06/28/24 257 lb 3.2 oz (116.7 kg)  03/13/24 250 lb (113.4 kg)  12/28/22 245 lb (111.1 kg)   SpO2 Readings from Last 3 Encounters:  06/28/24 98%  03/13/24 94%  12/28/22 97%   OBJ- Physical Exam General- Alert, Oriented, Affect-appropriate, Distress- none acute, +obese Skin- rash-none, lesions- none, excoriation- none Lymphadenopathy- none Head- atraumatic            Eyes- Gross vision intact, PERRLA, conjunctivae and secretions clear            Ears- Hearing, canals-normal            Nose- Clear, no-Septal dev, mucus, polyps, erosion, perforation             Throat- Mallampati IV , mucosa clear , drainage- none, tonsils- atrophic Neck- flexible , trachea midline, no stridor , thyroid +Goiter, carotid no bruit Chest -  symmetrical excursion , unlabored           Heart/CV- RRR , no murmur , no gallop  , no rub, nl s1 s2                           - JVD- none , edema- none, stasis changes- none, varices- none           Lung- clear to P&A, wheeze- none, cough- none , dullness-none, rub- none           Chest wall-  Abd-  Br/ Gen/ Rectal- Not done, not indicated Extrem- cyanosis- none, clubbing, none, atrophy- none, strength- nl Neuro- grossly intact to observation       Assessment & Plan:   Assessment & Plan  Assessment and Plan:    Sleep Apnea- suspected Suspected due to frequent awakenings and daytime fatigue. Discussed potential long-term health risks. - Order home sleep study. - Discussed CPAP, oral appliances, and surgery; CPAP likely  preferred initial treatment. - Instructed to call office two weeks post-study for results.  Neuropathy Chronic neuropathy in left leg with pain, tingling, and numbness.  Goiter Stable goiter with no malignancy or symptoms.  Weight Management Interest in weight management and medication use. - Discussed new FDA-approved injectable medication, pending insurance approval and evaluation.       Reggy Salt, MD

## 2024-06-28 ENCOUNTER — Ambulatory Visit (INDEPENDENT_AMBULATORY_CARE_PROVIDER_SITE_OTHER): Admitting: Internal Medicine

## 2024-06-28 ENCOUNTER — Encounter: Payer: Self-pay | Admitting: Internal Medicine

## 2024-06-28 VITALS — BP 142/78 | HR 86 | Temp 97.8°F | Ht 64.0 in | Wt 257.2 lb

## 2024-06-28 DIAGNOSIS — G4733 Obstructive sleep apnea (adult) (pediatric): Secondary | ICD-10-CM

## 2024-06-28 NOTE — Patient Instructions (Signed)
 Order- home sleep test      dx OSA  Please call us  about weeks after your sleep test for results and recommendations

## 2024-09-26 NOTE — Progress Notes (Deleted)
 New Patient Pulmonology Office Visit   Subjective:  Patient ID: Linda Mccoy, female    DOB: 14-Jun-1951  MRN: [id number]  Referred by: Delores Rojelio Caldron, NP  CC: No chief complaint on file.   Linda Mccoy is a 73 y.o. female referred courtesy of Rojelio Delores, NP with concern of OSA.  Discussed the use of AI scribe software for clinical note transcription with the patient, who gave verbal consent to proceed. History of Present Illness   Linda Mccoy is a 73 year old female who presents with sleep disturbances and possible sleep apnea. She was referred by Dr. Rojelio Delores to evaluate her sleep disturbances and adjunct therapy potential qualification for weight loss medication.  She experiences sleep disturbances, waking approximately four times a night, often to use the bathroom. She is unsure if she snores or experiences apnea during sleep. Despite sleeping, she feels tired during the day and sometimes falls asleep unintentionally while watching TV. She naps for a couple of hours most days.  She has a history of heart issues but no known lung or breathing problems. She recalls undergoing a sleep study years ago but did not receive any follow-up or results from it.  She has been diagnosed with neuropathy, particularly affecting her left leg, causing pain and discomfort, which contributes to her tossing and turning at night. She has a history of a large thyroid  gland, described as a goiter, which was evaluated years ago and was not cancerous.       Past Medical History:  Diagnosis Date   Anemia    History   Borderline diabetes    no meds   Depression    no meds   Endometrial polyp 05/30/2014   Fibroids    Goiter    History of blood transfusion    approx 20 yrs ago at [facility]   Hypertension    Multiple thyroid  nodules 11/13/2018   Obesity, unspecified 05/30/2014   Postmenopausal bleeding 05/30/2014   Prediabetes 02/12/2019   Sleep apnea    Does not use CPAP   SVD  (spontaneous vaginal delivery)    x 3   Unspecified sleep apnea 05/30/2014   Vitamin D  deficiency 11/29/2018    Allergies: Penicillins  Current Outpatient Medications:    aspirin EC 81 MG tablet, Take 81 mg by mouth daily., Disp: , Rfl:    Blood Glucose Monitoring Suppl (TRUE METRIX METER) DEVI, Use to check blood sugars once daily R73.03, Disp: 1 each, Rfl: 1   Cholecalciferol (VITAMIN D ) 2000 UNITS CAPS, Take 2,000 Units by mouth daily., Disp: , Rfl:    diclofenac  Sodium (VOLTAREN ) 1 % GEL, Apply 2 g topically 4 (four) times daily., Disp: 100 g, Rfl: 2   glucose blood (TRUE METRIX BLOOD GLUCOSE TEST) test strip, Use as instructed to check blood sugars once daily R73.03, Disp: 100 each, Rfl: 12   lisinopril -hydrochlorothiazide  (ZESTORETIC ) 20-25 MG tablet, TAKE 1 TABLET EVERY DAY, Disp: 90 tablet, Rfl: 3   metoprolol  tartrate (LOPRESSOR ) 25 MG tablet, TAKE 1 TABLET EVERY DAY, Disp: 90 tablet, Rfl: 3   Semaglutide -Weight Management (WEGOVY ) 0.25 MG/0.5ML SOAJ, Inject 0.25 mg into the skin once a week., Disp: 2 mL, Rfl: 0   TRUEplus Lancets 33G MISC, Check blood sugars once daily R73.03, Disp: 100 each, Rfl: 1 Past Medical History:  Diagnosis Date   Anemia    History   Borderline diabetes    no meds   Depression    no meds  Endometrial polyp 05/30/2014   Fibroids    Goiter    History of blood transfusion    approx 20 yrs ago at [facility]   Hypertension    Multiple thyroid  nodules 11/13/2018   Obesity, unspecified 05/30/2014   Postmenopausal bleeding 05/30/2014   Prediabetes 02/12/2019   Sleep apnea    Does not use CPAP   SVD (spontaneous vaginal delivery)    x 3   Unspecified sleep apnea 05/30/2014   Vitamin D  deficiency 11/29/2018   Past Surgical History:  Procedure Laterality Date   BREAST BIOPSY Right 01/08/2019   COLONOSCOPY     DILATATION & CURRETTAGE/HYSTEROSCOPY WITH RESECTOCOPE N/A 05/30/2014   Procedure: DILATATION & CURETTAGE/HYSTEROSCOPY WITH RESECTion of endometrial  polyp;  Surgeon: Rome Rigg, MD;  Location: [facility];  Service: Gynecology;  Laterality: N/A;   TUBAL LIGATION     Family History  Problem Relation Age of Onset   Diabetes Mother    Hypertension Mother    Alcohol abuse Mother    Breast cancer Neg Hx    Social History   Socioeconomic History   Marital status: Widowed    Spouse name: Not on file   Number of children: Not on file   Years of education: Not on file   Highest education level: Not on file  Occupational History   Occupation: retired  Tobacco Use   Smoking status: Never   Smokeless tobacco: Never  Vaping Use   Vaping status: Never Used  Substance and Sexual Activity   Alcohol use: Not Currently   Drug use: No   Sexual activity: Not Currently    Partners: Male    Birth control/protection: Post-menopausal, Surgical    Comment: BTL   Other Topics Concern   Not on file  Social History Narrative   Not on file   Social Drivers of Health   Financial Resource Strain: Low Risk  (06/17/2022)   Overall Financial Resource Strain (CARDIA)    Difficulty of Paying Living Expenses: Not hard at all  Food Insecurity: No Food Insecurity (06/17/2022)   Hunger Vital Sign    Worried About Running Out of Food in the Last Year: Never true    Ran Out of Food in the Last Year: Never true  Transportation Needs: No Transportation Needs (06/17/2022)   PRAPARE - Administrator, Civil Service (Medical): No    Lack of Transportation (Non-Medical): No  Physical Activity: Insufficiently Active (06/17/2022)   Exercise Vital Sign    Days of Exercise per Week: 2 days    Minutes of Exercise per Session: 40 min  Stress: No Stress Concern Present (06/17/2022)   [employer] of Occupational Health - Occupational Stress Questionnaire    Feeling of Stress : Not at all  Social Connections: Not on file  Intimate Partner Violence: Not At Risk (10/30/2019)   Humiliation, Afraid, Rape, and Kick questionnaire    Fear of Current or  Ex-Partner: No    Emotionally Abused: No    Physically Abused: No    Sexually Abused: No       Objective:  LMP 12/26/2010  Wt Readings from Last 3 Encounters:  06/28/24 257 lb 3.2 oz (116.7 kg)  03/13/24 250 lb (113.4 kg)  12/28/22 245 lb (111.1 kg)   SpO2 Readings from Last 3 Encounters:  06/28/24 98%  03/13/24 94%  12/28/22 97%   Assessment and Plan:    Sleep Apnea- suspected Suspected due to frequent awakenings and daytime fatigue. Discussed potential long-term health risks. -  Order home sleep study. - Discussed CPAP, oral appliances, and surgery; CPAP likely  preferred initial treatment. - Instructed to call office two weeks post-study for results.  Neuropathy Chronic neuropathy in left leg with pain, tingling, and numbness.  Goiter Stable goiter with no malignancy or symptoms.  Weight Management Interest in weight management and medication use. - Discussed new FDA-approved injectable medication, pending insurance approval and evaluation.   09/27/24- 72 yoF followed for suspected OSA with fragmented sleep, daytime somnolence, complicated by peripheral Neuropathy Was to have HST- not done?      OBJ- Physical Exam General- Alert, Oriented, Affect-appropriate, Distress- none acute, +obese Skin- rash-none, lesions- none, excoriation- none Lymphadenopathy- none Head- atraumatic            Eyes- Gross vision intact, PERRLA, conjunctivae and secretions clear            Ears- Hearing, canals-normal            Nose- Clear, no-Septal dev, mucus, polyps, erosion, perforation             Throat- Mallampati IV , mucosa clear , drainage- none, tonsils- atrophic Neck- flexible , trachea midline, no stridor , thyroid +Goiter, carotid no bruit Chest - symmetrical excursion , unlabored           Heart/CV- RRR , no murmur , no gallop  , no rub, nl s1 s2                           - JVD- none , edema- none, stasis changes- none, varices- none           Lung- clear to P&A,  wheeze- none, cough- none , dullness-none, rub- none           Chest wall-  Abd-  Br/ Gen/ Rectal- Not done, not indicated Extrem- cyanosis- none, clubbing, none, atrophy- none, strength- nl Neuro- grossly intact to observation       Assessment & Plan:

## 2024-09-27 ENCOUNTER — Ambulatory Visit: Admitting: Internal Medicine

## 2024-11-11 ENCOUNTER — Other Ambulatory Visit: Payer: Self-pay | Admitting: Nurse Practitioner

## 2024-11-11 DIAGNOSIS — Z87898 Personal history of other specified conditions: Secondary | ICD-10-CM

## 2024-11-11 DIAGNOSIS — I119 Hypertensive heart disease without heart failure: Secondary | ICD-10-CM
